# Patient Record
Sex: Female | Born: 1960 | Race: White | Hispanic: No | Smoking: Current some day smoker
Health system: Southern US, Community
[De-identification: ages and names within clinical notes are randomized; demographics above are authoritative.]

## PROBLEM LIST (undated history)

## (undated) DIAGNOSIS — F329 Major depressive disorder, single episode, unspecified: Secondary | ICD-10-CM

## (undated) DIAGNOSIS — F319 Bipolar disorder, unspecified: Secondary | ICD-10-CM

## (undated) DIAGNOSIS — Z124 Encounter for screening for malignant neoplasm of cervix: Secondary | ICD-10-CM

## (undated) DIAGNOSIS — I2699 Other pulmonary embolism without acute cor pulmonale: Secondary | ICD-10-CM

## (undated) DIAGNOSIS — Z720 Tobacco use: Secondary | ICD-10-CM

## (undated) DIAGNOSIS — F32A Depression, unspecified: Secondary | ICD-10-CM

## (undated) DIAGNOSIS — G8929 Other chronic pain: Secondary | ICD-10-CM

## (undated) DIAGNOSIS — F419 Anxiety disorder, unspecified: Secondary | ICD-10-CM

## (undated) DIAGNOSIS — M810 Age-related osteoporosis without current pathological fracture: Secondary | ICD-10-CM

## (undated) DIAGNOSIS — I639 Cerebral infarction, unspecified: Secondary | ICD-10-CM

## (undated) DIAGNOSIS — M109 Gout, unspecified: Secondary | ICD-10-CM

## (undated) DIAGNOSIS — E559 Vitamin D deficiency, unspecified: Secondary | ICD-10-CM

## (undated) HISTORY — DX: Other chronic pain: G89.29

## (undated) HISTORY — DX: Bipolar disorder, unspecified: F31.9

## (undated) HISTORY — DX: Major depressive disorder, single episode, unspecified: F32.9

## (undated) HISTORY — DX: Age-related osteoporosis without current pathological fracture: M81.0

## (undated) HISTORY — DX: Cerebral infarction, unspecified: I63.9

## (undated) HISTORY — DX: Anxiety disorder, unspecified: F41.9

## (undated) HISTORY — DX: Vitamin D deficiency, unspecified: E55.9

## (undated) HISTORY — DX: Other pulmonary embolism without acute cor pulmonale: I26.99

## (undated) HISTORY — DX: Depression, unspecified: F32.A

## (undated) HISTORY — DX: Encounter for screening for malignant neoplasm of cervix: Z12.4

## (undated) HISTORY — DX: Gout, unspecified: M10.9

## (undated) HISTORY — PX: APPENDECTOMY: SHX54

## (undated) HISTORY — DX: Tobacco use: Z72.0

---

## 1998-07-09 ENCOUNTER — Emergency Department (HOSPITAL_COMMUNITY): Admission: EM | Admit: 1998-07-09 | Discharge: 1998-07-09 | Payer: Self-pay | Admitting: Emergency Medicine

## 1998-08-12 ENCOUNTER — Emergency Department (HOSPITAL_COMMUNITY): Admission: EM | Admit: 1998-08-12 | Discharge: 1998-08-12 | Payer: Self-pay | Admitting: Emergency Medicine

## 1998-10-13 ENCOUNTER — Emergency Department (HOSPITAL_COMMUNITY): Admission: EM | Admit: 1998-10-13 | Discharge: 1998-10-13 | Payer: Self-pay | Admitting: Emergency Medicine

## 1998-11-25 HISTORY — PX: TOTAL HIP ARTHROPLASTY: SHX124

## 1998-11-27 ENCOUNTER — Emergency Department (HOSPITAL_COMMUNITY): Admission: EM | Admit: 1998-11-27 | Discharge: 1998-11-27 | Payer: Self-pay | Admitting: Emergency Medicine

## 1999-07-22 DIAGNOSIS — I639 Cerebral infarction, unspecified: Secondary | ICD-10-CM

## 1999-07-22 HISTORY — DX: Cerebral infarction, unspecified: I63.9

## 1999-10-07 ENCOUNTER — Emergency Department (HOSPITAL_COMMUNITY): Admission: EM | Admit: 1999-10-07 | Discharge: 1999-10-07 | Payer: Self-pay | Admitting: Emergency Medicine

## 2000-03-26 HISTORY — PX: PARTIAL HIP ARTHROPLASTY: SHX733

## 2001-02-18 ENCOUNTER — Emergency Department (HOSPITAL_COMMUNITY): Admission: EM | Admit: 2001-02-18 | Discharge: 2001-02-18 | Payer: Self-pay | Admitting: Emergency Medicine

## 2005-06-19 ENCOUNTER — Emergency Department (HOSPITAL_COMMUNITY): Admission: EM | Admit: 2005-06-19 | Discharge: 2005-06-19 | Payer: Self-pay | Admitting: Emergency Medicine

## 2007-07-22 ENCOUNTER — Inpatient Hospital Stay (HOSPITAL_COMMUNITY): Admission: EM | Admit: 2007-07-22 | Discharge: 2007-07-24 | Payer: Self-pay | Admitting: Emergency Medicine

## 2007-07-23 ENCOUNTER — Ambulatory Visit: Payer: Self-pay | Admitting: Physical Medicine & Rehabilitation

## 2007-07-23 ENCOUNTER — Encounter (INDEPENDENT_AMBULATORY_CARE_PROVIDER_SITE_OTHER): Payer: Self-pay | Admitting: Internal Medicine

## 2007-07-24 ENCOUNTER — Inpatient Hospital Stay (HOSPITAL_COMMUNITY)
Admission: RE | Admit: 2007-07-24 | Discharge: 2007-08-13 | Payer: Self-pay | Admitting: Physical Medicine & Rehabilitation

## 2007-07-24 ENCOUNTER — Ambulatory Visit: Payer: Self-pay | Admitting: Physical Medicine & Rehabilitation

## 2007-08-23 ENCOUNTER — Encounter: Admission: RE | Admit: 2007-08-23 | Discharge: 2007-08-23 | Payer: Self-pay | Admitting: *Deleted

## 2007-12-17 ENCOUNTER — Encounter
Admission: RE | Admit: 2007-12-17 | Discharge: 2007-12-19 | Payer: Self-pay | Admitting: Physical Medicine & Rehabilitation

## 2007-12-19 ENCOUNTER — Ambulatory Visit: Payer: Self-pay | Admitting: Physical Medicine & Rehabilitation

## 2008-01-04 ENCOUNTER — Emergency Department (HOSPITAL_COMMUNITY): Admission: EM | Admit: 2008-01-04 | Discharge: 2008-01-04 | Payer: Self-pay | Admitting: Emergency Medicine

## 2008-03-09 ENCOUNTER — Encounter: Admission: RE | Admit: 2008-03-09 | Discharge: 2008-04-15 | Payer: Self-pay | Admitting: Family Medicine

## 2008-03-10 ENCOUNTER — Ambulatory Visit (HOSPITAL_COMMUNITY): Admission: RE | Admit: 2008-03-10 | Discharge: 2008-03-10 | Payer: Self-pay | Admitting: Family Medicine

## 2008-03-10 ENCOUNTER — Ambulatory Visit: Payer: Self-pay | Admitting: Vascular Surgery

## 2008-03-10 ENCOUNTER — Encounter (INDEPENDENT_AMBULATORY_CARE_PROVIDER_SITE_OTHER): Payer: Self-pay | Admitting: Family Medicine

## 2008-03-20 ENCOUNTER — Inpatient Hospital Stay (HOSPITAL_COMMUNITY): Admission: EM | Admit: 2008-03-20 | Discharge: 2008-03-21 | Payer: Self-pay | Admitting: Emergency Medicine

## 2008-03-31 ENCOUNTER — Ambulatory Visit: Payer: Self-pay | Admitting: Vascular Surgery

## 2008-03-31 ENCOUNTER — Emergency Department (HOSPITAL_COMMUNITY): Admission: EM | Admit: 2008-03-31 | Discharge: 2008-03-31 | Payer: Self-pay | Admitting: Emergency Medicine

## 2008-04-14 ENCOUNTER — Encounter: Admission: RE | Admit: 2008-04-14 | Discharge: 2008-04-14 | Payer: Self-pay | Admitting: Family Medicine

## 2008-04-24 ENCOUNTER — Ambulatory Visit: Payer: Self-pay | Admitting: Internal Medicine

## 2008-04-24 ENCOUNTER — Encounter: Admission: RE | Admit: 2008-04-24 | Discharge: 2008-04-24 | Payer: Self-pay | Admitting: Sports Medicine

## 2008-04-24 ENCOUNTER — Inpatient Hospital Stay (HOSPITAL_COMMUNITY): Admission: AD | Admit: 2008-04-24 | Discharge: 2008-04-29 | Payer: Self-pay | Admitting: Orthopedic Surgery

## 2008-04-26 ENCOUNTER — Encounter (INDEPENDENT_AMBULATORY_CARE_PROVIDER_SITE_OTHER): Payer: Self-pay | Admitting: Orthopedic Surgery

## 2008-04-26 ENCOUNTER — Ambulatory Visit: Payer: Self-pay | Admitting: Vascular Surgery

## 2008-04-28 ENCOUNTER — Ambulatory Visit: Payer: Self-pay | Admitting: Physical Medicine & Rehabilitation

## 2008-04-29 ENCOUNTER — Inpatient Hospital Stay (HOSPITAL_COMMUNITY)
Admission: RE | Admit: 2008-04-29 | Discharge: 2008-05-18 | Payer: Self-pay | Admitting: Physical Medicine & Rehabilitation

## 2009-06-29 ENCOUNTER — Encounter (INDEPENDENT_AMBULATORY_CARE_PROVIDER_SITE_OTHER): Payer: Self-pay | Admitting: Emergency Medicine

## 2009-06-29 ENCOUNTER — Ambulatory Visit: Payer: Self-pay | Admitting: Surgery

## 2009-06-29 ENCOUNTER — Observation Stay (HOSPITAL_COMMUNITY): Admission: EM | Admit: 2009-06-29 | Discharge: 2009-06-29 | Payer: Self-pay | Admitting: Emergency Medicine

## 2009-11-16 ENCOUNTER — Ambulatory Visit: Payer: Self-pay | Admitting: Vascular Surgery

## 2010-06-24 ENCOUNTER — Ambulatory Visit: Payer: Self-pay | Admitting: Vascular Surgery

## 2010-09-10 LAB — CBC
MCV: 99.3 fL (ref 78.0–100.0)
RDW: 13.4 % (ref 11.5–15.5)
WBC: 5.9 10*3/uL (ref 4.0–10.5)

## 2010-09-10 LAB — BASIC METABOLIC PANEL
CO2: 30 mEq/L (ref 19–32)
Chloride: 103 mEq/L (ref 96–112)
Creatinine, Ser: 0.6 mg/dL (ref 0.4–1.2)
GFR calc non Af Amer: >60 mL/min (ref >60)
Potassium: 3.7 mEq/L (ref 3.5–5.1)
Sodium: 138 mEq/L (ref 135–145)

## 2010-09-10 LAB — APTT: aPTT: 41 seconds — ABNORMAL HIGH (ref 24–37)

## 2010-09-10 LAB — PROTIME-INR: INR: 1.07 (ref 0.00–1.49)

## 2010-11-08 NOTE — Op Note (Signed)
NAME:  [name] [name], [name] [name]                ACCOUNT NO.:  [id number]   MEDICAL RECORD NO.:  [id number]          PATIENT TYPE:  INP   LOCATION:  5019                         FACILITY:  [facility]   PHYSICIAN:  Dyke Brackett, M.D.    DATE OF BIRTH:  01-04-1961   DATE OF PROCEDURE:  04/24/2008  DATE OF DISCHARGE:                               OPERATIVE REPORT   INDICATIONS FOR PROCEDURE:  This is a 50 year old female with displaced  right femoral neck fracture thought to be amenable to surgery.   PREOPERATIVE DIAGNOSIS:  Displaced right femoral neck fracture.   POSTOPERATIVE DIAGNOSIS:  Displaced right femoral neck fracture.   OPERATION:  Right hip hemiarthroplasty (DePuy Prodigy 13.5 mm small  stature stem, +10 mm neck length with 45 mm hip ball).   SURGEON:  Dyke Brackett, M.D.   ASSISTANTKatrinka Blazing, PA.   BLOOD LOSS:  Approximately 150.   DESCRIPTION OF PROCEDURE:  Sterile prep and drape, posterior approach to  the hip made with splitting iliotibial band.  T capsulotomy made in the  hip.  Head was delivered, appeared to be no undue pathologic process.  The 45 mm hip ball acetabular size was judged based on sizing the head.  This was followed by progressive reaming and rasping to 13 mm to set the  13.5 mm stem with the appropriate size rasp, trialed with the rasp.  Probably due to the patient's stroke and the low nature of the fracture  relative to the neck the maximal neck length was used with good  stability with the Prodigy stem.  Final components were inserted with  the above mentioned lengths without difficulty.  There was very little  tendency to dislocate with extremes of flexion and internal rotation,  adduction with the stability was deemed to be excellent.  T capsulotomy  was closed with Ethibond, the iliotibial band with Ethibond and 2-0  Vicryl, skin clips, Marcaine with epinephrine skin lightly compressive  sterile dressing, knee immobilizer applied.  Taken to recovery room in  stable condition.      Dyke Brackett, M.D.  Electronically Signed     WDC/MEDQ  D:  04/25/2008  T:  04/25/2008  Job:  [id number]

## 2010-11-08 NOTE — Consult Note (Signed)
NAME:  [name] [name], [name] [name]                ACCOUNT NO.:  [id number]   MEDICAL RECORD NO.:  [id number]          PATIENT TYPE:  INP   LOCATION:  3002                         FACILITY:  [facility]   PHYSICIAN:  Melvyn Novas, M.D.  DATE OF BIRTH:  [birth year]   DATE OF CONSULTATION:  DATE OF DISCHARGE:                                 CONSULTATION   This patient presented to the [facility] emergency room at noontime with  a complaint of right-sided weakness.  The patient was born on [birth year].  She is a 50 year old Caucasian right-handed female appearing  older than her numeric age with a recorded history of bipolar disorder,  but not on current meds.  There is also a history of alcohol abuse and  multi-substance abuse and the patient states that she has been clean and  clear of any substances, but she is an active smoker.  The patient awoke  with a right-sided deficit this morning and remembers last having walked  normally around 10:00 or 11:00 p.m. this evening before to the bathroom.  She was able to make a fist and called for her mom to help her out of  bed and to take her to the bathroom after she had made an unsuccessful  attempt to stand up.  She sat on the floor because she had the sensation  that she otherwise would fall; however all this happened at 9:30 and the  patient did not arrive at the emergency department until about 12:35.  She states that her deficit improved while she was seen in the ER, but  since the onset time was not defined, the patient was not considered to  be a TPA candidate.  Her symptoms also were waxing and waning and there  is a question of embellishment.  The patient states that she has no  prior stroke history and has no associated symptoms such as vertigo and  nausea.  She strictly said she has right-sided weakness.   ALLERGIES:  SHE IS ALLERGIC TO ATIVAN.   MEDICATIONS:  At home she takes multivitamin, calcium, melatonin,  Flexeril and hydroxyzine for  anxiety   She herself denies that she is bipolar and she states she is status-post  left hip replacement in June 2008 after a fall. Her substance history is  continuous active smoker of a half-pack per day, history of cocaine,  Ecstasy, marijuana, alcohol.  Again she said she quit all this in  October 2008.   SOCIAL HISTORY:  Lives with her mother in [place].  She is applying  for disability.   FAMILY HISTORY:  Mother is alive and actually very physically and  cognitively spry at 29.  Father died of 65 with lung cancer.   REVIEW OF SYSTEMS:  The patient denies headache, nausea, vertigo,  diarrhea or chest pain, palpitations, dizziness or dysuria.   VITAL SIGNS:  Temperature is 97.0, blood pressure 100/60, pulse rate is  59, respiratory rate is 19.  The patient has an oxygen saturation on 2  liters of nasal cannula of 100%.  GENERAL:  She is appearing slightly giddy.  Her mother is in the room  and the patient appears completely unconcerned about the right-sided  deficits in her.  NEUROLOGICAL:  There is no facial droop, but the patient states that her  complete right face including the forehead is numb to touch.  Extraocular movements were intact and the patient's visual fields appear  intact.  She has midline tongue and uvula movements.  She has symmetric  grin and eye closure.  Shoulder shrug is weaker on the right.  The  patient makes an attempt to lift her right arm up from the bed, but it  shows a drift and a complete droop, so her antigravity movement is  impaired.  She can also not resist with the upper extremity.  She does  not provide a grip strength on the right. Her deep tendon reflexes left-  sided present and right-sided not.  The lower extremity; however is  different. The patient has been able to lift her right lower extremity  just as her left for about 5 seconds off the mattress, which shows a  mild drift, but not a droop and then when testing resistance she  applies  actively resistance when I asked her to lift her leg and push downward.  A Babinski response was on both sides positive.  COORDINATION:  She  cannot perform finger to nose or heel to shin on the right.  Sensation  is impaired throughout the right body and the patient's lips.  Her  sensation strictly at midline.  Again she is also including her forehead  and her facial numbness.  Gait was not tested and the patient states  that she has fallen at home prior to being brought to the hospital.   CT of the head here was read as normal, but she has remote bilateral  lacunar strokes and infarct.   Amylase is normal.  Lipase is normal.  Sodium 138, potassium 4.4,  glucose is 80, AST is 26, ALT 21, H&H is 15.2/45.6.   ASSESSMENT:  This 50 year old female awoke with a pattern of right-sided  weakness, as well as numbness, which also involved the face and the body  and has upgoing Babinski responses.  Some of her physical exam results  appear to be embellished, others are believable.  At this time I would  like for this patient to have an MRI obtained to see if an acute stroke  happened; however she is out of the window for any TPA treatment and has  not been on any aspirin before.  Given her history of substance abuse  old lacunes can be explained by her cocaine and Ecstasy abuse.  A tox  screen would still be well-advised to make sure that the patient is not  again abusing substances.  We ordered besides the MRI, carotid Dopplers  and the stroke panel.  We will add aspirin to her medication mix.   I thank Dr. Ramiro Harvest for this consultation.  The patient will be  followed up depending on her MRI results by the stroke service or by the  primary neurology service.      Melvyn Novas, M.D.  Electronically Signed     CD/MEDQ  D:  07/23/2007  T:  07/23/2007  Job:  [id number]   cc:   Ramiro Harvest, MD

## 2010-11-08 NOTE — Discharge Summary (Signed)
NAME:  [name] [name], [name] [name]                ACCOUNT NO.:  [id number]   MEDICAL RECORD NO.:  [id number]          PATIENT TYPE:  IPS   LOCATION:  4033                         FACILITY:  [facility]   PHYSICIAN:  Greg Cutter, P.A. DATE OF BIRTH:  [birth year]   DATE OF ADMISSION:  07/24/2007  DATE OF DISCHARGE:  08/13/2007                               DISCHARGE SUMMARY   DISCHARGE DIAGNOSES:  1. 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  2. Bipolar disorder.  3. Abnormal liver function tests.  4. History of polysubstance abuse.  5. Gout, new diagnosis.   HISTORY OF PRESENT ILLNESS:  [name] [name] is a 50 year old female with a  history of bipolar disorder, polysubstance abuse, admitted January 26  with right-sided weakness and numbness.  CT of head done was negative.  MRI and MRA of brain and neck showed small moderate non-hemorrhagic  infarct in left lenticular nucleus, left corona radiata, old infarcts in  bilateral thalamic and left caudate region, nonspecific white matter  changes.  No stenosis noted.  Intracranial atherosclerosis noted.  2-D  echo done showed an EF of 65%.  No wall abnormality.  Carotid Dopplers  done showed no ICA stenosis.  Currently, the patient continues with  right hemiparesis with decrease in sensation on right side and is noted  to have problems with right knee instability with tendency for knee to  buckle.  Has been able to take a few steps to the chair.  Rehab  consulted for further therapies.   PAST MEDICAL HISTORY:  1. Bipolar disorder.  2. Polysubstance abuse.  3. 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  4. Anxiety disorder.  5. History of alcohol withdrawal seizures.  6. Appendectomy.  7. History of abnormal LFTs.   ALLERGIES:  ATIVAN.   FAMILY HISTORY:  Positive for TB and cancer.   SOCIAL HISTORY:  The patient lived in [place] and in the last year  has moved to [place] this month,  January 2009, to live with mother  and sister.  Lives in a 2-level home with 4 steps at entry.  Tobacco:  Smokes 1 pack every 2 days.  Quit alcohol in October 2008.  Has a  history of cocaine and ecstasy use, quit in 2002.  Continues to use  occasional marijuana.   HOSPITAL COURSE:  [name] [name] was admitted to rehab on July 24, 2007 for inpatient therapies to consist of PT and OT daily.  At time of  admission the patient reported depressive-type symptoms secondary to  recent stroke event.  She was started on Paxil for mood stabilization.  The patient was noted to have problems with dermatitis with itching of  her back and her upper extremities.  Hydrocortisone cream p.r.n. and  Benadryl are being used during this stay.  The patient is noted to have  some tone in the right upper extremity and was started on low-dose  baclofen.  She has had complaints of right knee pain greater than left.  Reports problems with bilateral knee pain.  However, no x-rays  or workup  in the past.  X-rays of the right knee revealed a chronic finding with  last joint effusion, no fracture or dislocation, and calcified densities  in deep patellae adjusted to articular surface with chondrocalcinosis in  the lateral compartment.  X-rays of the left knee showed suprapatellar  joint effusion without any fracture or dislocation.  Dr. Luiz Blare of  Orthopedics was consulted for input in a patient with continued  complaints of pain in bilateral knee despite use of Flexeril and  oxycodone.  Right knee effusion was aspirated by Dr. Luiz Blare on February  5.  This was also injected with cortisone.  Fluid from left knee was  sent for culture.  This was positive for calcium pyrophosphate crystals.  Her left knee effusion had resolved by February 10.  The patient was  started on ibuprofen 400 mg p.o. t.i.d. as well as colchicine 0.6 mg per  day to help with pain management.   The patient had labs done past admission.  This  revealed hemoglobin  13.3, hematocrit 39.4, white count 5.7, platelets 202.  Check of lytes  revealed sodium of 138, potassium 3.5, chloride 106, CO2 25, BUN 13,  creatinine 0.66, glucose 91.  LFTs revealed SGOT 18, SGPT 21, alkaline  phos 78, T bili 0.5.  The patient's blood pressures were monitored on a  b.i.d. basis during this stay.  These have ranged from 90s to low 100s  in systolic, 60s to 56L diastolic.  Last weight is at 58 kg.  PT and OT  has been ongoing during this stay.  Speech therapy evaluated the patient  and has worked with the patient for high level of functioning and for  working on understanding of complex tasks.  Currently, the patient has  progressed to being at modified independent level, to demonstrate  complex problem-solving, as well as anticipate and self-correct for  physical limitations.  OT has been working with the patient in terms of  self-care.  They have been focusing on use of right upper extremity with  bathing and dressing to help with independence.  Currently the patient  is at min assist for transfers and for dynamic standing balance.  She  continues to require verbal cues to increase weight shifting on right  side as well as cuing for thoracic posture.  Currently, the patient is  at min assist for transfers, able to ambulate 80 feet with min assist  with use of rolling walker and right AFO.  The patient will continue to  require assistance post discharge and family has elected on SNF.  Bed is  available through [facility] for February 17 and the patient to be  discharged to this facility with progressive PT/OT to continue past  discharge.   DISCHARGE MEDICATIONS:  1. Coated aspirin 325 mg a day.  2. Nicotine patch 14 mcg an hour, change daily.  3. Protonix 40 mg a day.  4. Paxil 10 mg a day.  5. Hydrocortisone cream 1% b.i.d. p.r.n. for itching.  6. Multivitamin 1 p.o. per day.  7. Seroquel 50 mg p.o. q.h.s.  8. Lactulose b.i.d. and  p.r.n. Senokot-S 2 p.o. q.h.s.  9. Baclofen 5 mg p.o. q.h.s.  10.Colchicine 0.6 mg p.o. b.i.d.  11.Ibuprofen 400 mg p.o. t.i.d.  12.Oxycodone IR 10 mg p.o. prior to therapy 7 a.m. and at noon, 10 mg      to be used p.r.n. on 8 to 12 hours additionally.  13.Tylenol 650 mg p.o. every 4-to-6 hours p.r.n.  pain.   DIET:  Regular.   ACTIVITY LEVEL:  With 24-hour supervision with assistance as needed.   SPECIAL INSTRUCTIONS:  Progressive PT/OT to continue past discharge.  Use right AFO whenever ambulating.  Routine pressure relief measures.  Routine check of lytes while on NSAIDs.   FOLLOW UP:  1. The patient is to follow up with Dr. Thomasena Edis in 6 weeks.  2. Follow up with Dr. Luiz Blare for recurrence of further pain of      bilateral knees.  3. Follow up with Dr. Pearlean Brownie in 1 month.      Greg Cutter, P.A.     PP/MEDQ  D:  08/12/2007  T:  08/13/2007  Job:  [id number]   cc:   Pramod P. Pearlean Brownie, MD  Harvie Junior, M.D.  Dr. Thomasena Edis

## 2010-11-08 NOTE — H&P (Signed)
NAME:  [name], [name]                ACCOUNT NO.:  [id number]   MEDICAL RECORD NO.:  [id number]          PATIENT TYPE:  IPS   LOCATION:  4009                         FACILITY:  [facility]   PHYSICIAN:  [name] [name], M.D.DATE OF BIRTH:  25-Dec-1960   DATE OF ADMISSION:  04/29/2008  DATE OF DISCHARGE:                              HISTORY & PHYSICAL   CHIEF COMPLAINT:  Right hip pain and residual right hemiparesis.   HISTORY OF PRESENT ILLNESS:  This is a 50 year old white female with  bipolar disorder and a prior stroke with residual right hemiparesis from  January 2009.  She had a prior left hip fracture in June 2008 with an  apparent hip replacement.  She has been at an assisted living facility  prior to coming in.  She had a fall there sustaining a right femoral  neck fracture and underwent a subsequent right hip hemiarthroplasty on  April 25, 2008, by Dr. Madelon [name].  Apparently, she fell a few weeks  prior and fractured the right wrist, although we have no details of  this.  She is wearing a cast currently.  The patient is weightbearing as  tolerated in right lower extremity with posterior hip precautions.  She  is placed on subcu Lovenox for DVT prophylaxis.  Dopplers negative for  DVT.  The patient is weightbearing as tolerated with her cast on the  right upper extremity.  The patient continued to struggle with mobility  and self-care.  Rehab was consulted for inpatient rehab evaluation and  felt that she could benefit from rehab admission.  The patient was  admitted today.   REVIEW OF SYSTEMS:  Notable for weakness and reflux.  She had fair  appetite.  Mental status seems to be improving.  Other pertinent  positives are above and full review is in the written health history  section.   PAST MEDICAL HISTORY:  Positive bipolar disorder, stroke in January 2009  with residual right hemiparesis, gout, and left hip fracture in June  2008.  The patient's history is positive for  alcohol and tobacco use.   FAMILY HISTORY:  Noncontributory.   SOCIAL HISTORY:  The patient is a resident of [facility].  She needs assistance with toileting and dressing prior to  arrival.  She had been getting to around the room and the cafeteria with  a wheelchair and walker prior to her recent maladies.   FUNCTIONAL HISTORY:  The patient previously was more or less in a  wheelchair level and min assist to occasional supervision.  As of  evaluation yesterday, the patient was total assist for basic mobility  and self-care.  Today, she still was generally total assist with  occasional max assist for mobility and self-care.   ALLERGIES:  Ativan and Paxil.   MEDICATIONS:  Aspirin, multivitamin, vitamin D, Os-Cal, Motrin,  colchicine, nifedipine, Cymbalta, Zanaflex, and Xanax.   LABS:  Hemoglobin 10.3, white count 5.4, platelets 205, sodium 133,  potassium 4.3, BUN and creatinine 7 and 0.4.   PHYSICAL EXAMINATION:  VITAL SIGNS:  Blood pressure 102/67, pulse is  101, respiratory rate 18, and temperature 97.3.  GENERAL:  The patient is pleasant, alert, and oriented x3.  It is  difficult to keep on tasks, but otherwise appropriate.  HEENT:  Ears, nose, and throat notable for fair-to-borderline dentition  and pink moist mucosa.  NECK:  Generally supple without JVD or lymphadenopathy.  CHEST:  Clear to auscultation bilaterally without wheezes, rales, or  rhonchi.  HEART:  Regular rate and rhythm without murmur, rubs, or gallops.  ABDOMEN:  Soft and nontender.  Bowel sounds are positive.  SKIN:  Notable for blotching and mottled skin color in legs and arms.  The right face had a few abrasions in areas that appeared slightly  excoriated.  NEUROLOGIC:  Cranial nerves II-XII revealed right VII and tongue  deviation.  Reflexes are a bit brisk on the right at 2++.  Sensation is  a bit decreased to pinprick and light touch in the right, but  inconsistently so.   Strength was 2/5 right upper extremity to 3/5.  She  had difficulty with finger and wrist extension in the right hand as well  as left hand today in fact.  Lower extremity strength was 1-2/5  proximally to 2+/5 distally.  She has some tightness with heel cord  stretching today on the right.  Left side lower extremity was 3+ to 4/5.  The patient is wearing a cast over the right forearm and wrist.  Right  hip wound is clean, dry, and intact.   ASSESSMENT AND PLAN:  1. Functional deficits secondary to right femoral neck fracture in the      setting of prior left-sided cerebrovascular accident with residual      right hemiparesis.  The patient also has a right wrist injury of      unclear origin.  The patient is admitted to the inpatient rehab      unit today to receive collaborative interdisciplinary care between      the physiatrist, rehab nursing staff, and therapy team.  The      patient's level of medical complexity and substantial needs in      context of the medical necessity cannot be provided at a lesser      intensity of care.  Physiatrist will provide 24-hour management of      medical needs as well as oversight of therapy plan/treatment and      provide guidance as appropriate regarding interaction of the 2.  24-      hour rehab nursing will assist to manage the patient's skin care      needs as well as nutrition, pain management, safety, and also      integrate therapy concepts, techniques, and education.  PT will      assess and treat for mobility needs including transfers and      wheelchair mobility.  It is doubtful that the patient will have any      gait goals due to prior deficits and new hip fracture as well as      safety risks involved.  We also will need to integrate safety      techniques and better awareness and insight.  OT will assess and      treat for upper extremity use in the context of the above and      particularly with her right forearm splint in place.   She will need      further cognitive/perceptual training and work on self-care ADLs      and  caregiver education.  Speech language pathology will assess and      treat for cognitive needs.  Case management/social worker will      assess for psychosocial issues and discharge planning.  Team      conferences will be held weekly to establish goals, assess      progress, and determine barriers of discharge.  The patient will      receive at least 3 hours of therapy per day at least 5 days per      week.  Rehab goals are min assist more likely with PT/OT.      Estimated length of stay is 2-3 weeks.  Prognosis fair to good.  2. Right upper extremity cast.  The patient with apparent negative x-      ray previously.  Will need attempt to follow up with Orthopedics      regarding the plan and issues here.  The patient states that she      had a fracture.  3. Bipolar disorder:  Continue Cymbalta.  The patient is behaving      appropriately at this point.  We will follow for behavior pattern,      sleep cycle, etc.  4. Blood pressure:  Continue Procardia.  5. Gout:  Continue colchicine  b.i.d.  The patient has commmonly had      flares in the past.  6. Deep vein thrombosis prophylaxis, subcu Lovenox.  7. Wound:  Continue Mepilex to leg every 1-3 days as needed.      [name] [name], M.D.  Electronically Signed     ZTS/MEDQ  D:  04/29/2008  T:  04/30/2008  Job:  [id number]

## 2010-11-08 NOTE — Discharge Summary (Signed)
NAME:  [name] [name], [name] [name]                ACCOUNT NO.:  [id number]   MEDICAL RECORD NO.:  [id number]          PATIENT TYPE:  INP   LOCATION:  3002                         FACILITY:  [facility]   PHYSICIAN:  Pramod P. Pearlean Brownie, MD    DATE OF BIRTH:  03/31/1961   DATE OF ADMISSION:  07/22/2007  DATE OF DISCHARGE:  07/24/2007                               DISCHARGE SUMMARY   DIAGNOSES AT TIME OF DISCHARGE:  1. 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  2. Cigarette smoker.  3. Bipolar disorder.  4. Anxiety.  5. Cigarette smoker.  6. History of cocaine, ecstasy, alcohol abuse and withdrawal, current      marijuana use.  7. 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   MEDICINES AT TIME OF DISCHARGE:  1. Multivitamin a day.  2. Calcium a day.  3. Aspirin 325 mg a day.  4. Lovenox 40 mg subcu a day.  5. Protonix 40 mg a day.  6. Nicotine patch 14 mg a day.   STUDIES PERFORMED:  1. CT of the brain on admission shows no acute intracranial      abnormality.  Remote to PA and bilateral lacunar infarct.  2. Chest x-ray is normal.  No active disease.  3. MRI of the brain shows acute small slightly moderate-sized      nonhemorrhagic infarct extending from the posterior left lenticular      nucleus to the posterior aspect of the left corona radiata.  Old      small infarcts thalami and left periatrial region adjacent to the      left caudate head.  Mild to moderate nonspecific white matter type      changes probably related to small-vessel disease.  Mucosal      thickening maxillary sinuses and partial opacification inferior      left mastoid air cells.  4. MRA of the neck shows no significant stenosis in either carotid      bifurcation.  Slight loss of signal right vertebral artery at C1-2      level, likely artifact.  5. MRA of the head shows intracranial atherosclerotic type changes,      most prominent involving the branch vessels.  6. A 2-D echocardiogram shows EF of  65% with no left ventricular      regional wall motion abnormalities.  No embolic source.  7. Carotid Doppler shows no ICA stenosis.  8. Transcranial Doppler performed, results pending.  9. EKG shows normal sinus rhythm with sinus arrhythmia, rightward      axis, interior infarct age undetermined.   LABORATORY STUDIES:  CBC with hemoglobin 15.2, otherwise normal.  Chemistry normal.  Coagulation studies normal.  Liver function tests  normal.  Amylase and lipase normal.  Cardiac enzymes negative x3.  Cholesterol 151, triglycerides 51, HDL 45, LDL 96.  GC/CT genital probe  is pending.  RPR is nonreactive.  Homocystine 7.3.  Urine drug screen  negative.  Alcohol level less than five.  Pregnancy test is negative.  Hemoglobin A1c was not drawn.   HISTORY OF PRESENT ILLNESS:  [name]  is a 50 year old female with  history of alcohol abuse, bipolar disorder, tobacco abuse, who presents  to the ED with a right-sided hematemesis and numbness and right facial  numbness that occurred upon awakening.  The patient states she went to  bed at 2:30 a.m. on the day of admission in a normal state and woke at  9:30 a.m. on the day of admission with symptoms of right-sided weakness  and numbness and right facial numbness.  She denies facial asymmetry,  slurred speech, visual changes, aphasia, dysarthria or other symptoms.  She did say she was unable to walk without assistance and her mother had  to help her crawl back from the bathroom.  Her mother called her primary  care physician, and she was instructed to come to the emergency  department where CT of the brain was negative for acute abnormality.  [facility] hospitalist was called and they admitted her.   HOSPITAL COURSE:  Dr. Pearlean Brownie took Ms. Badalamenti on to the stroke service  during the first hospital day.  Her MRI was positive for an acute left  corona radiata infarct that was subcortical and secondary to small-  vessel disease.  She was not on aspirin  prior to admission, and aspirin  was added.  She has been advised to stop smoking to decrease her risk of  stroke.  She was evaluated by PT and OT and felt to benefit from  inpatient rehab.  Her mother is supportive at home, and she will have  care at time of discharge from rehab.  The patient medically stable and  ready for transfer.   CONDITION ON DISCHARGE:  The patient alert and oriented x3.  No aphasia.  No dysarthria, right facial weakness, right hematemesis, 3/5 distal hand  and foot weakness, impaired finger-nose-finger and heel-shin on the  right, decreased sensation on the right.   DISCHARGE/PLAN:  1. Transfer to rehab, continuation of PT, OT and speech therapy.  2. Aspirin for secondary stroke prevention.  3. Ongoing risk factor control by primary care physician Dr. Dorothe Pea.      Please follow up with him one month after discharge from rehab.      Follow up Dr. Delia Heady in 2-3 months.      Annie Main, N.P.    ______________________________  Sunny Schlein. Pearlean Brownie, MD    SB/MEDQ  D:  07/24/2007  T:  07/24/2007  Job:  [id number]   cc:   Pramod P. Pearlean Brownie, MD  Jethro Bastos, M.D.

## 2010-11-08 NOTE — Assessment & Plan Note (Signed)
OFFICE VISIT   [name] [name]  DOB:  [birth year]                                       06/24/2010  CHART#:[id number]   HISTORY OF PRESENT ILLNESS:  Patient is a 50 year old woman with a  history of left brain stroke and right hemiparesis who spends anywhere  from 6-12 hours in a wheelchair at the nursing facility and has  developed consistent swelling in the right lower extremity as well as  some erythema and cyanosis of the toes.  She denies any night pain or  rest pain.  She does not ambulate so it is difficult to know whether she  has claudication symptoms.  She states that the color and swelling  improve vastly overnight with her leg elevated.  The patient also states  that up until 2 years ago, she was wearing compression stockings on that  foot for a similar-type swelling.  She was seen by [place] in the past for a  nonhealing bug bite on the third toe of the right foot.  This is well-  healed at this point.  The patient is here for evaluation with ABIs.   PHYSICAL EXAMINATION:  This is a woman who has a right hemiparesis.  She  is alert and oriented.  Her heart rate was 72.  Saturations were 96%.  Her respiratory rate was 10.  The right lower extremity:  She had 2+  pitting edema from the knee down.  She also had some cyanosis in her  toes.   She had ABIs done today which showed 0.95 on the right and 1.17 on the  left.  She had some erythema in the calf area and some cyanosis of the  toes.  This color improved, however, to more normal with elevation.  She  had no calf tenderness.  She also had a palpable DP pulse on the right  as well.  She states that the swelling has been the same over the last 2  years.   ASSESSMENT:  Swelling in this woman with right hemiplegia, new venous  stasis issues on the right lower extremity.   PLAN:  Have her elevate her leg as much as possible.  She was given also  a prescription for compression stockings for the right  leg to be worn  during the day and off at night.  She is also given some exercises to  help reduce the swelling and edema in the leg.  She will follow up as  needed for pain.   Della Goo, PA-C   Fransisco Hertz, MD  Electronically Signed   RR/MEDQ  D:  06/24/2010  T:  06/24/2010  Job:  [id number]

## 2010-11-08 NOTE — Consult Note (Signed)
VASCULAR SURGERY CONSULTATION   [name] [name], [name] [name]  DOB:  04/11/1961                                       03/31/2008  CHART#:[id number]   The patient was referred for vascular surgery consultation by Dr.  Dorothe Pea with a complaint of cold discolored feet.  This 50 year old  patient suffered a stroke (left brain) over the last several months and  this left her with a right hemiparesis.  She is in a wheelchair at the  present time, but is getting some reasonable return of function on the  right side.  She apparently has been noticing that her feet feel cold  and are slightly discolored, particularly on the right side.  She had  lower extremity arterial Dopplers performed at [facility] 1 week ago,  which revealed normal arterial pressures in both feet with normal wave  forms.  She is unable to ambulate because of the previous stroke,  although she is improving.  She fell out of her wheelchair a few days  ago and contused her face in the right periorbital area and injured her  wrists.   PAST MEDICAL HISTORY:  1. Previous left brain stroke.  2. Negative for coronary artery disease, diabetes, myocardial      infarction, hyperlipidemia, COPD.   PREVIOUS SURGERY:  1. Appendectomy.  2. Drainage of left leg hematoma.  3. Left hip replacement.   FAMILY HISTORY:  Negative for coronary artery disease, diabetes, or  stroke.   SOCIAL HISTORY:  She is single and smokes 1 pack of cigarettes per day.  Has been drinking up to a bottle of wine per day, but is trying to stop.   ALLERGIES:  Ativan.   PHYSICAL EXAM:  Blood pressure 123/78, heart rate is 113, respirations  14.  Generally, she is chronically ill-appearing female with an obvious  right periorbital hematoma and bruising with a laceration to the right  forehead.  She is alert and oriented x3.  Neck is supple.  3+ carotid  pulses palpable.  No bruits are audible.  Chest is clear to  auscultation.  Cardiovascular  exam is regular rhythm with no murmurs.  Abdomen is soft and nontender with no masses.  She has 3+ femoral,  popliteal, and 2+ dorsalis pedis pulses bilaterally.  Right foot is  slightly bluish in discoloration compared to the left, but does have  capillary refill with no tenderness or areas of ischemia.  No venous  disease is noted.   Lower extremity arterial Dopplers at [facility] were normal within  the last week.   I do not see any evidence of arterial insufficiency to account for her  symptoms.  I would not recommend any further evaluation of her arterial  system of the lower extremities at this time.  She will return to see [place]  on a p.r.n. basis.   Quita Skye Hart [place], M.D.  Electronically Signed  JDL/MEDQ  D:  03/31/2008  T:  04/01/2008  Job:  [id number]   cc:   Jethro Bastos, M.D.

## 2010-11-08 NOTE — Assessment & Plan Note (Signed)
HISTORY OF PRESENT ILLNESS:  [name] [name] returns to the clinic today  for followup evaluation.  She is a 50 year old female with a history of  bipolar disorder, and polysubstance abuse.  She was admitted on July 22, 2007 with right-sided weakness and numbness.  CT of the brain was  negative.  MRI and MRA studies of the brain showed moderate non-  hemorrhagic infarct in left ventricular nucleus and left corona radiata  with old infarcts in the bilateral thalamic and left caudate regions.  There were nonspecific white matter changes noted.  Intracranial  atherosclerosis was noted.  A 2-D echocardiogram showed an ejection  fraction of 65%.  No valve abnormalities were identified.  Carotid  Doppler showed no internal carotid artery stenosis.   The patient was on the rehabilitation unit between July 24, 2007 and  August 13, 2007.   Since discharge, the patient has been living at [facility] and  receiving therapies there.  She was referred back to this office by the  physicians at [facility] for management of her chronic pain.  The  patient denies having any pain prior to this stroke, but now reports  pain involving mostly her entire right side.  She complains of pain in  her biceps, triceps, and right leg.  She has been prescribed a  combination of oxycodone extended release and oxycodone immediate  release, but reports that she has developed some intolerance.  She is  also complaining of some spasticity in her right side.  She complains of  only minimal pain in her left knee region.  She also has had history of  gouty pain involving both knees.   PAST MEDICAL HISTORY:  1. History of bipolar disorder.  2. History of polysubstance abuse.  3. Gout.  4. Abnormal liver function tests.  5. Left total hip replacement secondary to fracture.  6. Alcohol withdrawal seizures.  7. Prior appendectomy.  8. Depression.  9. Bilateral knee pain.   ALLERGIES:  No known drug  allergies.   FAMILY HISTORY:  Positive for tuberculosis.   MEDICATIONS:  1. Colchicine 0.6 mg b.i.d.  2. Cymbalta 20 mg 2 tablets daily.  3. Os-Cal 500 mg 3 tablets daily.  4. Paroxetine 20 mg 1 tablet daily.  5. Vitamin D 800 International Units daily.  6. Multivitamin daily.  7. Prilosec 20 mg daily.  8. Enteric-coated aspirin 81 mg.  9. Oxycodone immediate release 10 mg q.4 h. p.r.n.  10.Tylenol p.r.n.  11.Lactulose p.r.n.  12.Senokot p.r.n.  13.Baclofen 10 mg t.i.d.  14.Ibuprofen 400 mg t.i.d.  15.Oxycodone extended release 10 mg q.12 h.   REVIEW OF SYSTEMS:  Positive for skin rash, diarrhea, and limb swelling.   PHYSICAL EXAMINATION:  GENERAL:  Well-appearing, middle-aged adult  female seated in manual chair.  VITAL SIGNS:  Blood pressure 93/52 with a pulse of 86, respiratory rate  18, and O2 saturation 98% on room air.  EXTREMITIES:  She has 5/5 strength in the left upper and left lower  extremities.  Right upper extremity strength was generally 3-/5 and  right lower extremity strength was 3+/5.  She has proximally normal  sensation in the right upper and right lower extremity with complaints  of tightness in the right elbow, especially at the biceps and triceps.  She also had some complaints of tightness in the medial and lateral  thigh region.   IMPRESSION:  1. Status post left ventricular corona radiata infarct with right      hemiparesis and  right facial weakness.  2. History of bipolar disorder.  3. History of polysubstance abuse.  4. Gout.   PLAN:  In the office today, we did plan an adjustment of her medication.  We recommended increasing her colchicine to 0.6 mg t.i.d.  We also asked  to start her on Flexeril 10 mg p.o. nightly and to discontinue baclofen.  Instead, we will use some Zanaflex 4 mg twice a day.  We also increased  her oxycodone sustained release to 20 mg q.12 h. and asked her to  decrease her oxycodone immediate release to 10 mg 1 q.5-6 h.   We will  plan on seeing her in followup at approximately 1-2 months with  adjustment of medication at this time.           ______________________________  Ellwood Dense, M.D.     DC/MedQ  D:  12/19/2007 12:05:06  T:  12/20/2007 02:48:01  Job #:  [id number]

## 2010-11-08 NOTE — Op Note (Signed)
NAME:  Cousin, [name]                ACCOUNT NO.:  [id number]   MEDICAL RECORD NO.:  [id number]          PATIENT TYPE:  INP   LOCATION:  5154                         FACILITY:  [facility]   PHYSICIAN:  Gabrielle Dare. Janee Morn, M.D.DATE OF BIRTH:  1961-02-19   DATE OF PROCEDURE:  03/20/2008  DATE OF DISCHARGE:                               OPERATIVE REPORT   PROCEDURE:  Fourier-acquired steady-state technique ultrasound note.   PREOPERATIVE DIAGNOSIS:  Altered mental status, status post fall.   POSTOPERATIVE DIAGNOSES:  1. Altered mental status, status post fall.  2. No free fluid or pericardial effusions.   PROCEDURE IN DETAIL:  [name] [name] is a 50 year old, who is being  evaluated by the Trauma Service for altered mental status and forehead  laceration.  FAST ultrasound was done.  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  Epigastric region was visualized showing the  pericardial region.  There was no pericardial effusion noted.  Left  upper quadrant was also visualized well.  There was no evidence of fluid  between the spleen and the left kidney.  Pelvic view revealed bladder  with good urinary distention and no free fluid.   IMPRESSION:  No free fluid and no pericardial effusions.      Gabrielle Dare Janee Morn, M.D.  Electronically Signed     BET/MEDQ  D:  03/20/2008  T:  03/21/2008  Job:  [id number]

## 2010-11-08 NOTE — Op Note (Signed)
NAME:  [name] [name], [name] [name]                ACCOUNT NO.:  [id number]   MEDICAL RECORD NO.:  [id number]          PATIENT TYPE:  INP   LOCATION:  5154                         FACILITY:  [facility]   PHYSICIAN:  Gabrielle Dare. Janee Morn, M.D.DATE OF BIRTH:  1961-06-06   DATE OF PROCEDURE:  DATE OF DISCHARGE:                               OPERATIVE REPORT   PREOPERATIVE DIAGNOSIS:  Forehead laceration.   POSTOPERATIVE DIAGNOSIS:  Forehead laceration.   PROCEDURE:  Simple closure, 2-cm forehead laceration.   SURGEON:  Gabrielle Dare. Janee Morn, M.D.   HISTORY OF PRESENT ILLNESS:  [name] is a 50 year old female who  came in with altered mental status and alcohol intoxication.  She also  has a 2-cm left forehead laceration.   PROCEDURE IN DETAIL:  The area was prepped in a sterile fashion.  A 2-cm  forehead laceration was cleaned and then closed in a simple manner with  a Dermabond.  Excellent closure was achieved and she tolerated this  well.      Gabrielle Dare Janee Morn, M.D.  Electronically Signed     BET/MEDQ  D:  03/20/2008  T:  03/21/2008  Job:  [id number]

## 2010-11-08 NOTE — Consult Note (Signed)
NAMEMirjana [name], Merril                ACCOUNT NO.:  [id number]   MEDICAL RECORD NO.:  [id number]          PATIENT TYPE:  INP   LOCATION:  5019                         FACILITY:  [facility]   PHYSICIAN:  Therisa Doyne, MD    DATE OF BIRTH:  09/18/1960   DATE OF CONSULTATION:  04/24/2008  DATE OF DISCHARGE:                                 CONSULTATION   PRIMARY CARE [name] [name]:  [name] [name], M.D. of [facility].   CHIEF COMPLAINT:  Cardiac risk stratification prior to hip fracture  surgery.   HISTORY OF PRESENT ILLNESS:  A 50 year old white female with a past  medical history significant for cerebrovascular accident with residual  right hemiparesis as well as heavy alcohol use, who presents after  having a hip fracture.  The patient reported she had a mechanical fall 2  days ago.  She states that she had gotten up from sleep to use the  toilet.  After standing up from using the toilet, she tripped on a  wheelchair and subsequently fell down.  This appears to be a mechanical  fall.  There was no syncope or loss of consciousness.  There were no  preceding palpitations or chest pain.  The patient had some mild pain  for the next 2 days and then came to the emergency room where she was  found to have a hip fracture.  Of note over the past 1 week, the patient  has reported increased urinary frequency as well as foul-smelling urine.  Otherwise, she denies specific complaints.   In talking with her about her functional status, she is able to ambulate  with a walker.  She is able to ambulate approximately 100 feet without  any chest pain, angina or dyspnea on exertion.  She denies any heart  failure symptoms, specifically denying lower extremity edema, orthopnea  or PND.   REVIEW OF SYSTEMS:  All systems were reviewed and are negative except as  mentioned above in the history of present illness.   PAST MEDICAL HISTORY:  1. Heavy alcohol abuse.  2. Bipolar disorder.  3. Cerebrovascular accident with residual right hemiparesis in January      2009, unclear etiology for the stroke.  4. Gout.  5. Left hip fracture, status post repair in June 2008.   SOCIAL HISTORY:  The patient lives in an assisted living facility.  She  does smoke 1 pack per day for the past 25 years.  She has a history of  heavy alcohol use as well as a history of alcohol withdrawal seizures.  Currently, she is still drinking.  However, she cannot tell me how much  she usually drinks.  It is unclear when her last alcoholic drink was,  but she thinks it was approximately 1-2 days ago.   FAMILY HISTORY:  No other history of coronary artery disease.   MEDICATIONS:  1. Aspirin 81 mg daily.  2. Centrum daily.  3. Vitamin D 400 units daily.  4. Os-Cal 500 plus vitamin D t.i.d.  5. Motrin 400 mg t.i.d.  6. Colchicine 0.6 mg b.i.d.  7. Nifedipine 10 mg t.i.d.  8. Compression hose stockings.  9. Cymbalta 60 mg daily.  10.Zanaflex 4 mg b.i.d.  11.Flexeril 20 mg q.6 h. p.r.n.  12.Xanax 0.5 mg q.4 h. p.r.n.  13.Biotin t.i.d.   ALLERGIES:  1. PAXIL.  2. ATIVAN.   PHYSICAL EXAMINATION:  VITAL SIGNS:  Temperature 97, blood pressure  105/76, pulse 82, respirations 18.  GENERAL:  No acute distress.  The patient appears older than stated age.  HEENT:  Normocephalic, atraumatic.  Pupils are equal, round, and  reactive to light and accommodation.  Extraocular movements are intact.  NECK:  Supple.  No lymphadenopathy.  No jugular venous distention.  No  masses.  CARDIOVASCULAR:  Regular rate and rhythm.  No murmurs, rubs or gallops.  CHEST:  Clear to auscultation bilaterally.  ABDOMEN:  Positive bowel sounds, soft, nontender and nondistended.  EXTREMITIES:  There is a cast over the right arm from a previous  fracture.  There is pain on palpation of the right hip.  All extremities  are neurovascularly intact.  No clubbing, cyanosis, or edema.  Dorsalis  pedis pulses are 2+ bilaterally.    DIAGNOSTICS:  1. EKG shows normal sinus rhythm.  No acute ST/T wave changes.  No      active ischemia.  2. Chest x-ray:  No acute cardiopulmonary disease.  3. CT scan:  Right femoral neck fracture.  All these studies were      reviewed by myself.   LABORATORY DATA:  CBC within normal limits.  CMP is notable for normal  renal function.  Urinalysis shows too numerous to count white blood  cells and positive nitrites.   ASSESSMENT/PLAN:  [name] is a 50 year old white female with a past  medical history significant for cerebrovascular accident, bipolar  disorder and alcohol abuse, who presents to the emergency department  with a right hip fracture.   1. Right hip fracture.  At this time, the patient appears stable from      a cardiovascular standpoint.  I do not feel that there is any      further cardiovascular workup that is indicated prior to surgery.      Her EKG is stable with no active ischemia.  I feel that she is low      risk for this surgery.  DVT prophylaxis would be benefitted in this      patient and I will defer this to the primary team depending on      their surgical management.   1. History of heavy alcohol abuse.  Would recommend placing the      patient on the CIWA protocol.  We will use CIWA protocol with      Librium because she is allergic to Ativan.   1. Urinary tract infection.  We will check an urine and culture, and      start the patient on ciprofloxacin 500 mg b.i.d.   1. [facility] Consult Team to follow.   Thank you for allowing [place] to participate in this patient's care.  Please  contact us if we can be of any further assistance.      Therisa Doyne, MD  Electronically Signed     SJT/MEDQ  D:  04/24/2008  T:  04/25/2008  Job:  [id number]

## 2010-11-08 NOTE — Assessment & Plan Note (Signed)
OFFICE VISIT   [name], [name]  DOB:  [birth year]                                       11/16/2009  CHART#:[id number]   The patient was referred back to our office today for arterial  evaluation because of nonhealing ulceration on the anterior aspect of  the right second toe.  She states it started in September 2010 after an  insect bite.  She is convinced that this is improving and is about one-  third the size it was previously.  She is using local wound care at the  present time.   She was previously seen by me in our office in October 2090 and had  normal arterial brachial indices at that time and palpable pulses in  both feet but had no nonhealing ulcers at that time.   CHRONIC MEDICAL PROBLEMS:  1. Left brain stroke with right hemiparesis.  2. Negative for coronary artery disease, diabetes, myocardial      infarction, hyperlipidemia or COPD.   PREVIOUS SURGERY:  Includes drainage of left leg hematoma, appendectomy  and left hip replacement.   FAMILY HISTORY:  Negative for coronary artery disease, diabetes and  stroke.   SOCIAL HISTORY:  Continues to smoke 1 pack of cigarettes per day.   REVIEW OF SYSTEMS:  Negative for chest pain, dyspnea on exertion, PND,  orthopnea, asthma, wheezing, chronic bronchitis.  Continues to have  total paralysis of her right leg.  No new neurologic symptoms.   PHYSICAL EXAMINATION:  Blood pressure 104/70, heart rate 73, temperature  97.7.  General:  She is a chronically ill-appearing patient in a  wheelchair with the right hemiparesis;  alert, oriented x3.  HEENT:  Exam is normal.  EOMs intact. Neck:  Supple, 3+ carotid pulses.  No  bruits.  Chest:  Clear to auscultation.  No wheezing.  Cardiovascular:  Regular rhythm,  no murmurs.  Lower extremities:  Exam reveals 3+  femoral and popliteal, 2+ dorsalis pedis pulses bilaterally.  Right  lower extremity is severely paretic from previous stroke.  She has a  linear  ulceration over the proximal phalanx of the right second toe  which is clean and noninfected, measuring about 6 mm in length.  No  other ulcers noted.  No pressure sores noted.   Today I ordered lower extremity arterial Dopplers which I have reviewed  and interpreted.  She has biphasic flow in dorsalis pedis arteries of  both feet with normal waveforms.  ABIs are 1.9.   This patient does not need further vascular evaluation.  Local wound  care would be appropriate and hopefully this will heal with time.  The  worse case scenario would be a right second toe amputation if she  develops osteomyelitis, which is not necessary at this time.     [name] [name], M.D.  Electronically Signed   JDL/MEDQ  D:  11/16/2009  T:  11/17/2009  Job:  [id number]   cc:   Donnel Saxon

## 2010-11-08 NOTE — H&P (Signed)
NAME:  [name] [name], [name] [name]                ACCOUNT NO.:  [id number]   MEDICAL RECORD NO.:  [id number]          PATIENT TYPE:  INP   LOCATION:  5154                         FACILITY:  [facility]   PHYSICIAN:  Gabrielle Dare. Janee Morn, M.D.DATE OF BIRTH:  July 20, 1960   DATE OF ADMISSION:  03/20/2008  DATE OF DISCHARGE:                              HISTORY & PHYSICAL   CHIEF COMPLAINT:  Altered mental status and forehead laceration.   HISTORY OF PRESENT ILLNESS:  The patient is a 50 year old white female  who was found down at the nursing home where she lives with multiple  wine bottles around her.  She had a forehead laceration noted.  She was  brought for evaluation of altered mental status.  She was noted to have  systolic blood pressure in the 70s.  We were asked to evaluate her from  a trauma standpoint.  There has not been any evidence of significant  blood loss at the scene.   PAST MEDICAL HISTORY:  Alcohol abuse, anxiety, depression, and bipolar  disorder.   PAST SURGICAL HISTORY:  Unknown.   SOCIAL HISTORY:  She smokes and drinks alcohol.  She is unemployed and  lives at rest home.   ALLERGIES:  ATIVAN.   MEDICATIONS:  1. Aspirin 81 mg daily.   1. Colchicine 0.6 mg b.i.d.  2. Cyclobenzaprine 10 mg nightly.  3. Motrin 400 mg t.i.d.  4. Percocet p.r.n.  5. Calcium supplementation.  6. Paroxetine 20 mg daily.   1. Seroquel 50 mg nightly.  2. Cymbalta 20 mg daily.   REVIEW OF SYSTEMS:  Unable to be obtained due to mental status.   PHYSICAL EXAMINATION:  VITAL SIGNS:  Pulse 84, respiratory rate 17,  blood pressure 94/62.  HEENT:  linear 2 cm laceration with a mild irregularity in the left  forehead.  Eyes, pupils are equal, round, and  reactive. There was no  hemotympanum.  Oral mucosa is moist.  She is edentulous.  Face is  symmetric.  NECK:  Cervical spine had no significant tenderness and no masses were  flet.  LUNGS:  Clear to auscultation.  No wheezes heard.  Breath sounds are  clear and equal with no dullness to percussion.  CARDIOVASCULAR:  Heart regular.  No murmurs, rubs, or gallops.  Distaql  pulses 1+ without edema  EXTREMITIES:  Distal pulses are 2+.  No peripheral edema.  ABDOMEN:  Soft and nontender.  No organomegaly.  Bowel sounds are  hypoactive.  Pelvic is stable anteriorly.  MUSCULOSKELETAL:  bruises of various ages to bilateral knees, ankles.  No deformity or significant muscle tenderness.  NEUROLOGIC:GCS 13: V3, V4, and V6.   LABORATORY STUDIES:  Sodium 139, potassium 3.7, chloride 50.5, BUN 7,  creatinine 0.68, hemoglobin 13.6.  Alcohol level 244.  CT scan of the  head negative.  She does have evidence of an old left CVA.  CT scan of  the cervical spine negative.   IMPRESSION:  A 50 year old white female status post fall with head  laceration and alcohol intoxication.  Plan is to admit her and place her  on alcohol withdrawal  protocol.      Gabrielle Dare Janee Morn, M.D.  Electronically Signed     BET/MEDQ  D:  03/20/2008  T:  03/21/2008  Job:  [id number]

## 2010-11-08 NOTE — Discharge Summary (Signed)
NAME:  [name] [name], [name] [name]                ACCOUNT NO.:  [id number]   MEDICAL RECORD NO.:  [id number]          PATIENT TYPE:  IPS   LOCATION:  4009                         FACILITY:  [facility]   PHYSICIAN:  Ranelle Oyster, M.D.DATE OF BIRTH:  December 20, 1960   DATE OF ADMISSION:  04/29/2008  DATE OF DISCHARGE:  05/18/2008                               DISCHARGE SUMMARY   DISCHARGE DIAGNOSES:  1. Right femoral neck fracture, status post hip hemiarthroplasty,      April 25, 2008.  2. History of left hip fracture, June 2008.  3. History of cerebrovascular accident with residual right-sided      weakness, January 2009.  4. Bipolar disorder.  5. Hypertension.  6. Gout.  7. History of alcohol use.  8. Right wrist fracture.   This is a 50 year old white female with history of bipolar disorder;  cerebrovascular accident; mild residual right-sided weakness, January  2009; and left hip fracture, June 2008.  The patient was admitted on  April 24, 2008, with unwitnessed fall and negative loss of  consciousness sustaining a right femoral neck fracture.  Underwent right  hip hemiarthroplasty on April 25, 2008, per Dr. Madelon Lips.  Weightbearing as tolerated with posterior hip precautions.  Subcutaneous  Lovenox for deep vein thrombosis prophylaxis with venous Doppler studies  on April 26, 2008, negative for deep vein thrombosis.  Short-arm cast  to right upper extremity for history of wrist fracture.  X-rays  unavailable, these were done at Dr. Eilleen Kempf office.  She was admitted  for comprehensive rehab program.   PAST MEDICAL HISTORY:  1. Bipolar disorder.  2. Cerebrovascular accident, January 2009, with right-sided weakness.      She received Inpatient Rehab Services, July 24, 2007, to      August 13, 2007.  3. History of gout.  4. History of left hip fracture in June 2008.  5. Noted history of tobacco and alcohol use.   ALLERGIES:  1. ATIVAN.  2. PAXIL.   SOCIAL HISTORY:  She is a  resident of [facility], one level home, no steps to entry.   FUNCTIONAL HISTORY:  Prior to admission was bed and wheelchair.   FUNCTIONAL STATUS UPON ADMISSION TO REHAB SERVICES:  Total assist for  mobility.   MEDICATIONS PRIOR TO ADMISSION:  1. Aspirin 81 mg daily.  2. Multivitamin daily.  3. Vitamin D daily.  4. Os-Cal 500 mg three times daily.  5. Motrin 400 mg three times daily.  6. Colchicine 0.6 mg twice daily.  7. Nifedipine 10 mg three times daily.  8. Cymbalta 60 mg daily.  9. Zanaflex as needed.  10.Xanax 0.5 mg every 4 hours as needed.   PHYSICAL EXAMINATION:  VITAL SIGNS:  Blood pressure 102/67, pulse 100,  temperature 97.3, and respirations 18.  GENERAL:  This was an alert female appeared older than stated age,  oriented x3.  Deep tendon reflexes are 2+, right hip dressing with  Mepilex dressing, right upper extremity with short-arm cast.  She did  have a right foot drop.   REHABILITATION HOSPITAL COURSE:  The patient was  admitted to Inpatient  Rehab Services with therapies initiated on a 3-hour daily basis  consisting of physical therapy, occupational therapy, and 24-hour  rehabilitation nursing.  The following issues were addressed during the  patient's rehabilitation stay.  Pertaining to Mr. Halberg right femoral  neck fracture, she had undergone hip hemiarthroplasty per Dr. Madelon Lips on  April 25, 2008.  She was weightbearing as tolerated with posterior hip  precautions.  She had been fitted with an abduction brace per advanced  prosthetics.  She had been wearing a PRAFO with kick stand for right  lower extremity that we used while in bed prior to abduction brace being  fitted.  Pain management ongoing with the use of scheduled OxyContin 10  mg every 12 hours, Zanaflex 2 mg every 8 hours, and oxycodone immediate  release for breakthrough pain.  She remained on subcutaneous Lovenox for  deep vein thrombosis prophylaxis throughout her  rehab course.  She had  been on aspirin prior to admission for history of cerebrovascular  accident.  This was resumed at time of discharge.  Noted history of  bipolar disorder.  She remained on Cymbalta.  She did have a documented  history of gout, this was without flare-up during her rehab course.  She  remained on colchicine.  Blood pressures well-controlled with Procardia  10 mg three times daily.  This had been held as of May 11, 2008,  due to some mild orthostatic changes and blood pressures remained well-  controlled.   Overall for functional mobility.  She was moderate assist for transfers,  max assist to stand, minimum-to-moderate assist to toilet transfers,  simple set up for grooming and eating, moderate assist bathing, and  minimal assist to upper body dressing.  She was continent of bowel and  bladder with routine toileting provided by rehab nursing.  She did have  a short arm cast to the right upper extremity for suspect wrist  fracture, which her sister-in-law had stated with findings while at Dr.  Eilleen Kempf office, these x-rays were not made available.  Dr. Eilleen Kempf  office was to be contacted prior to the patient's departure from the  hospital for question of changing casts versus discontinuation of casts.  She was weightbearing as tolerated with neurovascular sensation intact.  The patient received weekly collaborative interdisciplinary team  conferences to discuss estimated length of stays, any barriers to  discharge.  She was quite limited with her mobility.  It was felt  skilled nursing facility would be needed with bed becoming available on  May 18, 2008, and discharge taking place at that time.   LATEST LABORATORIES:  As of November 2009, showed hemoglobin 11.1,  hematocrit 34.2, platelet 386,000, and WBC of 6.4.  Sodium 137,  potassium 4.3, BUN 7, and creatinine 0.6.   DISCHARGE MEDICATIONS:  At time of dictation included;  1. Aspirin 81 mg p.o.  daily.  2. Colchicine 0.6 mg p.o. b.i.d.  3. Cymbalta 60 mg p.o. daily.  4. OxyContin sustained release 10 mg every 12 hours x2 weeks and stop.  5. Vitamin D 1000 units p.o. daily.  6. Os-Cal 500 mg one tablet p.o. b.i.d.  7. Zanaflex 2 mg p.o. every 8 hours.  8. Oxycodone immediate release 5 mg one to two tablets every 4 hours      as needed for pain, dispense with 90 tablets.  9. Xanax 0.5 mg three times daily as needed.   DIET:  Regular.   SPECIAL INSTRUCTIONS:  Weightbearing as tolerated with  posterior hip  precautions.  Abduction bar per advanced prosthetics when in bed.   FOLLOWUP:  She should follow up Dr. Madelon Lips, Orthopedic Services as  needed for medical management.  Dr. Dorothe Pea, was her medical management  doctor.      Mariam Dollar, P.A.      Ranelle Oyster, M.D.  Electronically Signed    DA/MEDQ  D:  05/15/2008  T:  05/16/2008  Job:  [id number]   cc:   Ranelle Oyster, M.D.  Dyke Brackett, M.D.  Jethro Bastos, M.D.

## 2010-11-08 NOTE — Discharge Summary (Signed)
NAME:  [name] [name], [name] [name]                ACCOUNT NO.:  [id number]   MEDICAL RECORD NO.:  [id number]          PATIENT TYPE:  INP   LOCATION:  5154                         FACILITY:  [facility]   PHYSICIAN:  Gabrielle Dare. Janee Morn, M.D.DATE OF BIRTH:  14-Dec-1960   DATE OF ADMISSION:  03/20/2008  DATE OF DISCHARGE:  03/21/2008                               DISCHARGE SUMMARY   DISCHARGE DIAGNOSES:  1. Fall.  2. Acute alcohol intoxication.  3. Chronic alcohol abuse.  4. Forehead laceration.   CONSULTANTS:  None.   PROCEDURES:  None.   HISTORY OF PRESENT ILLNESS:  This is a 50 year old white female who was  found down at the rest home with multiple wine bottles around her.  There was a forehead laceration and she was brought to the emergency  department as a nontrauma code.  While in the department the patient's  blood pressure dipped down in the 70s and trauma was asked to evaluate.  About the time we arrived her pressure had returned to normal.  Her  forehead laceration was closed with Dermabond and she was admitted for  observation and sobriety.   HOSPITAL COURSE:  The patient did well overnight in the hospital.  The  next day she was able to tolerate a regular diet.  Did not have any  symptomatology from a possible concussion.  Her laceration was healing  well without any signs of dehiscence, erythema or discharge and she was  able to be discharged back to her nursing facility in good condition.   DISCHARGE MEDICATIONS:  The patient will resume taking the medications  she was on at the nursing facility.  These include:  1. Aspirin 81 mg daily.  2. Seragen one daily.  3. Colchicine 0.6 mg b.i.d.  4. Cyclobenzaprine 10 mg q.h.s.  5. Motrin 400 mg t.i.d.  6. Percocet as needed.  7. Os-Cal 500 three q.i.d.  8. Paroxetine 20 mg q.h.s.  9. Prilosec 20 mg daily.  10.Seroquel 50 mg q.h.s.  11.Cymbalta 20 mg daily.   These should all be available to her at home and she will be able to get  refills from whoever prescribes those for her primarily.  There will be  no new medicines prescribed for her for discharge from the hospital.   FOLLOWUP:  The patient does not need followup unless there is a problem.  Followup will be on an as-needed basis.      Earney Hamburg, P.A.      Gabrielle Dare Janee Morn, M.D.  Electronically Signed    MJ/MEDQ  D:  03/21/2008  T:  03/21/2008  Job:  [id number]

## 2010-11-08 NOTE — H&P (Signed)
NAME:  [name], [name]                ACCOUNT NO.:  [id number]   MEDICAL RECORD NO.:  [id number]          PATIENT TYPE:  INP   LOCATION:  3002                         FACILITY:  [facility]   PHYSICIAN:  Ramiro Harvest, MD    DATE OF BIRTH:  01-08-1961   DATE OF ADMISSION:  07/22/2007  DATE OF DISCHARGE:                              HISTORY & PHYSICAL   HISTORY OF PRESENT ILLNESS:  [name] is a 50 year old white  female with history of alcohol abuse, bipolar disorder, tobacco abuse,  who presented to the ED with a right-sided weakness and right-sided  numbness and right facial numbness on awakening.  The patient stated  that she went to bed at 2:30 a.m. on the day of admission in a normal  state and woke up at 9:30 a.m. on the day of admission with symptoms of  right-sided weakness and numbness and right facial numbness.  The  patient stated that she did not have any facial asymmetry, no slurred  speech, no visual changes, no aphasia, no dysarthria, no other  associated symptoms, no visual changes either.  The patient did state  that the right-sided weakness left her unable to walk without assistance  and had to have her mother help her to the bathroom and had to crawl on  her way back.  The patient asked that the mother call the primary care  physician's office and was told to come to the emergency room.  In the  ED the patient had a head CT which was negative for any acute bleed.  However, she did have some remote lacunar infarcts.  We were called to  admit the patient for further evaluation and management.   ALLERGIES:  The patient stated that she is allergic to ATIVAN.  She  states that it gives her hallucinations and makes her off balance as  well.   PAST MEDICAL HISTORY:  1. Alcohol abuse.  2. Anxiety.  3. Alcohol withdrawal.  4. Status post left hip replacement in June 2008 at [facility].  5. Bipolar disorder.  The patient is scheduled to be seen at Mental      Health in the first week of February 2009 for further evaluation.Marland Kitchen   HOME MEDICATIONS:  None.   SOCIAL HISTORY:  The patient lives in [place] with her mother and  sister.  She has a 10 pack-year tobacco history.  She also has a  positive history of alcohol abuse.  The patient does state that she quit  in October of 2008.  The patient also stated that she used to use  illicit drugs such as cocaine and Ecstasy but quit using those in 2002  and intermittently or occasionally uses some marijuana.   FAMILY HISTORY:  Mother alive, age 37, and healthy.  She has had a  history of TB when she was much younger.  Father deceased, age 57, from  metastatic cancer.  The patient has two brothers and two sisters, all of  whom are healthy.  No history of CVA in the family.   REVIEW  OF SYSTEMS:  As per HPI, otherwise negative.   PHYSICAL EXAMINATION:  Temperature 97, blood pressure 100/60, pulse 59,  respiratory rate 19, saturating 100% on 2 L nasal cannula.  GENERAL:  The patient is in bed in no apparent distress.  HEENT:  Normocephalic, atraumatic.  Pupils equal, round, and reactive to  light.  Extraocular movements intact.  Oropharynx is clear.  No lesions,  no exudates.  NECK:  Supple, no lymphadenopathy.\  RESPIRATORY:  Lungs are clear to auscultation bilaterally.  No wheezes,  no rhonchi.  CARDIOVASCULAR:  Regular rate and rhythm.  No murmurs, rubs, or gallops.  ABDOMEN:  Soft, nontender, nondistended.  Positive bowel sounds.  EXTREMITIES:  No clubbing, cyanosis, or edema.  NEUROLOGIC:  The patient is alert and oriented x3.  Cranial nerves II-  XII grossly intact.  Visual fields are intact.  Patient with some right-  sided dysmetria, decreased sensation in the right upper and right lower  extremity as well as decreased sensation on the right face, 2+ bilateral  upper extremity reflexes.  Unable to obtain reflexes in the Achilles  tendon.  The patient does have ?upgoing toes on Babinski  bilaterally.  Right pronator drift, 2-3/5 right upper extremity and right lower  extremity strength.  Left upper and left lower extremities are 5/5.  Gait not tested secondary to safety issues.   LABORATORY DATA:  CBC:  White count 7.1, hemoglobin 15.2, platelets 215,  hematocrit 45.6, ANC of 4.5.  Amylase 115, lipase 29.  Sodium 138,  potassium 4.4, chloride 105, bicarb 25, BUN 9, creatinine 0.66, glucose  80, calcium 9.1.  Bilirubin 0.9, alkaline phosphatase 83, AST 26, ALT  21, protein 6.8, albumin 3.8.   CT of the head:  Preliminary shows no acute intracranial findings.  Remote bilateral lacunar infarcts.   ASSESSMENT/PLAN:  [name] is a 50 year old female, history of  excessive tobacco abuse, alcohol abuse in the past, bipolar disorder,  anxiety, who presented to the ED with right-sided weakness and numbness.  1. Probable left cerebrovascular accident.  Will admit the patient to      telemetry unit.  Will get a swallow evaluation.  Will do a stroke      workup.  Will check coags, hemoglobin A1c, a urinalysis.  Will      cycle cardiac enzymes q.8 h. x3.  Check an EKG.  Check a fasting      lipid profile.  Check a fasting homocystine.  Check a urine drug      screen.  Check an alcohol level.  Check a pregnancy test.  Get 2-D      echocardiogram.  Check carotid Dopplers.  Check an MRI/MRA of the      brain with and without contrast.  Check a chest x-ray.  Get a PT/OT      consult.  Place patient on aspirin 325 mg daily.  Start normal      saline with 75 ml per hour.  Risk factor modification.  Tobacco      cessation.  Will consult with neurology for further evaluation and      recommendations.  2. Bipolar disorder, stable.  The patient is scheduled for outpatient      appointment with mental health in the first week of February.  Will      defer treatment of this as an outpatient.  3. Anxiety, stable.  4. Tobacco abuse.  Will get tobacco cessation.  5. Alcohol abuse.  The  patient has stated  that she quit drinking in      October 2008.  Will monitor for withdrawal symptoms.  If the      patient starts to experience withdrawal symptoms, will start the      Librium protocol.  Will start the patient on thiamine and folate      daily during the hospitalization.  Will monitor.  Prophylaxis:      Protonix for GI prophylaxis, Lovenox for DVT prophylaxis.  The      patient's primary care physician is Dr. Dorothe Pea of [facility].      Ramiro Harvest, MD  Electronically Signed     DT/MEDQ  D:  07/22/2007  T:  07/23/2007  Job:  [id number]   cc:   Jethro Bastos, M.D.

## 2010-11-08 NOTE — H&P (Signed)
NAME:  [name] [name], [name] [name]                ACCOUNT NO.:  [id number]   MEDICAL RECORD NO.:  [id number]          PATIENT TYPE:  IPS   LOCATION:  4033                         FACILITY:  [facility]   PHYSICIAN:  Ranelle Oyster, M.D.DATE OF BIRTH:  [birth year]   DATE OF ADMISSION:  07/24/2007  DATE OF DISCHARGE:                              HISTORY & PHYSICAL   CHIEF COMPLAINT:  Right-sided weakness.   HISTORY OF PRESENT ILLNESS:  This is a 50 year old white female with a  history by bipolar disorder and polysubstance abuse admitted who was  admitted on January 26 with right-sided weakness and numbness.  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  She had nonspecific white  matter disease.  No intracranial or internal carotid stenosis was noted.  Echocardiogram essentially was unremarkable.  Carotid Dopplers were  normal.  The patient has had ongoing right hemiparesis and sensation  loss on the right.  She was placed on an aspirin for stroke prophylaxis.  The patient has been working with therapy and continues to struggle with  mobility and self-care, requiring min to total assistance with these  activities.   REVIEW OF SYSTEMS:  Notable for anxiety, numbness, weakness, rash.  She  has had some knee pain bilaterally as well.  A full review is in the  written history and physical.   PAST MEDICAL HISTORY:  1. Bipolar disorder.  2. Polysubstance abuse.  3. Left total hip replacement.  4. Anxiety disorder.  5. Alcohol use abuse with withdrawal seizures.  6. Appendectomy.  7. History of abnormal LFTs.   FAMILY HISTORY:  Positive for TB and cancer.   SOCIAL HISTORY:  The patient lived in [place] and recently moved to  [place] this month.  She had been incarcerated recently for a DUI  offense.  She quit alcohol in October and has a history of  cocaine and  ecstasy use, last used in 2002.  She occasionally uses marijuana still.   FUNCTIONAL HISTORY:  The patient was independent prior to arrival and  currently she is requiring min to max assist.   ALLERGIES:  ATIVAN, which causes hallucination and nausea.   HOME MEDICATIONS:  None.  The patient had not been on medications for  mood control.   LABS:  Hemoglobin 15.2, white count 7.1, platelets are 215,000.  Sodium  138, potassium 4.4, BUN 9, creatinine 0.66.   PHYSICAL EXAM:  Blood pressure is 106/64, pulse is 62, temperature 98.4,  respiratory rate 18.  The patient is pleasant, alert and oriented x3.  Pupils equally round and reactive to light.  Nose and throat exam is  unremarkable with fair dentition and pink oral mucosa.  NECK:  Supple without JVD or lymphadenopathy.  CHEST:  Clear to auscultation bilaterally without wheezes, rales or  rhonchi.  HEART:  Regular rate and rhythm without murmur, rubs or gallops.  She  has no clubbing or cyanosis but 1+ edema of both legs.  She has some  bruises and abrasions on the legs.  ABDOMEN:  Soft, nontender.  NEUROLOGIC:  Cranial nerves revealed a right central VII and right  facial weakness.  Reflexes are increased on the right.  Sensation is 1/2  on the right arm and leg as well.  Judgment and orientation are good.  Memory is good.  Mood is appropriate.  Strength is 2+-3/5 right upper  extremity, 2+/5 proximal lower extremity, and 0 to trace distally.  Left-  sided strength is 4-5/5.   ASSESSMENT:  1. 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  Begin comprehensive inpatient rehab with PT      to assess and treat for range of motion, gait training and      equipment.  OT will assess and treat for range of motion,      strengthening, safety and equipment.  Rehab nurse will follow on 24-      hour basis for  bowel, bladder, skin, medication and safety issues.      Speech/language pathology will assess and treat for speech issues.      Rehab case manager/social worker will assess for psychosocial needs      and discharge planning needs.  Estimated length of stay is 2-3      weeks.  Prognosis is fair to good.  2. Tobacco use.  Follow with nicotine patch for now.  3. Bipolar disorder.  The patient was placed on Paxil today.  Observe      mood.  May need to adjust and add medications as needed for mood      needs.  Consider psychiatry consult.  4. Rash:  Hypoallergenic sheets were ordered.  Apply steroid cream      also as needed for rash.  5. History of abnormal liver function tests:  Check liver function      tests in the morning.  6. DVT prophylaxis with subcutaneous Lovenox.  7. Stroke prophylaxis with aspirin 325 mg p.o. daily.      Ranelle Oyster, M.D.  Electronically Signed     ZTS/MEDQ  D:  07/24/2007  T:  07/25/2007  Job:  [id number]

## 2010-11-11 NOTE — Discharge Summary (Signed)
NAME:  [name] [name], [name] [name]                ACCOUNT NO.:  [id number]   MEDICAL RECORD NO.:  [id number]          PATIENT TYPE:  INP   LOCATION:  5019                         FACILITY:  [facility]   PHYSICIAN:  Dyke Brackett, M.D.    DATE OF BIRTH:  [birth year]   DATE OF ADMISSION:  04/24/2008  DATE OF DISCHARGE:  04/29/2008                               DISCHARGE SUMMARY   The patient was admitted to the hospital on April 24, 2008, with right  hip injury that occurred 2 days ago.  She was admitted directly to Dr.  Madelon Lips where she was seen outpatient by, I believe, [facility] and diagnosed with right hip femoral neck fracture,  therefore, she was admitted to the hospital directly under Dr. Madelon Lips.  Internal Medicine was consulted at [facility], Dr. Mayford Knife.  We will plan for  right hip hemiarthroplasty following the clearance from Internal  Medicine.  She had a history of previous stroke involving the same side  as her right hip femoral neck fracture.  This was a nonwitnessed fall,  getting into a wheelchair 2 days earlier when she injured her right hip  and it was noticed at [facility] that she had a  femoral neck fracture and then we were contacted in Dr. Candise Bowens office  as we were on call for Orthopedics for group call.   PHYSICAL EXAMINATION:  HEART:  Regular rate and rhythm.  LUNGS:  Clear to auscultation.  ABDOMEN:  Soft, nontender, and nondistended.   ALLERGIES:  ATIVAN and PAXIL.   CT scan showed right hip femoral neck fracture.   On April 25, 2008, he had clearance from Internal Medicine, therefore  we proceeded for right hip femoral neck fracture or right hip  hemiarthroplasty under Dr. Madelon Lips, physician assistant was Kateri Plummer, PA.  So, procedure was right hip hemiarthroplasty.   The patient tolerated the procedure well, was transferred to PACU in  stable condition and then up to 5000 floor.   HOSPITAL COURSE:  April 26, 2008, was  postop day #1, status post right  hip hemiarthroplasty.  Hemoglobin was 11.1 and hematocrit 33.2.  The  patient complained of rash on her chest, we suspected with  Librium__________ Percocet and Morphine before an Internal Medicine was  helping adjust or stop Librium that she was taking.  Vital signs were  stable and afebrile.  The patient was given some Benadryl and helped  with the rash and antihistamine to stop the histamine reaction.  She was  also having some mild pain in her calf, thigh was tender, therefore was  proceeded forward to do Doppler ultrasound of the right lower extremity,  which did up as negative for any DVT.  Postop day #2, was on April 27, 2008, vital signs were stable.  She was afebrile.  Labs were within  functional limit.  Right lower extremity neurovascularly intact.  The  patient was doing well with no complaints.  The right lower extremity  Doppler ultrasound was negative for any DVT as mentioned earlier.  We  notified the [facility]  and they could give [place] __________ when she  was transferred to skilled nursing facility.  She would need that for  further physical therapy.  Postop day #3, was on April 28, 2008,  hemoglobin was at 10.1.  Vital signs were stable.  She was afebrile.  Right lower extremity neurovascularly intact.  Both sensory and motor  grossly neurovascularly intact.  Continue on physical therapy.  Weightbearing as tolerated, right lower extremity __________the patient  was suggested to potentially go a rehab facility, so we got a rehab and  social work consult.  April 29, 2008, was postop day #4, vitals signs  were stable.  Hemoglobin was at 10.3.  She was afebrile.  Minimal  drainage from her hip wound incision.  Right lower extremity  neurovascularly intact.  Minimal calf pain at this point and as  mentioned negative Doppler ultrasound.  Plan at that point was to  transfer to rehab unit and followup outpatient, Dr. Madelon Lips in 2  weeks.   ASSESSMENT AND PLAN:  Status post right hip hemiarthroplasty.  Postoperative day #4, was April 29, 2008, transferred to the rehab  unit.  Instructed staples would need to be removed 10-14 days  postoperative from that right hip, should stay on Lovenox for 14-15 days  postoperatively to help prevent DVT, PE.  Plan for followup outpatient  basis with Dr. Madelon Lips in 2-3 weeks for recheck.  The patient was in  stable condition when transferred to the rehab unit and was approved by  Internal Medicine and was consulted __________ patient, Dr. Mayford Knife with  [facility].   FINAL DIAGNOSIS:  Status post right hip femoral neck fracture, right hip  hemiarthroplasty.   DISCHARGE MEDICATIONS:  Per Orthopedics was:  1. Percocet 5/325 one-two tablets p.o. q.4-6 h. p.r.n. pain.  2. Lovenox 40 mg subcu per day at 8 a.m. for a total of 15 days      postoperatively.  3. Robaxin 500 mg 1 tablet p.o. q.6-8 h. p.r.n. pain.   The rest of her medicines that were controlled by Internal Medicine  consulters.      Sharol Given, PA      Dyke Brackett, M.D.  Electronically Signed    JBS/MEDQ  D:  06/10/2008  T:  06/11/2008  Job:  [id number]

## 2011-03-16 LAB — COMPREHENSIVE METABOLIC PANEL
ALT: 21
ALT: 21
AST: 26
Albumin: 3.8
Alkaline Phosphatase: 78
Alkaline Phosphatase: 83
BUN: 13
BUN: 9
CO2: 25
CO2: 25
Calcium: 9.1
Chloride: 105
Creatinine, Ser: 0.66
GFR calc Af Amer: >60
GFR calc non Af Amer: >60
GFR calc non Af Amer: >60
Glucose, Bld: 80
Glucose, Bld: 91
Potassium: 3.5
Potassium: 4.4
Sodium: 138
Sodium: 138
Total Bilirubin: 0.5
Total Bilirubin: 0.9
Total Protein: 6.2
Total Protein: 6.8

## 2011-03-16 LAB — DIFFERENTIAL
Basophils Absolute: 0
Basophils Absolute: 0
Basophils Relative: 0
Basophils Relative: 0
Eosinophils Absolute: 0.1
Eosinophils Absolute: 0.2
Eosinophils Relative: 1
Lymphocytes Relative: 26
Lymphs Abs: 1.9
Monocytes Absolute: 0.6
Monocytes Relative: 9
Monocytes Relative: 9
Neutro Abs: 3.3
Neutro Abs: 4.5
Neutrophils Relative %: 59
Neutrophils Relative %: 64

## 2011-03-16 LAB — CBC
HCT: 39.4
HCT: 45.6
Hemoglobin: 13.3
Hemoglobin: 15.2 — ABNORMAL HIGH
MCHC: 33.3
MCHC: 33.7
MCV: 94.5
Platelets: 215
RBC: 4.19
RBC: 4.82
RDW: 13.1
RDW: 13.2
WBC: 7.1

## 2011-03-16 LAB — CARDIAC PANEL(CRET KIN+CKTOT+MB+TROPI)
CK, MB: 0.9
Relative Index: INVALID
Relative Index: INVALID
Total CK: 36
Total CK: 40

## 2011-03-16 LAB — LIPASE, BLOOD: Lipase: 29

## 2011-03-16 LAB — PROTIME-INR
INR: 0.9
Prothrombin Time: 12.8

## 2011-03-16 LAB — CK TOTAL AND CKMB (NOT AT ARMC)
CK, MB: 1.2
Relative Index: INVALID
Total CK: 48

## 2011-03-16 LAB — RAPID URINE DRUG SCREEN, HOSP PERFORMED
Amphetamines: NOT DETECTED
Benzodiazepines: NOT DETECTED
Tetrahydrocannabinol: NOT DETECTED

## 2011-03-16 LAB — AMYLASE: Amylase: 115

## 2011-03-16 LAB — LIPID PANEL
LDL Cholesterol: 96
Triglycerides: 51

## 2011-03-16 LAB — GC/CHLAMYDIA PROBE AMP, GENITAL: Chlamydia, DNA Probe: NEGATIVE

## 2011-03-17 LAB — SYNOVIAL CELL COUNT + DIFF, W/ CRYSTALS
Monocyte-Macrophage-Synovial Fluid: 27 — ABNORMAL LOW
WBC, Synovial: 6915 — ABNORMAL HIGH

## 2011-03-17 LAB — BODY FLUID CULTURE: Culture: NO GROWTH

## 2011-03-23 LAB — POCT I-STAT, CHEM 8
Calcium, Ion: 1.1 — ABNORMAL LOW
Chloride: 104
Creatinine, Ser: 0.7
Glucose, Bld: 93
Hemoglobin: 17.7 — ABNORMAL HIGH
Potassium: 4.3

## 2011-03-27 LAB — BENZODIAZEPINE, QUANTITATIVE, URINE
Alprazolam (GC/LC/MS), ur confirm: 440 ng/mL
Flurazepam GC/MS Conf: NEGATIVE

## 2011-03-27 LAB — BASIC METABOLIC PANEL
CO2: 26
Calcium: 7.3 — ABNORMAL LOW
GFR calc Af Amer: >60
Sodium: 140

## 2011-03-27 LAB — DIFFERENTIAL
Eosinophils Absolute: 0.1
Lymphocytes Relative: 21
Lymphocytes Relative: 22
Lymphs Abs: 1.8
Lymphs Abs: 1.8
Neutro Abs: 5.9
Neutrophils Relative %: 70
Neutrophils Relative %: 65

## 2011-03-27 LAB — COMPREHENSIVE METABOLIC PANEL
ALT: 33
BUN: 7
BUN: 15
CO2: 25
CO2: 29
Calcium: 8.2 — ABNORMAL LOW
Calcium: 8.9
Creatinine, Ser: 0.68
Creatinine, Ser: 0.58
GFR calc non Af Amer: >60
GFR calc non Af Amer: >60
Glucose, Bld: 109 — ABNORMAL HIGH
Glucose, Bld: 95

## 2011-03-27 LAB — URINE DRUGS OF ABUSE SCREEN W ALC, ROUTINE (REF LAB)
Barbiturate Quant, Ur: NEGATIVE
Cocaine Metabolites: NEGATIVE
Creatinine,U: 114
Marijuana Metabolite: NEGATIVE
Methadone: NEGATIVE
Phencyclidine (PCP): NEGATIVE

## 2011-03-27 LAB — URINALYSIS, ROUTINE W REFLEX MICROSCOPIC
Bilirubin Urine: NEGATIVE
Glucose, UA: NEGATIVE
Hgb urine dipstick: NEGATIVE
Ketones, ur: NEGATIVE
Protein, ur: NEGATIVE
Specific Gravity, Urine: 1.007
pH: 6
pH: 6.5

## 2011-03-27 LAB — URINE CULTURE

## 2011-03-27 LAB — CBC
HCT: 40.6
Hemoglobin: 12.4
Hemoglobin: 13.6
Hemoglobin: 14.7
MCHC: 33.4
MCHC: 33.5
MCHC: 33
MCV: 96.3
MCV: 99.7
RBC: 3.83 — ABNORMAL LOW
RBC: 4.22
RBC: 4.46

## 2011-03-27 LAB — RAPID URINE DRUG SCREEN, HOSP PERFORMED
Barbiturates: NOT DETECTED
Cocaine: NOT DETECTED
Opiates: NOT DETECTED

## 2011-03-27 LAB — PROTIME-INR
INR: 0.9
Prothrombin Time: 12.5

## 2011-03-27 LAB — URINE MICROSCOPIC-ADD ON

## 2011-03-28 LAB — COMPREHENSIVE METABOLIC PANEL
ALT: 51 — ABNORMAL HIGH
AST: 31
Albumin: 2.1 — ABNORMAL LOW
Alkaline Phosphatase: 102
BUN: 5 — ABNORMAL LOW
BUN: 7
CO2: 27
Calcium: 7.8 — ABNORMAL LOW
Calcium: 8 — ABNORMAL LOW
Creatinine, Ser: 0.51
GFR calc Af Amer: >60
GFR calc non Af Amer: >60
Potassium: 3.8
Sodium: 131 — ABNORMAL LOW
Total Protein: 4.8 — ABNORMAL LOW

## 2011-03-28 LAB — CBC
HCT: 29.9 — ABNORMAL LOW
HCT: 30.6 — ABNORMAL LOW
HCT: 33.2 — ABNORMAL LOW
HCT: 34.2 — ABNORMAL LOW
Hemoglobin: 10.1 — ABNORMAL LOW
Hemoglobin: 10.3 — ABNORMAL LOW
Hemoglobin: 11.1 — ABNORMAL LOW
Hemoglobin: 11.1 — ABNORMAL LOW
MCHC: 33.5
MCHC: 33.6
MCHC: 34.8
MCV: 98.1
MCV: 98.5
MCV: 98.9
MCV: 99.1
Platelets: 205
Platelets: 236
Platelets: 386
RBC: 3.04 — ABNORMAL LOW
RBC: 3.35 — ABNORMAL LOW
RDW: 13.5
RDW: 13.6
RDW: 13.7
RDW: 13.9
RDW: 14.5
WBC: 6.1

## 2011-03-28 LAB — DIFFERENTIAL
Basophils Relative: 1
Lymphs Abs: 1.2
Monocytes Absolute: 0.5
Monocytes Relative: 10
Neutro Abs: 3.3
Neutrophils Relative %: 61

## 2011-03-28 LAB — BASIC METABOLIC PANEL
BUN: 7
BUN: 7
CO2: 27
CO2: 28
CO2: 32
Calcium: 7.7 — ABNORMAL LOW
Chloride: 100
Chloride: 100
Chloride: 100
GFR calc Af Amer: >60
GFR calc non Af Amer: >60
Glucose, Bld: 108 — ABNORMAL HIGH
Glucose, Bld: 94
Glucose, Bld: 95
Potassium: 3.3 — ABNORMAL LOW
Potassium: 4.3
Potassium: 4.3
Sodium: 132 — ABNORMAL LOW
Sodium: 137

## 2011-03-28 LAB — URINALYSIS, ROUTINE W REFLEX MICROSCOPIC
Bilirubin Urine: NEGATIVE
Glucose, UA: NEGATIVE
Hgb urine dipstick: NEGATIVE
Specific Gravity, Urine: 1.017
Urobilinogen, UA: 1

## 2011-03-28 LAB — URINE MICROSCOPIC-ADD ON

## 2011-03-28 LAB — URINE CULTURE

## 2012-09-11 ENCOUNTER — Ambulatory Visit (INDEPENDENT_AMBULATORY_CARE_PROVIDER_SITE_OTHER): Payer: Medicaid Other | Admitting: Obstetrics

## 2012-09-11 ENCOUNTER — Encounter: Payer: Self-pay | Admitting: Obstetrics

## 2012-09-11 ENCOUNTER — Other Ambulatory Visit: Payer: Self-pay | Admitting: Obstetrics

## 2012-09-11 VITALS — BP 108/72 | HR 110 | Temp 97.7°F | Ht 67.0 in | Wt 108.0 lb

## 2012-09-11 DIAGNOSIS — N390 Urinary tract infection, site not specified: Secondary | ICD-10-CM

## 2012-09-11 DIAGNOSIS — Z0000 Encounter for general adult medical examination without abnormal findings: Secondary | ICD-10-CM

## 2012-09-11 DIAGNOSIS — N951 Menopausal and female climacteric states: Secondary | ICD-10-CM

## 2012-09-11 DIAGNOSIS — Z1231 Encounter for screening mammogram for malignant neoplasm of breast: Secondary | ICD-10-CM

## 2012-09-11 LAB — POCT URINALYSIS DIPSTICK
Blood, UA: 3
Glucose, UA: NEGATIVE
Spec Grav, UA: 1.015
Urobilinogen, UA: NEGATIVE
pH, UA: 6

## 2012-09-11 MED ORDER — CEFUROXIME AXETIL 500 MG PO TABS: 500.0000 mg | ORAL_TABLET | Freq: Two times a day (BID) | ORAL | Status: DC

## 2012-09-11 NOTE — Progress Notes (Signed)
Subjective:     [name] [name] is a 52 y.o. female here for a routine exam.  Current complaints: Lower mid--abdominal pain, burning with urination and difficulty holding urine for past few weeks.  Also c/o hot flashes starting over the past few weeks.  LMP ~ 2 years ago.  Personal health questionnaire reviewed: yes.   Gynecologic History No LMP recorded. Contraception: none.  Not sexually active Last Pap: ~ 5 years ago.Marland Kitchen Results were: normal Last mammogram: None. Results were: None  Obstetric History OB History   Grav Para Term Preterm Abortions TAB SAB Ect Mult Living                   The following portions of the patient's history were reviewed and updated as appropriate: She is allergic to ativan; bactrim; and paxil.   Review of Systems Pertinent items are noted in HPI.    Objective:    BP 108/72  Pulse 110  Temp(Src) 97.7 F (36.5 C) (Oral)  Ht 5\' 7"  (1.702 m)  Wt 108 lb (48.988 kg)  BMI 16.91 kg/m2    PE: Abdomen:  Tender lower mid-abdomen.  + suprapubic tenderness. Vagina clean and mucosa normal. Cervix clean. Pelvic exam:  Uterus NSSC.  Adnexa negative. Extremeties:  Decreased ROM at right hip joint. Assessment:   V70.0   Annual Exam. 627.2   Menopause.  Symptomatic. 599.0   UTI.  Ceftin Rx.  Urine culture sent.   Plan:    Education reviewed: smoking cessation.    F/U in 2 weeks for results of mammogram, etc.

## 2012-09-13 LAB — PAP IG W/ RFLX HPV ASCU

## 2012-09-14 LAB — URINE CULTURE: Colony Count: >=100000

## 2012-09-20 ENCOUNTER — Emergency Department: Payer: Self-pay | Admitting: Emergency Medicine

## 2012-09-26 ENCOUNTER — Ambulatory Visit (INDEPENDENT_AMBULATORY_CARE_PROVIDER_SITE_OTHER): Payer: Medicaid Other | Admitting: Obstetrics

## 2012-09-26 DIAGNOSIS — Z1239 Encounter for other screening for malignant neoplasm of breast: Secondary | ICD-10-CM

## 2012-09-26 DIAGNOSIS — N390 Urinary tract infection, site not specified: Secondary | ICD-10-CM | POA: Insufficient documentation

## 2012-09-26 NOTE — Progress Notes (Signed)
.   Subjective:     [name] [name] is a 52 y.o. female here for her lab results.  Current complaints: patient states that she still has burning during urination with a strong odor.  Personal health questionnaire reviewed: yes.   Gynecologic History No LMP recorded. Contraception: none Last Pap: 2014 .Normal Last mammogram: Never.               Plan:   Patient did not see the doctor.   Macrodantin 100mg  PO BID x 7 days called to [facility] [phone].  Patient advised to take all 7 days.

## 2012-10-10 ENCOUNTER — Ambulatory Visit (INDEPENDENT_AMBULATORY_CARE_PROVIDER_SITE_OTHER): Payer: Medicaid Other | Admitting: Obstetrics

## 2012-10-10 ENCOUNTER — Encounter: Payer: Self-pay | Admitting: Obstetrics

## 2012-10-10 VITALS — BP 100/70 | HR 109 | Temp 98.2°F

## 2012-10-10 DIAGNOSIS — N390 Urinary tract infection, site not specified: Secondary | ICD-10-CM

## 2012-10-10 DIAGNOSIS — R350 Frequency of micturition: Secondary | ICD-10-CM | POA: Insufficient documentation

## 2012-10-10 DIAGNOSIS — N393 Stress incontinence (female) (male): Secondary | ICD-10-CM | POA: Insufficient documentation

## 2012-10-10 LAB — POCT URINALYSIS DIPSTICK
Leukocytes, UA: NEGATIVE
Nitrite, UA: NEGATIVE
pH, UA: 6

## 2012-10-10 NOTE — Progress Notes (Signed)
.   Subjective:     [name] [name] is a 52 y.o. female here for a follow up exam for her recent UTI.  Current complaints - frequent urination.  Now denies any pain or discomfort during urination.  Patient states that she took all of her antibiotcs.  Personal health questionnaire reviewed: yes.   Gynecologic History No LMP recorded. Contraception: none Last Pap: 08/2012. Results were: normal Last mammogram: Pt has an appointment scheduled for next week at [name].  Obstetric History OB History   Grav Para Term Preterm Abortions TAB SAB Ect Mult Living                   The following portions of the patient's history were reviewed and updated as appropriate: allergies, current medications, past family history, past medical history, past social history, past surgical history and problem list.  Review of Systems Pertinent items are noted in HPI.    Objective:    No exam performed today, Consultation visit only..    Assessment:    Healthy female exam.    Plan:    Education reviewed: UTI treatments.  Incontinence.. Follow up in: 3 months. Referred to Urology.

## 2012-10-12 NOTE — Patient Instructions (Signed)
Urinary Incontinence

## 2012-10-13 ENCOUNTER — Emergency Department: Payer: Self-pay | Admitting: Unknown Physician Specialty

## 2012-10-13 LAB — COMPREHENSIVE METABOLIC PANEL
Albumin: 3.5 g/dL (ref 3.4–5.0)
Alkaline Phosphatase: 85 U/L (ref 50–136)
Anion Gap: 7 (ref 7–16)
BUN: 9 mg/dL (ref 7–18)
Calcium, Total: 8.8 mg/dL (ref 8.5–10.1)
Chloride: 108 mmol/L — ABNORMAL HIGH (ref 98–107)
Co2: 27 mmol/L (ref 21–32)
EGFR (Non-African Amer.): >60
Glucose: 78 mg/dL (ref 65–99)
Osmolality: 281 (ref 275–301)
Potassium: 3.8 mmol/L (ref 3.5–5.1)
SGOT(AST): 25 U/L (ref 15–37)

## 2012-10-13 LAB — CBC
HCT: 41.8 % (ref 35.0–47.0)
HGB: 13.8 g/dL (ref 12.0–16.0)
Platelet: 222 10*3/uL (ref 150–440)
RBC: 4.31 10*6/uL (ref 3.80–5.20)
RDW: 15.6 % — ABNORMAL HIGH (ref 11.5–14.5)
WBC: 5.1 10*3/uL (ref 3.6–11.0)

## 2012-10-13 LAB — URINALYSIS, COMPLETE
Bacteria: NONE SEEN
Glucose,UR: NEGATIVE mg/dL (ref 0–75)
Leukocyte Esterase: NEGATIVE
RBC,UR: 9 /HPF (ref 0–5)
Specific Gravity: 1.006 (ref 1.003–1.030)
Squamous Epithelial: <1
WBC UR: 1 /HPF (ref 0–5)

## 2012-10-15 LAB — URINE CULTURE

## 2012-10-17 ENCOUNTER — Ambulatory Visit (HOSPITAL_COMMUNITY)
Admission: RE | Admit: 2012-10-17 | Discharge: 2012-10-17 | Disposition: A | Discharge status: Home / Self Care | Payer: Medicaid Other | Source: Ambulatory Visit | Attending: Obstetrics | Admitting: Obstetrics

## 2012-10-17 DIAGNOSIS — Z1231 Encounter for screening mammogram for malignant neoplasm of breast: Secondary | ICD-10-CM | POA: Insufficient documentation

## 2012-10-25 ENCOUNTER — Emergency Department: Payer: Self-pay | Admitting: Emergency Medicine

## 2012-10-25 LAB — URINALYSIS, COMPLETE
Bacteria: NONE SEEN
Blood: NEGATIVE
Glucose,UR: NEGATIVE mg/dL (ref 0–75)
Ketone: NEGATIVE
Nitrite: NEGATIVE
Ph: 5 (ref 4.5–8.0)
Protein: NEGATIVE
Squamous Epithelial: 2

## 2012-10-25 LAB — COMPREHENSIVE METABOLIC PANEL
Albumin: 3.4 g/dL (ref 3.4–5.0)
Anion Gap: 5 — ABNORMAL LOW (ref 7–16)
Bilirubin,Total: 0.1 mg/dL — ABNORMAL LOW (ref 0.2–1.0)
Calcium, Total: 8.6 mg/dL (ref 8.5–10.1)
Chloride: 110 mmol/L — ABNORMAL HIGH (ref 98–107)
Co2: 28 mmol/L (ref 21–32)
EGFR (Non-African Amer.): >60
Osmolality: 284 (ref 275–301)
Potassium: 4 mmol/L (ref 3.5–5.1)
SGOT(AST): 36 U/L (ref 15–37)
Sodium: 143 mmol/L (ref 136–145)
Total Protein: 6.9 g/dL (ref 6.4–8.2)

## 2012-10-25 LAB — DRUG SCREEN, URINE
Amphetamines, Ur Screen: NEGATIVE (ref ?–1000)
Cannabinoid 50 Ng, Ur ~~LOC~~: NEGATIVE (ref ?–50)
Cocaine Metabolite,Ur ~~LOC~~: NEGATIVE (ref ?–300)
MDMA (Ecstasy)Ur Screen: NEGATIVE (ref ?–500)
Opiate, Ur Screen: NEGATIVE (ref ?–300)

## 2012-10-25 LAB — CBC
MCH: 31.6 pg (ref 26.0–34.0)
MCV: 96 fL (ref 80–100)

## 2012-10-25 LAB — ETHANOL: Ethanol: 52 mg/dL

## 2012-10-25 LAB — PROTIME-INR: Prothrombin Time: 12.7 secs (ref 11.5–14.7)

## 2012-10-29 ENCOUNTER — Emergency Department: Payer: Self-pay | Admitting: Emergency Medicine

## 2012-10-29 LAB — COMPREHENSIVE METABOLIC PANEL
Albumin: 3.2 g/dL — ABNORMAL LOW (ref 3.4–5.0)
Alkaline Phosphatase: 98 U/L (ref 50–136)
Calcium, Total: 9.1 mg/dL (ref 8.5–10.1)
Chloride: 104 mmol/L (ref 98–107)
Co2: 29 mmol/L (ref 21–32)
Creatinine: 0.52 mg/dL — ABNORMAL LOW (ref 0.60–1.30)
EGFR (African American): >60
Glucose: 92 mg/dL (ref 65–99)
Osmolality: 275 (ref 275–301)
Potassium: 3.2 mmol/L — ABNORMAL LOW (ref 3.5–5.1)
SGPT (ALT): 91 U/L — ABNORMAL HIGH (ref 12–78)

## 2012-10-29 LAB — ETHANOL
Ethanol %: 0.118 % — ABNORMAL HIGH (ref 0.000–0.080)
Ethanol: 118 mg/dL

## 2012-10-29 LAB — URINALYSIS, COMPLETE
Bilirubin,UR: NEGATIVE
Ketone: NEGATIVE
Ph: 7 (ref 4.5–8.0)
Protein: NEGATIVE
WBC UR: 2 /HPF (ref 0–5)

## 2012-10-29 LAB — CBC
HCT: 41.3 % (ref 35.0–47.0)
MCH: 31.3 pg (ref 26.0–34.0)
MCHC: 32.9 g/dL (ref 32.0–36.0)

## 2012-11-19 ENCOUNTER — Encounter: Payer: Self-pay | Admitting: Obstetrics

## 2012-11-26 LAB — COMPREHENSIVE METABOLIC PANEL
Anion Gap: 4 — ABNORMAL LOW (ref 7–16)
Bilirubin,Total: 0.5 mg/dL (ref 0.2–1.0)
Calcium, Total: 9 mg/dL (ref 8.5–10.1)
Chloride: 101 mmol/L (ref 98–107)
EGFR (African American): >60
EGFR (Non-African Amer.): >60
Osmolality: 270 (ref 275–301)
Potassium: 3.9 mmol/L (ref 3.5–5.1)
SGOT(AST): 20 U/L (ref 15–37)
SGPT (ALT): 21 U/L (ref 12–78)
Sodium: 134 mmol/L — ABNORMAL LOW (ref 136–145)
Total Protein: 7 g/dL (ref 6.4–8.2)

## 2012-11-26 LAB — CBC WITH DIFFERENTIAL/PLATELET
Eosinophil %: 1.2 %
HGB: 12.8 g/dL (ref 12.0–16.0)
Lymphocyte #: 1.4 10*3/uL (ref 1.0–3.6)
Lymphocyte %: 18.1 %
MCH: 31.9 pg (ref 26.0–34.0)
MCV: 97 fL (ref 80–100)
Monocyte #: 0.7 x10 3/mm (ref 0.2–0.9)
Neutrophil #: 5.5 10*3/uL (ref 1.4–6.5)
Neutrophil %: 71.2 %
RDW: 13.8 % (ref 11.5–14.5)
WBC: 7.7 10*3/uL (ref 3.6–11.0)

## 2012-11-26 LAB — URINALYSIS, COMPLETE
Bacteria: NONE SEEN
Glucose,UR: NEGATIVE mg/dL (ref 0–75)
Nitrite: NEGATIVE
Ph: 6 (ref 4.5–8.0)
RBC,UR: 1 /HPF (ref 0–5)
Specific Gravity: 1.024 (ref 1.003–1.030)
Squamous Epithelial: <1
WBC UR: 2 /HPF (ref 0–5)

## 2012-11-26 LAB — SEDIMENTATION RATE: Erythrocyte Sed Rate: 17 mm/hr (ref 0–30)

## 2012-11-26 LAB — URIC ACID: Uric Acid: 1.3 mg/dL — ABNORMAL LOW (ref 2.6–6.0)

## 2012-11-26 LAB — CK: CK, Total: 51 U/L (ref 21–215)

## 2012-11-27 ENCOUNTER — Inpatient Hospital Stay: Payer: Self-pay | Admitting: Internal Medicine

## 2012-11-27 LAB — PROTIME-INR: Prothrombin Time: 12.5 secs (ref 11.5–14.7)

## 2012-11-28 LAB — CBC WITH DIFFERENTIAL/PLATELET
Basophil %: 0.1 %
Eosinophil #: 0.4 10*3/uL (ref 0.0–0.7)
HCT: 33.4 % — ABNORMAL LOW (ref 35.0–47.0)
Lymphocyte #: 2.2 10*3/uL (ref 1.0–3.6)
MCH: 32.1 pg (ref 26.0–34.0)
Monocyte #: 0.7 x10 3/mm (ref 0.2–0.9)
Neutrophil #: 3.9 10*3/uL (ref 1.4–6.5)
RBC: 3.44 10*6/uL — ABNORMAL LOW (ref 3.80–5.20)
WBC: 7.3 10*3/uL (ref 3.6–11.0)

## 2012-11-28 LAB — BASIC METABOLIC PANEL
Anion Gap: 3 — ABNORMAL LOW (ref 7–16)
BUN: 15 mg/dL (ref 7–18)
Calcium, Total: 8.3 mg/dL — ABNORMAL LOW (ref 8.5–10.1)
Chloride: 103 mmol/L (ref 98–107)
Co2: 30 mmol/L (ref 21–32)
EGFR (African American): >60
EGFR (Non-African Amer.): >60
Sodium: 136 mmol/L (ref 136–145)

## 2012-12-20 ENCOUNTER — Encounter: Payer: Self-pay | Admitting: Obstetrics

## 2013-02-06 ENCOUNTER — Emergency Department: Payer: Self-pay | Admitting: Emergency Medicine

## 2013-02-06 LAB — CBC
HGB: 13.6 g/dL (ref 12.0–16.0)
MCH: 31.9 pg (ref 26.0–34.0)
MCHC: 33.8 g/dL (ref 32.0–36.0)
WBC: 6.4 10*3/uL (ref 3.6–11.0)

## 2013-02-06 LAB — BASIC METABOLIC PANEL
Co2: 32 mmol/L (ref 21–32)
Glucose: 97 mg/dL (ref 65–99)
Potassium: 4.2 mmol/L (ref 3.5–5.1)

## 2013-02-06 LAB — PROTIME-INR: Prothrombin Time: 12.9 secs (ref 11.5–14.7)

## 2013-02-09 ENCOUNTER — Emergency Department: Payer: Self-pay | Admitting: Emergency Medicine

## 2013-02-10 ENCOUNTER — Emergency Department: Payer: Self-pay | Admitting: Emergency Medicine

## 2014-10-16 NOTE — H&P (Signed)
PATIENT NAME:  [name] [name], [name] [name] MR#:  [id number] DATE OF BIRTH:  Nov 03, 1960  DATE OF ADMISSION:  11/27/2012  REFERRING PHYSICIAN:  Dr. Doug Sou.   CHIEF COMPLAINT:  Right leg pain.   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  On Thursday night, the patient fell down while trying to transfer herself from wheelchair to the bed, fell down to the right side, has been experiencing pain in the right leg.  As the pain has been getting worse concerning this and came to the Emergency Department.  Work-up in the Emergency Department, the x-ray showed a proximal tibial and fibular fracture.  The patient is placed in a splint.  The patient currently lives in assisted living facility.    The patient denies having cough, shortness of breath.  Denies loss of consciousness at any given time.   PAST MEDICAL HISTORY: 1.  Diabetes mellitus, not on any medications.  2.  Right partial hip replacement.  3.  Internal rotation of the right hip.  4.  Gout. 5.  CVA four years back.  6.  Appendectomy.  7.  Left total hip replacement.   ALLERGIES:  ATIVAN AND PAXIL.   HOME MEDICATIONS: 1.  Coumadin 3.5 mg daily.  2.  Vitamin D3 5000 units once a day.  3.  VESIcare 5 mg once a day.  4.   80 mg once a day.   5.  Triamcinolone cream apply to the affected area. 6.  Tizanidine 2 mg every 8 hours as needed.  7.   1% topical cream to the affected area.  8.  Refresh ophthalmic drops.  9.  . 10.  Duloxetine 60 mg once a day.  11.  Citracal 0.5 mg once a day.  12.  Biotin 300 mcg once a day.  13.  Baclofen 20 mg 3 times a day.  14.  Aspirin 81 mg daily.  15.  Alprazolam 0.5 mg every 8 hours as needed.  16.  Allopurinol 100 mg once a day.  17.  Acetaminophen 650 mg every 6 hours as needed.   SOCIAL HISTORY:  Smokes 1 pack a day.  Denies drinking  alcohol or using illicit drugs.  Currently lives in assisted living facility.  Does not have children.  Has a mother and brothers and sisters.   FAMILY HISTORY:  Father died of metastatic lung CA.  Mother had tuberculosis.   REVIEW OF SYSTEMS: CONSTITUTIONAL:  Generalized fatigue, weakness.  EYES:  No change in vision.  EARS, NOSE, THROAT:  No change in hearing.  No tinnitus.  RESPIRATORY:  No cough, shortness of breath.  CARDIOVASCULAR:  No chest pain, palpitations. GASTROINTESTINAL:  Has a good appetite, having regular bowel movements.  Denies any nausea, vomiting or diarrhea.  GENITOURINARY:  No dysuria or hematuria.  MUSCULOSKELETAL:  Has generalized body aches, takes pain medication. SKIN:  Gets rash and also has seborrheic keratosis.  HEMATOLOGIC:  No easy bruising or bleeding.  NEUROLOGIC:  Left-sided stroke causing her right hemiplegia.   PHYSICAL EXAMINATION: GENERAL:  This is a thin-built white female, looks much older than stated age, lying down in the bed, not in distress.  VITAL SIGNS:  Temperature 98.9, pulse 98, blood pressure 108/60, respiratory rate of 20, oxygen saturation 97% on room air.  HEENT:  Head normocephalic, atraumatic.  Eyes, no sclerae icterus.  Conjunctivae  normal.  Pupils equal and conjunctivae normal.  Extraocular movements are intact.  Mucous membranes moist.  NECK:  Supple.  No lymphadenopathy.  No JVD.  No carotid bruit.  No thyromegaly.  CHEST:  Has no focal tenderness.  LUNGS:  Bilaterally clear to auscultation.  HEART:  S1, S2, regular.  No murmurs are heard.  ABDOMEN:  Bowel sounds are plus.  Soft, nontender, nondistended.  No hepatosplenomegaly.  EXTREMITIES:  Has somewhat decreased range of motion in the hip joints, knee.  SKIN:  No rash or lesions.   NEUROLOGIC:  The patient is alert, oriented to place, person and time.  No apparent cranial nerve abnormalities.  Motor, right-sided hemiplegia with contractures.   LABORATORY DATA:  X-ray of the  pelvis, no fracture, with a prosthetic hip bilaterally.  A fracture of the femur, fracture of the proximal fibula in the metaphysial portion, likely of the metadiaphysis of the descending tibia.  CMP is completely within normal limits.  UA negative for nitrites and leukocyte esterase.  Knee on the right shows acute fractures of the proximal portions of the right tibia and fibula.   ASSESSMENT AND PLAN:  [name] [name] is a 54 year old female who comes to the Emergency Department after having a fall a few days back with severe pain.  1.  Right tibia, fibula fracture.  The patient states since the fracture the patient has remained in the bed secondary to severe pain as does not get assistance.  We will consult the case management for possible skilled nursing facility placement.  Consult orthopedic surgery, nonweight-bearing activity on the right leg.  Pain management as needed.  Deep vein thrombosis prophylaxis while in the hospital.  2.  History of stroke.  Continue with aspirin and Coumadin.  Per patient, the cause was not determined for the stroke.  3.  The patient is already on therapeutic Coumadin.   TIME SPENT:  40 minutes.     ____________________________ Susa GriffinsPadmaja Miliano Cotten, MD pv:ea D: 11/27/2012 01:14:12 ET T: 11/27/2012 02:21:39 ET JOB#: [id number]  cc: Susa GriffinsPadmaja Deionte Spivack, MD, <Dictator> Susa GriffinsPADMAJA Cyndee Giammarco MD ELECTRONICALLY SIGNED 12/01/2012 19:41

## 2014-10-16 NOTE — Discharge Summary (Signed)
PATIENT NAME:  [name] [name], [name] [name] MR#:  [id number] DATE OF BIRTH:  1961/04/18  DATE OF ADMISSION:  11/27/2012 DATE OF DISCHARGE:  11/29/2012  DISCHARGE DIAGNOSES: 1.  Tibia and fibula fracture. No surgical intervention. Sling applied by ortho. Follow up in a week in clinic.  2.  History of cerebrovascular accident, right-sided weakness.  3.  Hypotension.  4.  Constipation.  5.  History of pulmonary embolism.   CONDITION ON DISCHARGE: Stable.   CODE STATUS: FULL CODE.   DISCHARGE MEDICATIONS: 1.  Aspirin, enteric coated, 81 mg once a day.  2.  Duloxetine 60 mg delayed-release once a day.  3.  Baclofen 20 mg 3 times a day.  4.  Alprazolam 0.5 mg every 8 hours as needed.  5.  VESIcare 5 mg oral tablet once a day.  6.  Allopurinol 100 mg once a day.  7.  Oxycodone 10 mg extended release 2 times a day.  8.  Acetaminophen/hydrocodone every 4 hours as needed for pain.  9.  Calcium carbonate 500 mg 3 times a day.  10.  Lovenox 40 mg subcutaneous injection once a day.  11.  Senna 2 times a day as needed for constipation.  12.  Nicotine 21 mg patch once a day.  13.  Vitamin D3 5000 units once a day.   DIET ON DISCHARGE: Regular. Consistency regular.   ACTIVITY LIMITATIONS: As tolerated.   TIMEFRAME TO FOLLOW UP: Within 1 to 2 weeks with Dr. Hessie Knows in orthopedic clinic.  HISTORY OF PRESENT ILLNESS: This is a 54 year old female with history of CVA 4 years ago, gout and right hemiplegia due to stroke, came to the Emergency Room after having a fall.  The patient fell down while getting transferred from wheelchair to bed, fell down on the right side and experiencing severe pain in right leg.  Found having fracture of the tibia and fibula on the right side, in the Emergency Department. Orthopedic consult was called in and they applied a sling and splint and advised to follow in the clinic. There was no surgical intervention done by orthopedics. So she was admitted to medicine for placement  issues. She remained stable, pain controlled with medication, and placement arrangement was done and she is being discharged to skilled nursing facility for further management.   OTHER MEDICAL ISSUES:   1.  Hyponatremia, which was present on admission due to mild dehydration. Gave IV fluid and corrected.  2.  Severe right-sided weakness. This is old issue. She was taking aspirin. We continued the same.  3.  History of pulmonary embolism. The patient refused for Coumadin after explanation about high risk, especially after having fracture and immobilization. She is extremely high risk for future repeated episodes of pulmonary embolism or DVT, but she refused for blood thinners and so we are discharging on prophylactic dose of Lovenox sub-Q to be given at rehab for DVT prophylaxis.  4.  Hypotension. Blood pressure was slightly on the lower side. After IV fluid remained stable.  5.  Constipation. The patient had severe constipation. No stool for the last 7 days due to pain medication. After Fleet enema she had good bowel movement and she is being discharged to a nursing home.   [place]:  Hessie Knows from orthopedics.   IMPORTANT LABORATORY AND DIAGNOSTICS: In the hospital:  On admission, WBC 7.7, hemoglobin 12.8 and platelet count 208. ESR 17. Creatinine 0.42.  Sodium 134 on presentation. Urinalysis negative.   X-ray of right knee:  Acute fracture of proximal portion of right tibia and fibula.   INR 0.9. Other labs are within normal range while in the hospital.   TOTAL TIME SPENT ON THIS DISCHARGE: 40 minutes.   ____________________________ Ceasar Lund Anselm Jungling, MD vgv:sb D: 11/29/2012 16:19:12 ET T: 11/29/2012 16:44:36 ET JOB#: [id number]  cc: Ceasar Lund. Anselm Jungling, MD, <Dictator> Laurene Footman, MD Vaughan Basta MD ELECTRONICALLY SIGNED 12/09/2012 22:09

## 2014-10-16 NOTE — Consult Note (Signed)
Brief Consult Note: Diagnosis: right tibi/fib fractures.   Patient was seen by consultant.   Comments: nonoperative treatment continue current splint for a week, plan on hinged brace application in office will add long acting narcotic, patient has taken chronic noarcotics upt to 3 months ago and probably has signicant tolerance..  Electronic Signatures: Leitha SchullerMenz, Carold Eisner J (MD)  (Signed 04-Jun-14 12:43)  Authored: Brief Consult Note   Last Updated: 04-Jun-14 12:43 by Leitha SchullerMenz, Dejanae Helser J (MD)

## 2014-10-16 NOTE — Consult Note (Signed)
PATIENT NAMDeretha Emory:  Haupert, Karle L MR#:  [id number] DATE OF BIRTH:  08/29/1960  DATE OF CONSULTATION:  11/27/2012  CONSULTING PHYSICIAN:  Leitha SchullerMichael J. Delayni Streed, MD  REASON FOR CONSULTATION: Right leg tibia-fibula fractures.   HISTORY OF PRESENT ILLNESS: The patient is a 54 year old female who has a history of prior CVA with profound right hemiparesis. She apparently slipped out of her chair on May 30th and did not want to move, was left in bed and subsequently had enough pain that she was willing to be brought to the Emergency Room. On the Emergency Room evaluation, she was noted to have a proximal tib-fib fracture with severe osteopenia. She reports that she does not ambulate on this leg. It tends to be in flexion with some spasticity. She uses her left leg for transfers. It does not go all the way straight. It tends to be held in a flexed position as when she is sitting. She also has significant right upper extremity weakness.   EXAMINATION:  EXTREMITIES: She has minimal ability to flex and extend the toes. She has edema in the lower extremity. She is in a splint with the leg in 90 degrees flexion. Sensation is diminished to the right lower extremity.   IMAGING: X-rays reveal a proximal tibia and fibula fracture, oblique, with acceptable alignment.   CLINICAL IMPRESSION: Minimally displaced proximal tibia-fibula fracture, extra-articular, in a patient who does not bear weight on this extremity.   RECOMMENDATION: Nonoperative treatment. Continue current splint for a week, and then will get her back in the office. Probably will be able to fit her to a hinged brace with locking it for the first 3 to 4 weeks in flexion, and later as it heals, start some early motion so she does not get too severe of a knee flexion contracture. This was discussed with the patient. Additionally, she reported to me that she had previously been taking a lot of chronic pain medication. She recently came off of that in February or  March of this year, and she is having severe pain with this. I suspect she has built up a tolerance previously and have ordered OxyContin 15 mg b.i.d. as a baseline to help with her pain she suffered from this fracture. For activity, all she will be able to do is transfers with nonweightbearing on the right leg.   ____________________________ Leitha SchullerMichael J. Bern Fare, MD mjm:OSi D: 11/28/2012 07:25:00 ET T: 11/28/2012 07:37:56 ET JOB#: [id number]  cc: Leitha SchullerMichael J. Lynden Carrithers, MD, <Dictator> Leitha SchullerMICHAEL J Gerardo Territo MD ELECTRONICALLY SIGNED 11/28/2012 12:45

## 2016-01-17 NOTE — Progress Notes (Signed)
This encounter was created in error - please disregard.

## 2017-08-16 ENCOUNTER — Ambulatory Visit (INDEPENDENT_AMBULATORY_CARE_PROVIDER_SITE_OTHER): Payer: Medicaid Other | Admitting: Vascular Surgery

## 2017-08-16 ENCOUNTER — Encounter: Payer: Self-pay | Admitting: Vascular Surgery

## 2017-08-16 VITALS — BP 84/60 | HR 88 | Temp 97.2°F | Resp 16 | Wt 125.0 lb

## 2017-08-16 DIAGNOSIS — M79604 Pain in right leg: Secondary | ICD-10-CM

## 2017-08-16 NOTE — Progress Notes (Addendum)
HISTORY AND PHYSICAL     CC:  Right leg pain Requesting Provider:  Nash Shearer, MD  HPI: This is a 57 y.o. female who has a hx of a right hip replacement in 2009, a right tibia/fibula fracture in 2014 and what sounds like a broken femur at the end of 2017/beginning of 2018.   She states she has had right hip pain since her hip replacement.  She does get pain with movement and even some while she is sitting in her wheelchair but not at this moment.  She does get some pain when she is out of the wheelchair and in bed.  She denies any non healing wounds on either of her feet.  She does have a hx of stroke with right sided hemiparesis.  She is wheelchair bound and unable to ambulate.   She takes a daily aspirin.  She states that she is on blood thinner for her circulation and to prevent blood clots.   Past Medical History:  Diagnosis Date  . Anxiety   . Bipolar disorder (HCC)   . Chronic pain   . CVA (cerebral vascular accident) [facility]) Jul 22, 1999   right side  . Depression   . Gout   . Osteoporosis   . Pap smear for cervical cancer screening    5+ years ago normal  . Pulmonary embolism (HCC)    bilateral  . Tobacco abuse   . Vitamin D deficiency     Past Surgical History:  Procedure Laterality Date  . APPENDECTOMY    . PARTIAL HIP ARTHROPLASTY Right October 2001  . TOTAL HIP ARTHROPLASTY Left June 2000    Allergies  Allergen Reactions  . Ativan [Lorazepam]   . Bactrim [Sulfamethoxazole-Trimethoprim]   . Paxil [Paroxetine Hcl]     Current Outpatient Medications  Medication Sig Dispense Refill  . acetaminophen (TYLENOL) 325 MG tablet Take 650 mg by mouth every 6 (six) hours as needed for pain.    Marland Kitchen alendronate (FOSAMAX) 70 MG tablet Take 70 mg by mouth every 7 (seven) days. Take with a full glass of water on an empty stomach.    Marland Kitchen allopurinol (ZYLOPRIM) 100 MG tablet Take 100 mg by mouth daily.    Marland Kitchen ALPRAZolam (XANAX) 0.5 MG tablet Take 0.5 mg by mouth every 8 (eight)  hours as needed for sleep.    Marland Kitchen alum & mag hydroxide-simeth (MAALOX/MYLANTA) 200-200-20 MG/5ML suspension Take by mouth as needed for indigestion.    Marland Kitchen aspirin 81 MG tablet Take 81 mg by mouth daily.    . baclofen (LIORESAL) 20 MG tablet Take 20 mg by mouth 3 (three) times daily.    . Biotin 1000 MCG tablet Take 3,000 mcg by mouth 2 (two) times daily.    . carboxymethylcellulose (REFRESH PLUS) 0.5 % SOLN Place 2 drops into both eyes 4 (four) times daily as needed.    . cefUROXime (CEFTIN) 500 MG tablet Take 1 tablet (500 mg total) by mouth 2 (two) times daily. 14 tablet 2  . Cholecalciferol (VITAMIN D-3) 1000 UNITS CAPS Take 1 capsule by mouth daily.    . DULoxetine (CYMBALTA) 60 MG capsule Take 60 mg by mouth daily.    . hydrocortisone (ANUSOL-HC) 2.5 % rectal cream Place 1 application rectally every 6 (six) hours as needed for hemorrhoids.    . Multiple Vitamins-Minerals (THERA VITAL M PO) Take 1 tablet by mouth daily.    Marland Kitchen oxyCODONE (OXYCONTIN) 10 MG 12 hr tablet Take 10 mg by mouth every  12 (twelve) hours.    . polyethylene glycol (MIRALAX / GLYCOLAX) packet Take 17 g by mouth 2 (two) times daily.    . ranitidine (ZANTAC) 150 MG tablet Take 150 mg by mouth 2 (two) times daily.    Marland Kitchen senna-docusate (SENOKOT-S) 8.6-50 MG per tablet Take 2 tablets by mouth at bedtime.    . Sunscreens (COPPERTONE SPORT FACES SPF50 EX) Apply topically as needed.    Marland Kitchen tiZANidine (ZANAFLEX) 2 MG tablet Take 2 mg by mouth every 6 (six) hours as needed.    . triamcinolone cream (KENALOG) 0.1 % Apply 1 application topically daily.     No current facility-administered medications for this visit.     Family History  Problem Relation Age of Onset  . Stroke Mother   . Stroke Brother   . Hypertension Brother   . Heart murmur Brother     Social History   Socioeconomic History  . Marital status: Divorced    Spouse name: Not on file  . Number of children: Not on file  . Years of education: Not on file  .  Highest education level: Not on file  Social Needs  . Financial resource strain: Not on file  . Food insecurity - worry: Not on file  . Food insecurity - inability: Not on file  . Transportation needs - medical: Not on file  . Transportation needs - non-medical: Not on file  Occupational History  . Not on file  Tobacco Use  . Smoking status: Current Some Day Smoker    Packs/day: 0.50    Types: Cigarettes  . Smokeless tobacco: Never Used  Substance and Sexual Activity  . Alcohol use: Yes    Comment: Varies   . Drug use: Yes    Types: Marijuana  . Sexual activity: No  Other Topics Concern  . Not on file  Social History Narrative  . Not on file     REVIEW OF SYSTEMS:   [X]  denotes positive finding, [ ]  denotes negative finding Cardiac  Comments:  Chest pain or chest pressure:    Shortness of breath upon exertion:    Short of breath when lying flat:    Irregular heart rhythm:        Vascular    Pain in calf, thigh, or hip brought on by ambulation: x See HPI  Pain in feet at night that wakes you up from your sleep:     Blood clot in your veins:    Leg swelling:         Pulmonary    Oxygen at home:    Productive cough:     Wheezing:         Neurologic    weakness in arms or legs:  x   numbness in arms or legs:  x   Sudden onset of difficulty speaking or slurred speech:    Temporary loss of vision in one eye:     Problems with dizziness:         Gastrointestinal    Blood in stool:     Vomited blood:         Genitourinary    Burning when urinating:     Blood in urine:        Psychiatric    Major depression:  x On [place]      Hematologic    Bleeding problems:    Problems with blood clotting too easily:        Skin    Rashes  or ulcers:        Constitutional    Fever or chills:      PHYSICAL EXAMINATION:  Vitals:   08/16/17 1310  BP: (!) 84/60  Pulse: 88  Resp: 16  Temp: (!) 97.2 F (36.2 C)   Vitals:   08/16/17 1310  Weight: 125 lb (56.7  kg)   Body mass index is 19.58 kg/m.  General:  WDWN in NAD; vital signs documented above Gait: Not observed HENT: WNL, normocephalic Pulmonary: normal non-labored breathing , without Rales, rhonchi,  wheezing Cardiac: regular HR, without  Murmurs without carotid bruits Abdomen: soft, NT, no masses Skin: without rashes Vascular Exam/Pulses:  Right Left  Radial 2+ (normal) 2+ (normal)  Femoral 2+ (normal) 2+ (normal)  DP 2+ (normal) 2+ (normal)  PT Unable to palpate  Unable to palpate    Extremities: flexion contracture of the right knee and ankle as well as the right elbow and wrist.  She does have some purple discoloration of the toes on the right foot.  Musculoskeletal: no muscle wasting or atrophy  Neurologic: A&O X 3;  No focal weakness or paresthesias are detected Psychiatric:  The pt has Normal affect.   Non-Invasive Vascular Imaging:   None today  Pt meds includes: Statin:  No. Beta Blocker:  No. Aspirin:  Yes.   ACEI:  No. ARB:  No. CCB use:  No Other Antiplatelet/Anticoagulant:  Yes  Xarelto   ASSESSMENT/PLAN:: 57 y.o. female with right thigh pain    -pt has palpable DP & femoral pulses bilaterally - Do not feel the pt's leg pain is vascular in nature.  She states she is on a blood thinner for her circulation & to prevent DVT--from an arterial standpoint, she does not need blood thinner.  -discoloration in toes is likely due to loss of elasticity of her blood vessels in her leg due to her stroke and has progressed over time.   -she will f/u with [facility] as needed.   Doreatha MassedSamantha Rhyne, PA-C [facility] [phone]  Clinic MD:  Pt seen and examined with Dr. Darrick PennaFields  History and exam findings as above.  The patient was evaluated for similar findings in December 2011 by my partner Dr. Hart RochesterLawson.  At that point she had normal arterial circulation.  She has an easily palpable dorsalis pedis pulse in both of her feet today.  The bluish discoloration in  her foot is secondary to autonomic dysfunction most likely secondary to her stroke.  I do not believe we have any explanation for her right thigh pain from a vascular standpoint.  She will follow-up on an as-needed basis.  From our standpoint I do not believe that she needs long-term anticoagulation.  Fabienne Brunsharles Fields, MD [facility] [place] Office: [phone] Pager: [phone]

## 2017-08-20 ENCOUNTER — Encounter: Payer: Self-pay | Admitting: Family

## 2017-11-21 ENCOUNTER — Emergency Department (HOSPITAL_COMMUNITY): Payer: Medicaid Other

## 2017-11-21 ENCOUNTER — Inpatient Hospital Stay (HOSPITAL_COMMUNITY)
Admission: EM | Admit: 2017-11-21 | Discharge: 2017-11-27 | DRG: 689 | Disposition: A | Discharge status: Skilled Nursing Facility | Payer: Medicaid Other | Source: Skilled Nursing Facility | Attending: Internal Medicine | Admitting: Internal Medicine

## 2017-11-21 ENCOUNTER — Encounter (HOSPITAL_COMMUNITY): Payer: Self-pay | Admitting: Radiology

## 2017-11-21 ENCOUNTER — Other Ambulatory Visit: Payer: Self-pay

## 2017-11-21 DIAGNOSIS — Z8673 Personal history of transient ischemic attack (TIA), and cerebral infarction without residual deficits: Secondary | ICD-10-CM

## 2017-11-21 DIAGNOSIS — I69351 Hemiplegia and hemiparesis following cerebral infarction affecting right dominant side: Secondary | ICD-10-CM | POA: Diagnosis not present

## 2017-11-21 DIAGNOSIS — N390 Urinary tract infection, site not specified: Secondary | ICD-10-CM | POA: Diagnosis present

## 2017-11-21 DIAGNOSIS — Z7901 Long term (current) use of anticoagulants: Secondary | ICD-10-CM

## 2017-11-21 DIAGNOSIS — R4182 Altered mental status, unspecified: Secondary | ICD-10-CM | POA: Diagnosis present

## 2017-11-21 DIAGNOSIS — E559 Vitamin D deficiency, unspecified: Secondary | ICD-10-CM | POA: Diagnosis present

## 2017-11-21 DIAGNOSIS — Z9119 Patient's noncompliance with other medical treatment and regimen: Secondary | ICD-10-CM

## 2017-11-21 DIAGNOSIS — Z66 Do not resuscitate: Secondary | ICD-10-CM | POA: Diagnosis present

## 2017-11-21 DIAGNOSIS — Z9181 History of falling: Secondary | ICD-10-CM

## 2017-11-21 DIAGNOSIS — G894 Chronic pain syndrome: Secondary | ICD-10-CM | POA: Diagnosis present

## 2017-11-21 DIAGNOSIS — Z888 Allergy status to other drugs, medicaments and biological substances status: Secondary | ICD-10-CM

## 2017-11-21 DIAGNOSIS — Z86711 Personal history of pulmonary embolism: Secondary | ICD-10-CM | POA: Diagnosis not present

## 2017-11-21 DIAGNOSIS — Z720 Tobacco use: Secondary | ICD-10-CM | POA: Diagnosis not present

## 2017-11-21 DIAGNOSIS — F1010 Alcohol abuse, uncomplicated: Secondary | ICD-10-CM | POA: Diagnosis not present

## 2017-11-21 DIAGNOSIS — Z96643 Presence of artificial hip joint, bilateral: Secondary | ICD-10-CM | POA: Diagnosis present

## 2017-11-21 DIAGNOSIS — N3001 Acute cystitis with hematuria: Secondary | ICD-10-CM

## 2017-11-21 DIAGNOSIS — M810 Age-related osteoporosis without current pathological fracture: Secondary | ICD-10-CM | POA: Diagnosis present

## 2017-11-21 DIAGNOSIS — E860 Dehydration: Secondary | ICD-10-CM | POA: Diagnosis present

## 2017-11-21 DIAGNOSIS — K5900 Constipation, unspecified: Secondary | ICD-10-CM | POA: Diagnosis present

## 2017-11-21 DIAGNOSIS — G9341 Metabolic encephalopathy: Secondary | ICD-10-CM | POA: Diagnosis present

## 2017-11-21 DIAGNOSIS — Z87440 Personal history of urinary (tract) infections: Secondary | ICD-10-CM

## 2017-11-21 DIAGNOSIS — G9340 Encephalopathy, unspecified: Secondary | ICD-10-CM | POA: Diagnosis not present

## 2017-11-21 DIAGNOSIS — M79606 Pain in leg, unspecified: Secondary | ICD-10-CM

## 2017-11-21 DIAGNOSIS — F319 Bipolar disorder, unspecified: Secondary | ICD-10-CM | POA: Diagnosis present

## 2017-11-21 DIAGNOSIS — Z823 Family history of stroke: Secondary | ICD-10-CM | POA: Diagnosis not present

## 2017-11-21 LAB — I-STAT TROPONIN, ED: TROPONIN I, POC: 0.01 ng/mL (ref 0.00–0.08)

## 2017-11-21 LAB — CBC WITH DIFFERENTIAL/PLATELET
Basophils Absolute: 0 10*3/uL (ref 0.0–0.1)
Basophils Relative: 0 %
EOS PCT: 0 %
Eosinophils Absolute: 0 10*3/uL (ref 0.0–0.7)
HCT: 47.7 % — ABNORMAL HIGH (ref 36.0–46.0)
Hemoglobin: 16 g/dL — ABNORMAL HIGH (ref 12.0–15.0)
LYMPHS ABS: 2 10*3/uL (ref 0.7–4.0)
LYMPHS PCT: 25 %
MCH: 31.7 pg (ref 26.0–34.0)
MCHC: 33.5 g/dL (ref 30.0–36.0)
MCV: 94.5 fL (ref 78.0–100.0)
MONO ABS: 0.5 10*3/uL (ref 0.1–1.0)
Monocytes Relative: 7 %
Neutro Abs: 5.4 10*3/uL (ref 1.7–7.7)
Neutrophils Relative %: 68 %
PLATELETS: 258 10*3/uL (ref 150–400)
RBC: 5.05 MIL/uL (ref 3.87–5.11)
RDW: 13.7 % (ref 11.5–15.5)
WBC: 7.9 10*3/uL (ref 4.0–10.5)

## 2017-11-21 LAB — COMPREHENSIVE METABOLIC PANEL
ALBUMIN: 4.1 g/dL (ref 3.5–5.0)
ALT: 18 U/L (ref 14–54)
AST: 23 U/L (ref 15–41)
Alkaline Phosphatase: 88 U/L (ref 38–126)
Anion gap: 12 (ref 5–15)
BILIRUBIN TOTAL: 0.8 mg/dL (ref 0.3–1.2)
BUN: 14 mg/dL (ref 6–20)
CHLORIDE: 101 mmol/L (ref 101–111)
CO2: 30 mmol/L (ref 22–32)
CREATININE: 0.56 mg/dL (ref 0.44–1.00)
Calcium: 9.8 mg/dL (ref 8.9–10.3)
GFR calc Af Amer: >60 mL/min (ref >60)
Glucose, Bld: 113 mg/dL — ABNORMAL HIGH (ref 65–99)
POTASSIUM: 4.8 mmol/L (ref 3.5–5.1)
Sodium: 143 mmol/L (ref 135–145)
Total Protein: 8.1 g/dL (ref 6.5–8.1)

## 2017-11-21 LAB — URINALYSIS, ROUTINE W REFLEX MICROSCOPIC
Bilirubin Urine: NEGATIVE
Glucose, UA: NEGATIVE mg/dL
Ketones, ur: 20 mg/dL — AB
Nitrite: NEGATIVE
PROTEIN: 30 mg/dL — AB
SPECIFIC GRAVITY, URINE: 1.018 (ref 1.005–1.030)
WBC, UA: >50 WBC/hpf — ABNORMAL HIGH (ref 0–5)
pH: 8 (ref 5.0–8.0)

## 2017-11-21 LAB — I-STAT CG4 LACTIC ACID, ED
Lactic Acid, Venous: 2.28 mmol/L (ref 0.5–1.9)
Lactic Acid, Venous: 2.25 mmol/L (ref 0.5–1.9)

## 2017-11-21 MED ORDER — POLYVINYL ALCOHOL 1.4 % OP SOLN: 2.0000 [drp] | OPHTHALMIC | Status: DC | PRN

## 2017-11-21 MED ORDER — ALLOPURINOL 100 MG PO TABS
100.0000 mg | ORAL_TABLET | Freq: Every day | ORAL | Status: DC
  Administered 2017-11-23 – 2017-11-27 (×5): 100 mg via ORAL
  Filled 2017-11-21 (×6): qty 1

## 2017-11-21 MED ORDER — SODIUM CHLORIDE 0.9 % IV SOLN
1.0000 g | INTRAVENOUS | Status: DC
  Administered 2017-11-21 – 2017-11-25 (×5): 1 g via INTRAVENOUS
  Filled 2017-11-21 (×3): qty 1
  Filled 2017-11-21 (×2): qty 10
  Filled 2017-11-21: qty 1

## 2017-11-21 MED ORDER — HYPROMELLOSE 0.4 % OP SOLN
2.0000 [drp] | OPHTHALMIC | Status: DC | PRN
Start: 2017-11-21 — End: 2017-11-21

## 2017-11-21 MED ORDER — ADULT MULTIVITAMIN W/MINERALS CH
1.0000 | ORAL_TABLET | Freq: Every day | ORAL | Status: DC
  Administered 2017-11-23 – 2017-11-27 (×5): 1 via ORAL
  Filled 2017-11-21 (×6): qty 1

## 2017-11-21 MED ORDER — FOLIC ACID 1 MG PO TABS
1.0000 mg | ORAL_TABLET | Freq: Every day | ORAL | Status: DC
  Administered 2017-11-23 – 2017-11-27 (×5): 1 mg via ORAL
  Filled 2017-11-21 (×6): qty 1

## 2017-11-21 MED ORDER — ASPIRIN EC 81 MG PO TBEC
81.0000 mg | DELAYED_RELEASE_TABLET | Freq: Every day | ORAL | Status: DC
  Administered 2017-11-23 – 2017-11-27 (×5): 81 mg via ORAL
  Filled 2017-11-21 (×6): qty 1

## 2017-11-21 MED ORDER — BACLOFEN 10 MG PO TABS
10.0000 mg | ORAL_TABLET | Freq: Four times a day (QID) | ORAL | Status: DC
  Filled 2017-11-21: qty 1

## 2017-11-21 MED ORDER — RIVAROXABAN 2.5 MG PO TABS
2.5000 mg | ORAL_TABLET | Freq: Every evening | ORAL | Status: DC
  Filled 2017-11-21 (×2): qty 1

## 2017-11-21 MED ORDER — SODIUM CHLORIDE 0.9 % IV BOLUS
2000.0000 mL | Freq: Once | INTRAVENOUS | Status: AC
  Administered 2017-11-21: 2000 mL via INTRAVENOUS

## 2017-11-21 MED ORDER — OXYCODONE HCL 5 MG PO TABS
5.0000 mg | ORAL_TABLET | Freq: Four times a day (QID) | ORAL | Status: DC | PRN
  Administered 2017-11-23: 5 mg via ORAL
  Administered 2017-11-24 – 2017-11-27 (×11): 10 mg via ORAL
  Filled 2017-11-21 (×5): qty 2
  Filled 2017-11-21: qty 1
  Filled 2017-11-21 (×6): qty 2

## 2017-11-21 MED ORDER — GABAPENTIN 100 MG PO CAPS
100.0000 mg | ORAL_CAPSULE | Freq: Three times a day (TID) | ORAL | Status: DC
  Administered 2017-11-21 – 2017-11-27 (×15): 100 mg via ORAL
  Filled 2017-11-21 (×15): qty 1

## 2017-11-21 MED ORDER — THIAMINE HCL 100 MG/ML IJ SOLN
100.0000 mg | Freq: Every day | INTRAMUSCULAR | Status: DC
  Administered 2017-11-22: 100 mg via INTRAVENOUS
  Filled 2017-11-21: qty 2

## 2017-11-21 MED ORDER — SODIUM CHLORIDE 0.9 % IV SOLN
INTRAVENOUS | Status: DC
  Administered 2017-11-21 (×2): via INTRAVENOUS

## 2017-11-21 MED ORDER — VITAMIN B-1 100 MG PO TABS
100.0000 mg | ORAL_TABLET | Freq: Every day | ORAL | Status: DC
  Filled 2017-11-21: qty 1

## 2017-11-21 MED ORDER — NICOTINE 7 MG/24HR TD PT24
7.0000 mg | MEDICATED_PATCH | Freq: Every day | TRANSDERMAL | Status: DC
  Administered 2017-11-24 – 2017-11-26 (×3): 7 mg via TRANSDERMAL
  Filled 2017-11-21 (×6): qty 1

## 2017-11-21 MED ORDER — LORAZEPAM 1 MG PO TABS: 1.0000 mg | ORAL_TABLET | Freq: Four times a day (QID) | ORAL | Status: AC | PRN

## 2017-11-21 MED ORDER — LORATADINE 10 MG PO TABS
10.0000 mg | ORAL_TABLET | Freq: Every day | ORAL | Status: DC
Start: 2017-11-21 — End: 2017-11-27
  Administered 2017-11-23 – 2017-11-27 (×5): 10 mg via ORAL
  Filled 2017-11-21 (×6): qty 1

## 2017-11-21 MED ORDER — FLUTICASONE PROPIONATE 50 MCG/ACT NA SUSP
1.0000 | Freq: Every day | NASAL | Status: DC
  Administered 2017-11-23 – 2017-11-24 (×2): 1 via NASAL
  Filled 2017-11-21: qty 16

## 2017-11-21 MED ORDER — SODIUM CHLORIDE 0.9 % IV SOLN: 1.0000 g | Freq: Once | INTRAVENOUS | Status: DC

## 2017-11-21 MED ORDER — LORAZEPAM 2 MG/ML IJ SOLN: 1.0000 mg | Freq: Four times a day (QID) | INTRAMUSCULAR | Status: AC | PRN

## 2017-11-21 MED ORDER — FLUCONAZOLE 200 MG PO TABS
200.0000 mg | ORAL_TABLET | Freq: Every day | ORAL | Status: DC
  Administered 2017-11-23 – 2017-11-26 (×4): 200 mg via ORAL
  Filled 2017-11-21 (×3): qty 2
  Filled 2017-11-21 (×3): qty 1
  Filled 2017-11-21: qty 2
  Filled 2017-11-21 (×3): qty 1

## 2017-11-21 MED ORDER — DULOXETINE HCL 30 MG PO CPEP
60.0000 mg | ORAL_CAPSULE | Freq: Every day | ORAL | Status: DC
  Administered 2017-11-23 – 2017-11-27 (×5): 60 mg via ORAL
  Filled 2017-11-21 (×6): qty 2

## 2017-11-21 NOTE — ED Triage Notes (Signed)
Transported by [facility] from [facility] staff found patient wedged between bed and dresser at 7 am. Staff also states that patient has been "altered" x 2 days and reports that she has been moaning/groaning in pain. EMS reports AAO x 1 and staff reports that this is not baseline. Hx of CVA and pt has right sided deficits. Pt is currently on Xarelto. Hematoma to occipital area of scalp.

## 2017-11-21 NOTE — Care Management Note (Signed)
Case Management Note  Patient Details  Name: [name] [name] MRN: [id number] Date of Birth: 1960/07/03  Subjective/Objective:  57 y/o f admitted w/AMS. Per nsg adm summary from SNF-CSW notified. PT cons-await recc.                  Action/Plan:d/c SNF.   Expected Discharge Date:  (unknown)               Expected Discharge Plan:  Skilled Nursing Facility  In-House Referral:  Clinical Social Work  Discharge planning Services  CM Consult  Post Acute Care Choice:    Choice offered to:     DME Arranged:    DME Agency:     HH Arranged:    HH Agency:     Status of Service:  In process, will continue to follow  If discussed at Long Length of Stay Meetings, dates discussed:    Additional Comments:  Lanier Clam, RN 11/21/2017, 1:43 PM

## 2017-11-21 NOTE — Progress Notes (Signed)
Pt is refusing all medications. Could only get IV meds in through her NS line. Pt will no not let me do a head to toe assessment. I will chart what I can assessment wise from what I can see. Pt refusing to wear cardiac monitor or to let me place a DNR band on her arm.

## 2017-11-21 NOTE — ED Notes (Signed)
Bed: WA15 Expected date:  Expected time:  Means of arrival:  Comments: EMS-AMS 

## 2017-11-21 NOTE — Progress Notes (Signed)
Patient arrived to unit. Pt refuses to have temperature taken. Pt is combative at this time, will have primary rn reassess. Pt answers all questions with "NO". Unable to evaluate orientation.

## 2017-11-21 NOTE — H&P (Signed)
History and Physical    [name] [name] HYQ:[id number] DOB: 1961-03-08 DOA: 11/21/2017  PCP: Nash Shearer, MD Patient coming from: SNF  Chief Complaint: Altered mental status  HPI: [name] [name] is a 57 y.o. female with medical history significant of recurrent urinary tract infections, CVA, alcohol abuse, tobacco abuse.  Patient presented from SNF secondary to altered mental status.  Patient was found down in her room.  Patient is unable to provide any history secondary to her altered mental status.  Per discussion with nurse at skilled nursing facility, patient was her normal self yesterday.  ED Course: Vitals: Afebrile, normal pulse, normal respirations, normotensive, on room air. Labs: Lactic acid of 2.28, hemoglobin of 16, urinalysis/microscopy significant for rare bacteria, budding yeast, significant RBC/squamous cell/WBCs Imaging: CT head and cervical spine significant for chronic ischemia in addition to moderate multilevel degenerative disc disease and cervical spine. Medications/Course: 2 L normal saline bolus  Review of Systems: Review of Systems  Unable to perform ROS: Mental status change    Past Medical History:  Diagnosis Date  . Anxiety   . Bipolar disorder (HCC)   . Chronic pain   . CVA (cerebral vascular accident) [facility]) Jul 22, 1999   right side  . Depression   . Gout   . Osteoporosis   . Pap smear for cervical cancer screening    5+ years ago normal  . Pulmonary embolism (HCC)    bilateral  . Tobacco abuse   . Vitamin D deficiency     Past Surgical History:  Procedure Laterality Date  . APPENDECTOMY    . PARTIAL HIP ARTHROPLASTY Right October 2001  . TOTAL HIP ARTHROPLASTY Left June 2000     reports that she has been smoking cigarettes.  She has been smoking about 0.50 packs per day. She has never used smokeless tobacco. She reports that she drinks alcohol. She reports that she has current or past drug history. Drug: Marijuana.  Allergies  Allergen  Reactions  . Ativan [Lorazepam]   . Bactrim [Sulfamethoxazole-Trimethoprim]     Not On MAR  . Paxil [Paroxetine Hcl]     Not on MAR    Family History  Problem Relation Age of Onset  . Stroke Mother   . Stroke Brother   . Hypertension Brother   . Heart murmur Brother     Prior to Admission medications   Medication Sig Start Date End Date Taking? Authorizing Provider  acetaminophen (TYLENOL) 325 MG tablet Take 650 mg by mouth every 6 (six) hours as needed for mild pain or fever.    Yes [provider]  allopurinol (ZYLOPRIM) 100 MG tablet Take 100 mg by mouth daily.   Yes [provider]  aspirin 81 MG tablet Take 81 mg by mouth daily.   Yes [provider]  baclofen (LIORESAL) 20 MG tablet Take 20 mg by mouth every 6 (six) hours as needed (chronic pain).    Yes [provider]  benzonatate (TESSALON) 100 MG capsule Take 100 mg by mouth every 8 (eight) hours as needed for cough.   Yes [provider]  Calcium Carbonate-Vitamin D3 (CALCIUM 600-D) 600-400 MG-UNIT TABS Take 1 tablet by mouth 2 (two) times daily.   Yes [provider]  cetirizine (ZYRTEC) 10 MG tablet Take 10 mg by mouth daily.   Yes [provider]  cycloSPORINE (RESTASIS) 0.05 % ophthalmic emulsion Place 1 drop into both eyes 2 (two) times daily.    Yes [provider]  DULoxetine (CYMBALTA) 60 MG capsule Take 60 mg by mouth daily.   Yes [provider]  fluticasone (FLONASE) 50 MCG/ACT nasal spray Place 1 spray into both nostrils at bedtime.   Yes [provider]  gabapentin (NEURONTIN) 300 MG capsule Take 300 mg by mouth 3 (three) times daily.   Yes [provider]  Hypromellose (ARTIFICIAL TEARS) 0.4 % SOLN Place 2 drops into both eyes every 4 (four) hours as needed (dry eyes).   Yes [provider]  Melatonin 5 MG TABS Take 5 mg by mouth at bedtime.   Yes [provider]  methenamine (MANDELAMINE) 1 g  tablet Take 1,000 mg by mouth 2 (two) times daily.   Yes [provider]  metroNIDAZOLE (METROGEL) 1 % gel Apply 1 application topically daily.   Yes [provider]  nitrofurantoin, macrocrystal-monohydrate, (MACROBID) 100 MG capsule Take 100 mg by mouth at bedtime.   Yes [provider]  Oxycodone HCl 10 MG TABS Take 10 mg by mouth every 6 (six) hours as needed (pain).   Yes [provider]  rivaroxaban (XARELTO) 2.5 MG TABS tablet Take 2.5 mg by mouth every evening.   Yes [provider]  tiZANidine (ZANAFLEX) 4 MG tablet Take 4 mg by mouth every 8 (eight) hours as needed for muscle spasms.    Yes [provider]    Physical Exam: Vitals:   11/21/17 1019 11/21/17 1030 11/21/17 1045 11/21/17 1126  BP: (!) 135/96 111/80 124/75 (!) 115/59  Pulse: 89 84 75 86  Resp: 18 (!) 24 14 (!) 22  Temp:      TempSrc:      SpO2: 100% 100% 100% 100%     Constitutional: NAD, calm, comfortable Eyes: PERRL, lids and conjunctivae normal ENMT: Mucous membranes are dry. Neck: Could not perform Respiratory: Normal respiratory effort. No accessory muscle use.  Cardiovascular: Could not perform Abdomen: Could not perform  Musculoskeletal: Did not notice muscle wasting Neurologic: Did not move right arm, otherwise, could not perform Psychiatric: Impaired judgement/insight. Alert. Answers "no, no, no" to most questioning. Not oriented to situation or time, but knows she is in [place]. Not oriented to her being in the hospital.   Labs on Admission: I have personally reviewed following labs and imaging studies  CBC: Recent Labs  Lab 11/21/17 0846  WBC 7.9  NEUTROABS 5.4  HGB 16.0*  HCT 47.7*  MCV 94.5  PLT 258   Basic Metabolic Panel: Recent Labs  Lab 11/21/17 0846  NA 143  K 4.8  CL 101  CO2 30  GLUCOSE 113*  BUN 14  CREATININE 0.56  CALCIUM 9.8   GFR: CrCl cannot be calculated (Unknown ideal weight.). Liver Function  Tests: Recent Labs  Lab 11/21/17 0846  AST 23  ALT 18  ALKPHOS 88  BILITOT 0.8  PROT 8.1  ALBUMIN 4.1   No results for input(s): LIPASE, AMYLASE in the last 168 hours. No results for input(s): AMMONIA in the last 168 hours. Coagulation Profile: No results for input(s): INR, PROTIME in the last 168 hours. Cardiac Enzymes: No results for input(s): CKTOTAL, CKMB, CKMBINDEX, TROPONINI in the last 168 hours. BNP (last 3 results) No results for input(s): PROBNP in the last 8760 hours. HbA1C: No results for input(s): HGBA1C in the last 72 hours. CBG: No results for input(s): GLUCAP in the last 168 hours. Lipid Profile: No results for input(s): CHOL, HDL, LDLCALC, TRIG, CHOLHDL, LDLDIRECT in the last 72 hours. Thyroid Function Tests: No results  for input(s): TSH, T4TOTAL, FREET4, T3FREE, THYROIDAB in the last 72 hours. Anemia Panel: No results for input(s): VITAMINB12, FOLATE, FERRITIN, TIBC, IRON, RETICCTPCT in the last 72 hours. Urine analysis:    Component Value Date/Time   COLORURINE YELLOW 11/21/2017 1018   APPEARANCEUR TURBID (A) 11/21/2017 1018   APPEARANCEUR Hazy 11/26/2012 1642   LABSPEC 1.018 11/21/2017 1018   LABSPEC 1.024 11/26/2012 1642   PHURINE 8.0 11/21/2017 1018   GLUCOSEU NEGATIVE 11/21/2017 1018   GLUCOSEU Negative 11/26/2012 1642   HGBUR SMALL (A) 11/21/2017 1018   BILIRUBINUR NEGATIVE 11/21/2017 1018   BILIRUBINUR Negative 11/26/2012 1642   KETONESUR 20 (A) 11/21/2017 1018   PROTEINUR 30 (A) 11/21/2017 1018   UROBILINOGEN negative 10/10/2012 1633   UROBILINOGEN 1.0 05/08/2008 1210   NITRITE NEGATIVE 11/21/2017 1018   LEUKOCYTESUR LARGE (A) 11/21/2017 1018   LEUKOCYTESUR Negative 11/26/2012 1642   Sepsis Labs: !!!!!!!!!!!!!!!!!!!!!!!!!!!!!!!!!!!!!!!!!!!! (procalcitonin:4,lacticidven:4) )No results found for this or any previous visit (from the past 240 hour(s)).   Radiological Exams on Admission: Ct Head Wo Contrast  Result Date:  11/21/2017 CLINICAL DATA:  Altered level of consciousness. Head injury after unwitnessed fall. EXAM: CT HEAD WITHOUT CONTRAST CT CERVICAL SPINE WITHOUT CONTRAST TECHNIQUE: Multidetector CT imaging of the head and cervical spine was performed following the standard protocol without intravenous contrast. Multiplanar CT image reconstructions of the cervical spine were also generated. COMPARISON:  CT scan of February 28, 2016. FINDINGS: CT HEAD FINDINGS Brain: Mild chronic ischemic white matter disease is noted. Minimal diffuse cortical atrophy is noted. Stable old lacunar infarction is noted in left basal ganglia. No mass effect or midline shift is noted. Ventricular size is within normal limits. There is no evidence of mass lesion, hemorrhage or acute infarction. Vascular: No hyperdense vessel or unexpected calcification. Skull: Normal. Negative for fracture or focal lesion. Sinuses/Orbits: No acute finding. Other: None. CT CERVICAL SPINE FINDINGS Alignment: Normal. Skull base and vertebrae: No acute fracture. No primary bone lesion or focal pathologic process. Soft tissues and spinal canal: No prevertebral fluid or swelling. No visible canal hematoma. Disc levels: Moderate degenerative disc disease is noted at C5-6 and C6-7 with anterior osteophyte formation. Upper chest: Negative. Other: None. IMPRESSION: Mild chronic ischemic white matter disease. Minimal diffuse cortical atrophy. No acute intracranial abnormality seen. Moderate multilevel degenerative disc disease. No acute abnormality seen in the cervical spine. Electronically Signed   By: Lupita Raider, M.D.   On: 11/21/2017 10:04   Ct Cervical Spine Wo Contrast  Result Date: 11/21/2017 CLINICAL DATA:  Altered level of consciousness. Head injury after unwitnessed fall. EXAM: CT HEAD WITHOUT CONTRAST CT CERVICAL SPINE WITHOUT CONTRAST TECHNIQUE: Multidetector CT imaging of the head and cervical spine was performed following the standard protocol without  intravenous contrast. Multiplanar CT image reconstructions of the cervical spine were also generated. COMPARISON:  CT scan of February 28, 2016. FINDINGS: CT HEAD FINDINGS Brain: Mild chronic ischemic white matter disease is noted. Minimal diffuse cortical atrophy is noted. Stable old lacunar infarction is noted in left basal ganglia. No mass effect or midline shift is noted. Ventricular size is within normal limits. There is no evidence of mass lesion, hemorrhage or acute infarction. Vascular: No hyperdense vessel or unexpected calcification. Skull: Normal. Negative for fracture or focal lesion. Sinuses/Orbits: No acute finding. Other: None. CT CERVICAL SPINE FINDINGS Alignment: Normal. Skull base and vertebrae: No acute fracture. No primary bone lesion or focal pathologic process. Soft tissues and spinal canal: No prevertebral fluid or swelling. No visible  canal hematoma. Disc levels: Moderate degenerative disc disease is noted at C5-6 and C6-7 with anterior osteophyte formation. Upper chest: Negative. Other: None. IMPRESSION: Mild chronic ischemic white matter disease. Minimal diffuse cortical atrophy. No acute intracranial abnormality seen. Moderate multilevel degenerative disc disease. No acute abnormality seen in the cervical spine. Electronically Signed   By: Lupita Raider, M.D.   On: 11/21/2017 10:04   Dg Chest Port 1 View  Result Date: 11/21/2017 CLINICAL DATA:  Weakness, altered mental status EXAM: PORTABLE CHEST 1 VIEW COMPARISON:  04/21/2014 FINDINGS: Heart and mediastinal contours are within normal limits. No focal opacities or effusions. No acute bony abnormality. IMPRESSION: No active disease. Electronically Signed   By: Charlett Nose M.D.   On: 11/21/2017 09:41    EKG: Independently reviewed. Normal sinus rhythm. Mild t-wave flattening in V2.  Assessment/Plan Principal Problem:   Altered mental status, unspecified Active Problems:   Tobacco abuse   Alcohol abuse   History of CVA  (cerebrovascular accident)   Acute lower UTI   Altered mental status Unknown etiology at present time.  Possibly related to urinary tract infection.  Patient also with alcohol abuse history.  CT head was unremarkable for acute etiology. Patient with history of recurrent UTIs. -Urine culture pending -Empirically treat UTI with ceftriaxone and fluconazole -Ammonia -Adjust pain regimen as listed below  Possible syncope Patient found down. Unwitnessed. Per discussion with family, patient has a history of falls secondary to alcohol intoxication. CT head unremarkable. History of stroke with right sided deficits at new baseline. Patient also with significant sedating medication regimen -Transthoracic Echocardiogram  Elevated lactic acid Patient appears dehydrated on limited exam. S/p 2L NS bolus -IV fluids -Recheck lactic acid; re-bolus if not improved  Ethanol abuse Per family member, patient has a significant history.  Unsure if patient is still drinking since she is been in the SNF. -CIWA (patient has a listed allergy to ativan with symptoms of hallucinations) -Ethanol level  History of stroke Right sided deficits. Patient not moving right side -Continue aspirin  Chronic anticoagulation Per report/review, patient is on Xarelto for circulation/prevent DVT (per vascular surgery note in February. SNF states secondary to PAD per their records). Only taking 2.5 mg once daily. No evidence of hemorrhage on CT head -Continue Xarelto  History of PAD Patient followed as an outpatient by vascular surgery. Per notes, unlikely PAD on their evaluation.  Chronic pain Patient on Baclofen, Oxycodone, gabapentin, Cymbalta, Zanaflex n as an outpatient. Per discussion with SNF, patient has Baclofen, gabapentin and oxycodone scheduled. -Continue Baclofen and gabapentin (reduced dose) -Continue oxycodone as needed -Continue Cymbalta  Tobacco abuse -Nicotine patch  DVT prophylaxis: Xarelto,  SCDs Code Status: DNR Family Communication: None at bedside. Daughter-in-law on telephone Disposition Plan: Anticipate discharge back to SNF when medically stable. Consults called: None Admission status: Inpatient, telemetry   Jacquelin Hawking, MD [facility] Pager [phone]  If 7PM-7AM, please contact night-coverage [email or web address] Password [facility]  11/21/2017, 11:49 AM

## 2017-11-21 NOTE — ED Notes (Signed)
ED Provider at bedside. 

## 2017-11-21 NOTE — ED Provider Notes (Signed)
[facility]-EMERGENCY DEPT Provider Note   CSN: [id number] Arrival date & time: 11/21/17  0102     History   Chief Complaint Chief Complaint  Patient presents with  . Fall  . Altered Mental Status    HPI [name] [name] is a 57 y.o. female.  Patient was sent from the nursing home for altered mental status.  She has a history of a large stroke affecting her right arm and right leg.  She is been more confused last couple days  The history is provided by the patient and the nursing home. No language interpreter was used.  Altered Mental Status   This is a new problem. The current episode started 2 days ago. Associated symptoms include confusion. Risk factors: Unknown. Her past medical history does not include seizures.    Past Medical History:  Diagnosis Date  . Anxiety   . Bipolar disorder (HCC)   . Chronic pain   . CVA (cerebral vascular accident) [facility]) Jul 22, 1999   right side  . Depression   . Gout   . Osteoporosis   . Pap smear for cervical cancer screening    5+ years ago normal  . Pulmonary embolism (HCC)    bilateral  . Tobacco abuse   . Vitamin D deficiency     Patient Active Problem List   Diagnosis Date Noted  . Female stress incontinence 10/10/2012  . Urinary frequency 10/10/2012  . Urinary tract infection, site not specified 09/26/2012    Past Surgical History:  Procedure Laterality Date  . APPENDECTOMY    . PARTIAL HIP ARTHROPLASTY Right October 2001  . TOTAL HIP ARTHROPLASTY Left June 2000     OB History   None      Home Medications    Prior to Admission medications   Medication Sig Start Date End Date Taking? Authorizing Provider  acetaminophen (TYLENOL) 325 MG tablet Take 650 mg by mouth every 6 (six) hours as needed for mild pain or fever.    Yes [provider]  allopurinol (ZYLOPRIM) 100 MG tablet Take 100 mg by mouth daily.   Yes [provider]  aspirin 81 MG tablet Take 81 mg by mouth  daily.   Yes [provider]  baclofen (LIORESAL) 20 MG tablet Take 20 mg by mouth every 6 (six) hours as needed (chronic pain).    Yes [provider]  benzonatate (TESSALON) 100 MG capsule Take 100 mg by mouth every 8 (eight) hours as needed for cough.   Yes [provider]  Calcium Carbonate-Vitamin D3 (CALCIUM 600-D) 600-400 MG-UNIT TABS Take 1 tablet by mouth 2 (two) times daily.   Yes [provider]  cetirizine (ZYRTEC) 10 MG tablet Take 10 mg by mouth daily.   Yes [provider]  cycloSPORINE (RESTASIS) 0.05 % ophthalmic emulsion Place 1 drop into both eyes 2 (two) times daily.    Yes [provider]  DULoxetine (CYMBALTA) 60 MG capsule Take 60 mg by mouth daily.   Yes [provider]  fluticasone (FLONASE) 50 MCG/ACT nasal spray Place 1 spray into both nostrils at bedtime.   Yes [provider]  gabapentin (NEURONTIN) 300 MG capsule Take 300 mg by mouth 3 (three) times daily.   Yes [provider]  Hypromellose (ARTIFICIAL TEARS) 0.4 % SOLN Place 2 drops into both eyes every 4 (four) hours as needed (dry eyes).   Yes [provider]  Melatonin 5 MG TABS Take 5 mg by  mouth at bedtime.   Yes [provider]  methenamine (MANDELAMINE) 1 g tablet Take 1,000 mg by mouth 2 (two) times daily.   Yes [provider]  metroNIDAZOLE (METROGEL) 1 % gel Apply 1 application topically daily.   Yes [provider]  nitrofurantoin, macrocrystal-monohydrate, (MACROBID) 100 MG capsule Take 100 mg by mouth at bedtime.   Yes [provider]  Oxycodone HCl 10 MG TABS Take 10 mg by mouth every 6 (six) hours as needed (pain).   Yes [provider]  rivaroxaban (XARELTO) 2.5 MG TABS tablet Take 2.5 mg by mouth every evening.   Yes [provider]  tiZANidine (ZANAFLEX) 4 MG tablet Take 4 mg by mouth every 8 (eight) hours as needed for muscle spasms.    Yes [provider]    Family History Family History  Problem Relation Age of Onset  . Stroke Mother   . Stroke Brother   . Hypertension Brother   . Heart murmur Brother     Social History Social History   Tobacco Use  . Smoking status: Current Some Day Smoker    Packs/day: 0.50    Types: Cigarettes  . Smokeless tobacco: Never Used  Substance Use Topics  . Alcohol use: Yes    Comment: Varies   . Drug use: Yes    Types: Marijuana     Allergies   Ativan [lorazepam]; Bactrim [sulfamethoxazole-trimethoprim]; and Paxil [paroxetine hcl]   Review of Systems Review of Systems  Unable to perform ROS: Mental status change  Psychiatric/Behavioral: Positive for confusion.     Physical Exam Updated Vital Signs BP (!) 115/59 (BP Location: Left Arm)   Pulse 86   Temp 98.9 F (37.2 C) (Rectal)   Resp (!) 22   SpO2 100%   Physical Exam  Constitutional: She appears well-developed.  HENT:  Head: Normocephalic.  Eyes: Conjunctivae and EOM are normal. No scleral icterus.  Neck: Neck supple. No thyromegaly present.  Cardiovascular: Normal rate and regular rhythm. Exam reveals no gallop and no friction rub.  No murmur heard. Pulmonary/Chest: No stridor. She has no wheezes. She has no rales. She exhibits no tenderness.  Abdominal: She exhibits no distension. There is no tenderness. There is no rebound.  Musculoskeletal: She exhibits no edema.  Significant weakness to right arm and right leg which is from her old stroke.  Lymphadenopathy:    She has no cervical adenopathy.  Neurological: She is alert. She exhibits normal muscle tone. Coordination normal.  Patient oriented to person only  Skin: No rash noted. No erythema.     ED Treatments / Results  Labs (all labs ordered are listed, but only abnormal results are displayed) Labs Reviewed  COMPREHENSIVE METABOLIC PANEL - Abnormal; Notable for the following components:      Result Value   Glucose, Bld 113 (*)    All other  components within normal limits  CBC WITH DIFFERENTIAL/PLATELET - Abnormal; Notable for the following components:   Hemoglobin 16.0 (*)    HCT 47.7 (*)    All other components within normal limits  URINALYSIS, ROUTINE W REFLEX MICROSCOPIC - Abnormal; Notable for the following components:   APPearance TURBID (*)    Hgb urine dipstick SMALL (*)    Ketones, ur 20 (*)    Protein, ur 30 (*)    Leukocytes, UA LARGE (*)    WBC, UA >50 (*)    Bacteria, UA RARE (*)    All other components within normal limits  I-STAT CG4 LACTIC ACID, ED - Abnormal; Notable for the following components:   Lactic Acid, Venous 2.28 (*)    All other components within normal limits  I-STAT CG4 LACTIC ACID, ED - Abnormal; Notable for the following components:   Lactic Acid, Venous 2.25 (*)    All other components within normal limits  URINE CULTURE  I-STAT TROPONIN, ED    EKG EKG Interpretation  Date/Time:  Wednesday Nov 21 2017 08:48:41 EDT Ventricular Rate:  77 PR Interval:    QRS Duration: 88 QT Interval:  393 QTC Calculation: 445 R Axis:   82 Text Interpretation:  Sinus rhythm Low voltage, precordial leads Nonspecific T abnormalities, lateral leads Confirmed by Bethann Berkshire [phone]) on 11/21/2017 11:27:16 AM   Radiology Ct Head Wo Contrast  Result Date: 11/21/2017 CLINICAL DATA:  Altered level of consciousness. Head injury after unwitnessed fall. EXAM: CT HEAD WITHOUT CONTRAST CT CERVICAL SPINE WITHOUT CONTRAST TECHNIQUE: Multidetector CT imaging of the head and cervical spine was performed following the standard protocol without intravenous contrast. Multiplanar CT image reconstructions of the cervical spine were also generated. COMPARISON:  CT scan of February 28, 2016. FINDINGS: CT HEAD FINDINGS Brain: Mild chronic ischemic white matter disease is noted. Minimal diffuse cortical atrophy is noted. Stable old lacunar infarction is noted in left basal ganglia. No mass effect or midline shift is noted.  Ventricular size is within normal limits. There is no evidence of mass lesion, hemorrhage or acute infarction. Vascular: No hyperdense vessel or unexpected calcification. Skull: Normal. Negative for fracture or focal lesion. Sinuses/Orbits: No acute finding. Other: None. CT CERVICAL SPINE FINDINGS Alignment: Normal. Skull base and vertebrae: No acute fracture. No primary bone lesion or focal pathologic process. Soft tissues and spinal canal: No prevertebral fluid or swelling. No visible canal hematoma. Disc levels: Moderate degenerative disc disease is noted at C5-6 and C6-7 with anterior osteophyte formation. Upper chest: Negative. Other: None. IMPRESSION: Mild chronic ischemic white matter disease. Minimal diffuse cortical atrophy. No acute intracranial abnormality seen. Moderate multilevel degenerative disc disease. No acute abnormality seen in the cervical spine. Electronically Signed   By: Lupita Raider, M.D.   On: 11/21/2017 10:04   Ct Cervical Spine Wo Contrast  Result Date: 11/21/2017 CLINICAL DATA:  Altered level of consciousness. Head injury after unwitnessed fall. EXAM: CT HEAD WITHOUT CONTRAST CT CERVICAL SPINE WITHOUT CONTRAST TECHNIQUE: Multidetector CT imaging of the head and cervical spine was performed following the standard protocol without intravenous contrast. Multiplanar CT image reconstructions of the cervical spine were also generated. COMPARISON:  CT scan of February 28, 2016. FINDINGS: CT HEAD FINDINGS Brain: Mild chronic ischemic white matter disease is noted. Minimal diffuse cortical atrophy is noted. Stable old lacunar infarction is noted in left basal ganglia. No mass effect or midline shift is noted. Ventricular size is within normal limits. There is no evidence of mass lesion, hemorrhage or acute infarction. Vascular: No hyperdense vessel or unexpected calcification. Skull: Normal. Negative for fracture or focal lesion. Sinuses/Orbits: No acute finding. Other: None. CT CERVICAL  SPINE FINDINGS Alignment: Normal. Skull base and vertebrae: No acute fracture. No primary bone lesion or focal pathologic process. Soft tissues and spinal canal: No prevertebral fluid or swelling. No visible canal hematoma. Disc levels: Moderate degenerative disc disease is noted at C5-6 and C6-7 with anterior osteophyte formation. Upper chest: Negative. Other: None. IMPRESSION: Mild chronic ischemic white matter disease. Minimal diffuse cortical atrophy. No acute intracranial abnormality seen. Moderate multilevel degenerative disc disease. No acute abnormality  seen in the cervical spine. Electronically Signed   By: Lupita Raider, M.D.   On: 11/21/2017 10:04   Dg Chest Port 1 View  Result Date: 11/21/2017 CLINICAL DATA:  Weakness, altered mental status EXAM: PORTABLE CHEST 1 VIEW COMPARISON:  04/21/2014 FINDINGS: Heart and mediastinal contours are within normal limits. No focal opacities or effusions. No acute bony abnormality. IMPRESSION: No active disease. Electronically Signed   By: Charlett Nose M.D.   On: 11/21/2017 09:41    Procedures Procedures (including critical care time)  Medications Ordered in ED Medications  sodium chloride 0.9 % bolus 2,000 mL (0 mLs Intravenous Stopped 11/21/17 1120)     Initial Impression / Assessment and Plan / ED Course  I have reviewed the triage vital signs and the nursing notes.  Pertinent labs & imaging results that were available during my care of the patient were reviewed by me and considered in my medical decision making (see chart for details).     CT scan head and cervical spine unremarkable.  Chest x-ray unremarkable chemistries and CBC show no significant abnormality.  Cath urine does show urinary tract infection.  I suspect her altered mental status related to UTI and she will be admitted for IV antibiotics  Final Clinical Impressions(s) / ED Diagnoses   Final diagnoses:  Acute cystitis with hematuria    ED Discharge Orders    None        Bethann Berkshire, MD 11/21/17 1135

## 2017-11-21 NOTE — ED Notes (Signed)
Patient transported to CT 

## 2017-11-22 ENCOUNTER — Inpatient Hospital Stay (HOSPITAL_COMMUNITY): Payer: Medicaid Other

## 2017-11-22 LAB — BASIC METABOLIC PANEL
ANION GAP: 10 (ref 5–15)
BUN: 11 mg/dL (ref 6–20)
CO2: 17 mmol/L — ABNORMAL LOW (ref 22–32)
CREATININE: 0.55 mg/dL (ref 0.44–1.00)
Calcium: 8.2 mg/dL — ABNORMAL LOW (ref 8.9–10.3)
Chloride: 108 mmol/L (ref 101–111)
Glucose, Bld: 73 mg/dL (ref 65–99)
Potassium: 4.1 mmol/L (ref 3.5–5.1)
SODIUM: 135 mmol/L (ref 135–145)

## 2017-11-22 LAB — CBC
HCT: 39.5 % (ref 36.0–46.0)
HEMOGLOBIN: 12.9 g/dL (ref 12.0–15.0)
MCH: 30.6 pg (ref 26.0–34.0)
MCHC: 32.7 g/dL (ref 30.0–36.0)
MCV: 93.6 fL (ref 78.0–100.0)
PLATELETS: 211 10*3/uL (ref 150–400)
RBC: 4.22 MIL/uL (ref 3.87–5.11)
RDW: 13.7 % (ref 11.5–15.5)
WBC: 6.8 10*3/uL (ref 4.0–10.5)

## 2017-11-22 LAB — RAPID URINE DRUG SCREEN, HOSP PERFORMED
AMPHETAMINES: NOT DETECTED
BARBITURATES: NOT DETECTED
Benzodiazepines: NOT DETECTED
Cocaine: NOT DETECTED
OPIATES: POSITIVE — AB
Tetrahydrocannabinol: NOT DETECTED

## 2017-11-22 MED ORDER — SODIUM CHLORIDE 0.9 % IV SOLN
INTRAVENOUS | Status: AC
  Administered 2017-11-22 (×2): via INTRAVENOUS

## 2017-11-22 MED ORDER — RIVAROXABAN 2.5 MG PO TABS
2.5000 mg | ORAL_TABLET | Freq: Every evening | ORAL | Status: DC
  Administered 2017-11-23 – 2017-11-26 (×4): 2.5 mg via ORAL
  Filled 2017-11-22 (×5): qty 1

## 2017-11-22 MED ORDER — BACLOFEN 10 MG PO TABS
10.0000 mg | ORAL_TABLET | Freq: Four times a day (QID) | ORAL | Status: DC | PRN
  Administered 2017-11-23 – 2017-11-26 (×10): 10 mg via ORAL
  Filled 2017-11-22 (×10): qty 1

## 2017-11-22 MED ORDER — THIAMINE HCL 100 MG/ML IJ SOLN
500.0000 mg | Freq: Three times a day (TID) | INTRAVENOUS | Status: AC
  Administered 2017-11-22 – 2017-11-24 (×6): 500 mg via INTRAVENOUS
  Filled 2017-11-22 (×6): qty 5

## 2017-11-22 MED ORDER — THIAMINE HCL 100 MG/ML IJ SOLN
250.0000 mg | Freq: Every day | INTRAMUSCULAR | Status: DC
  Administered 2017-11-24 – 2017-11-25 (×2): 250 mg via INTRAVENOUS
  Filled 2017-11-22 (×4): qty 4

## 2017-11-22 NOTE — Progress Notes (Signed)
CSW consulted for patient admitted from facility. Per chart review, patient arrived from [facility]. Patient currently disoriented and unable to participate in assessment. CSW contacted patient's emergency contact Consuella Lose [phone]) to complete assessment, no answer nor option to leave a voicemail. CSW sent a text message requesting return phone call. CSW will continue to try to reach patient's daughter to discuss discharge planning.  Celso Sickle, [place] Clinical Social Worker [facility] Cell#: [phone]

## 2017-11-22 NOTE — CV Procedure (Signed)
2D echo attempted but patient would not let me do test. Patient raised her arm at me.

## 2017-11-22 NOTE — Evaluation (Signed)
Physical Therapy Evaluation Patient Details Name: [name] [name] MRN: [id number] DOB: Sep 09, 1960 Today's Date: 11/22/2017   History of Present Illness  57 y.o. female with medical history significant of recurrent urinary tract infections, CVA, alcohol abuse, tobacco abuse.  Patient presented from SNF secondary to altered mental status  Clinical Impression  Pt admitted with above diagnosis. Pt currently with functional limitations due to the deficits listed below (see PT Problem List).  Pt will benefit from skilled PT to increase their independence and safety with mobility to allow discharge to the venue listed below.  Pt willing to talk with PT and agreeable to assist with changing soiled linen however then not agreeable upon initiating.  Pt nonsensical and reports seeing "blackness" and refers to other persons in room (none present).  Uncertain of baseline, however CSW reports that SNF reports pt was using w/c and cognition not at baseline.  Will defer PT needs back to SNF however will continue to attempt to assist pt with mobility as pt allows.     Follow Up Recommendations SNF(rehab per SNF)    Equipment Recommendations  None recommended by PT    Recommendations for Other Services       Precautions / Restrictions Precautions Precautions: Fall      Mobility  Bed Mobility Overal bed mobility: Needs Assistance Bed Mobility: Rolling Rolling: Total assist         General bed mobility comments: pt attempting to assist with reaching with L UE when guided and willing to assist, pt agreeable and then not agreeable, NT assisted with changing linen as bed was soaked with urine  Transfers                    Ambulation/Gait                Stairs            Wheelchair Mobility    Modified Rankin (Stroke Patients Only)       Balance                                             Pertinent Vitals/Pain Pain Assessment: No/denies pain     Home Living Family/patient expects to be discharged to:: Skilled nursing facility                      Prior Function Level of Independence: Needs assistance   Gait / Transfers Assistance Needed: using w/c  ADL's / Homemaking Assistance Needed: pt reports assist for bathing and dressing  Comments: uncertain of accuracy     Hand Dominance        Extremity/Trunk Assessment   Upper Extremity Assessment Upper Extremity Assessment: RUE deficits/detail(pt reaching and pointing at objects with L UE) RUE Deficits / Details: R UE appears with flexion contracture    Lower Extremity Assessment Lower Extremity Assessment: Generalized weakness;LLE deficits/detail;Difficult to assess due to impaired cognition LLE Deficits / Details: left LE appears with increased flexion at rest       Communication   Communication: No difficulties  Cognition Arousal/Alertness: Awake/alert   Overall Cognitive Status: Impaired/Different from baseline                                 General Comments: pt not orientated, stated year as  1999, attempted to reorient however pt mostly focused on other "people" in the room or on "the darkness"      General Comments      Exercises     Assessment/Plan    PT Assessment Patient needs continued PT services  PT Problem List Decreased strength;Decreased mobility;Decreased activity tolerance;Decreased balance;Decreased cognition       PT Treatment Interventions DME instruction;Therapeutic activities;Therapeutic exercise;Patient/family education;Balance training;Wheelchair mobility training;Functional mobility training    PT Goals (Current goals can be found in the Care Plan section)  Acute Rehab PT Goals PT Goal Formulation: Patient unable to participate in goal setting Time For Goal Achievement: 11/29/17 Potential to Achieve Goals: Fair    Frequency Min 2X/week   Barriers to discharge        Co-evaluation                AM-PAC PT "6 Clicks" Daily Activity  Outcome Measure Difficulty turning over in bed (including adjusting bedclothes, sheets and blankets)?: Unable Difficulty moving from lying on back to sitting on the side of the bed? : Unable Difficulty sitting down on and standing up from a chair with arms (e.g., wheelchair, bedside commode, etc,.)?: Unable Help needed moving to and from a bed to chair (including a wheelchair)?: Total Help needed walking in hospital room?: Total Help needed climbing 3-5 steps with a railing? : Total 6 Click Score: 6    End of Session   Activity Tolerance: Other (comment)(limited by cognition) Patient left: with call bell/phone within reach;in bed;with bed alarm set Nurse Communication: Mobility status PT Visit Diagnosis: Other abnormalities of gait and mobility (R26.89)    Time: 2956-2130 PT Time Calculation (min) (ACUTE ONLY): 33 min   Charges:   PT Evaluation $PT Eval Low Complexity: 1 Low PT Treatments $Therapeutic Activity: 8-22 mins   PT G Codes:        Zenovia Jarred, PT, DPT 11/22/2017 Pager: [phone]   Maida Sale E 11/22/2017, 1:07 PM

## 2017-11-22 NOTE — Clinical Social Work Note (Signed)
Clinical Social Work Assessment  Patient Details  Name: [name] [name] MRN: [id number] Date of Birth: [birth year]  Date of referral:  11/22/17               Reason for consult:  Discharge Planning                Permission sought to share information with:    Permission granted to share information::     Name::        Agency::     Relationship::     Contact Information:     Housing/Transportation Living arrangements for the past 2 months:  Skilled Nursing Facility([facility]) Source of Information:  Other (Comment Required)(sister in law: [name] [name] [phone])) Patient Interpreter Needed:  None Criminal Activity/Legal Involvement Pertinent to Current Situation/Hospitalization:  No - Comment as needed Significant Relationships:  Other Family Members Lives with:  Facility Resident Do you feel safe going back to the place where you live?    Need for family participation in patient care:  Yes (Comment)  Care giving concerns:  Patient from [facility]. Patient is a [facility] term care resident. PT recommended patient return to SNF.   Social Worker assessment / plan:  CSW spoke with patient's sister in law [name] [name] listed under patient's emergency contacts. Patient currently disoriented x 4 and unable to participate in assessment. CSW and patient's sister in law discussed patient's discharge planning. Patient's sister in law reported that patient has been at SNF for the past 5 months and verbalized plan for patient to discharge back to [facility] for [name] [name] term care.    CSW will complete patient's FL2 and follow up with [facility] about patient's return.   CSW will continue to follow and assist with discharge planning.  Employment status:  Disabled (Comment on whether or not currently receiving Disability) Insurance information:  Medicaid In  [place] PT Recommendations:  Skilled Nursing Facility Information / Referral to community resources:  (Patient is a [facility] term care resident at [facility])  Patient/Family's Response to care:  Patient's sister appreciative of CSW assistance with discharge planning.  Patient/Family's Understanding of and Emotional Response to Diagnosis, Current Treatment, and Prognosis:  Patient currently disoriented and unable to participate in assessment. Patient's sister in law involved in patient's care and requested update about patient's current condition, CSW agreed to notify patient's RN. Patient's sister in law reported that patient has a hx of stroke and alcoholism and has been in nursing homes for about 5 years. Patient's sister in law reported that patient is "sharp as a tack" and that this is not her baseline. CSW notified patient's RN to update patient's sister in law about patient's medical condition.   Emotional Assessment Appearance:    Attitude/Demeanor/Rapport:  Unable to Assess Affect (typically observed):  Unable to Assess Orientation:  (Patient currently disoriented x 4) Alcohol / Substance use:  Not Applicable Psych involvement (Current and /or in the community):  No (Comment)  Discharge Needs  Concerns to be addressed:  Care Coordination Readmission within the last 30 days:  No Current discharge risk:  Cognitively Impaired, Physical Impairment Barriers to Discharge:  Continued Medical Work up   [employer], LCSW 11/22/2017, 3:05 PM

## 2017-11-22 NOTE — Progress Notes (Addendum)
PROGRESS NOTE  [name] [name] UJW:[id number] DOB: 02/11/1961 DOA: 11/21/2017 PCP: Nash Shearer, MD  HPI/Recap of past 24 hours: [name] [name] is a 57 year old female with medical history significant for recurrent UTIs, CVA, alcohol and tobacco abuse, presented from SNF secondary to altered mental status.  Patient was found down in her room, was unable to provide any history.  Per discussion with nurse at SNF, patient was her normal self the day prior to her visits in the ER.  In the ED, lactic acid was noted to be 2.28, CT head and cervical spine significant for chronic ischemia with multilevel degenerative disc disease.  Patient admitted for further management.  Today, patient has been refusing labs, medications, procedures/imaging, noted to be talking to herself, refusing to eat, repeating words over and over again.  No significant improvement since admission.  Unable to perform ROS.  Assessment/Plan: Principal Problem:   Altered mental status, unspecified Active Problems:   Tobacco abuse   Alcohol abuse   History of CVA (cerebrovascular accident)   Acute lower UTI  Acute metabolic encephalopathy Unknown etiology, ??  UTI, ?? Wernicke Korsakoff's Vs meds SNF reported she is not at her baseline UDS only positive for benzodiazepines CT head unremarkable for any acute etiology, does show chronic ischemic changes EEG and MRI ordered, pending Will start high dose thiamine for possible wernicke (given hx of heavy alcohol use)-500 mg 3 times daily for 2 days, and to 50 mg once daily for 5 days. May consider neurology/psych consult Monitor closely  ??Possible syncope Patient found down, unwitnessed History of falls from alcohol intoxication, history of CVAs Echo was attempted, but patient would not allow tech Continue telemetry monitoring, close observation  UTI UA showed large leukocytes, greater than 50 WBC, budding yeast UC pending Continue empiric azithromycin,  fluconazole  Elevated lactic acid Likely due to dehydration Patient refusing labs, no repeat done  History of CVA Has residual right-sided deficits Continue aspirin, currently refusing  Alcohol abuse Unlikely to be drinking as patient has been in SNF CIWA protocol, although allergic to Ativan  Chronic anticoagulation Continue Xarelto 2.5 mg once daily for ??PAD, history of CVA  History of chronic pain syndrome Continue home baclofen, gabapentin, oxycodone as needed, Cymbalta Held Zanaflex  Tobacco abuse Nicotine patch   Code Status: DNR  Family Communication: None at bedside  Disposition Plan: Back to SNF once stable   Consultants:  None  Procedures:  None  Antimicrobials:  Ceftriaxone  Fluconazole  DVT prophylaxis: Low-dose Xarelto, SCDs   Objective: Vitals:   11/21/17 1242 11/21/17 1322 11/21/17 1355 11/22/17 0514  BP: 121/63 114/74 114/74 104/64  Pulse: 68 65 65 (!) 59  Resp: Temp:   98.9 F (37.2 C) 97.6 F (36.4 C)  TempSrc:   Oral Oral  SpO2: 99%   99%  Weight:   61.2 kg (134 lb 14.7 oz)     Intake/Output Summary (Last 24 hours) at 11/22/2017 1449 Last data filed at 11/22/2017 5621 Gross per 24 hour  Intake 1310 ml  Output -  Net 1310 ml   Filed Weights   11/21/17 1355  Weight: 61.2 kg (134 lb 14.7 oz)    Exam:   General: Noncompliant, disruptive, agitated  Cardiovascular: S1, S2 present  Respiratory: CTA B  Abdomen: Soft, nontender, nondistended, bowel sounds present  Musculoskeletal: Lower extremities looked contracted  Skin: Normal  Psychiatry: Unable to assess, agitated, non-compliant  Neuro: Residual right-sided weakness, no other focal neurologic deficit noted  Data Reviewed: CBC: Recent Labs  Lab 11/21/17 0846 11/22/17 1121  WBC 7.9 6.8  NEUTROABS 5.4  --   HGB 16.0* 12.9  HCT 47.7* 39.5  MCV 94.5 93.6  PLT 258 211   Basic Metabolic Panel: Recent Labs  Lab 11/21/17 0846  11/22/17 1121  NA 143 135  K 4.8 4.1  CL 101 108  CO2 30 17*  GLUCOSE 113* 73  BUN 14 11  CREATININE 0.56 0.55  CALCIUM 9.8 8.2*   GFR: CrCl cannot be calculated (Unknown ideal weight.). Liver Function Tests: Recent Labs  Lab 11/21/17 0846  AST 23  ALT 18  ALKPHOS 88  BILITOT 0.8  PROT 8.1  ALBUMIN 4.1   No results for input(s): LIPASE, AMYLASE in the last 168 hours. No results for input(s): AMMONIA in the last 168 hours. Coagulation Profile: No results for input(s): INR, PROTIME in the last 168 hours. Cardiac Enzymes: No results for input(s): CKTOTAL, CKMB, CKMBINDEX, TROPONINI in the last 168 hours. BNP (last 3 results) No results for input(s): PROBNP in the last 8760 hours. HbA1C: No results for input(s): HGBA1C in the last 72 hours. CBG: No results for input(s): GLUCAP in the last 168 hours. Lipid Profile: No results for input(s): CHOL, HDL, LDLCALC, TRIG, CHOLHDL, LDLDIRECT in the last 72 hours. Thyroid Function Tests: No results for input(s): TSH, T4TOTAL, FREET4, T3FREE, THYROIDAB in the last 72 hours. Anemia Panel: No results for input(s): VITAMINB12, FOLATE, FERRITIN, TIBC, IRON, RETICCTPCT in the last 72 hours. Urine analysis:    Component Value Date/Time   COLORURINE YELLOW 11/21/2017 1018   APPEARANCEUR TURBID (A) 11/21/2017 1018   APPEARANCEUR Hazy 11/26/2012 1642   LABSPEC 1.018 11/21/2017 1018   LABSPEC 1.024 11/26/2012 1642   PHURINE 8.0 11/21/2017 1018   GLUCOSEU NEGATIVE 11/21/2017 1018   GLUCOSEU Negative 11/26/2012 1642   HGBUR SMALL (A) 11/21/2017 1018   BILIRUBINUR NEGATIVE 11/21/2017 1018   BILIRUBINUR Negative 11/26/2012 1642   KETONESUR 20 (A) 11/21/2017 1018   PROTEINUR 30 (A) 11/21/2017 1018   UROBILINOGEN negative 10/10/2012 1633   UROBILINOGEN 1.0 05/08/2008 1210   NITRITE NEGATIVE 11/21/2017 1018   LEUKOCYTESUR LARGE (A) 11/21/2017 1018   LEUKOCYTESUR Negative 11/26/2012 1642   Sepsis  Labs: (procalcitonin:4,lacticidven:4)  ) Recent Results (from the past 240 hour(s))  Urine culture     Status: None (Preliminary result)   Collection Time: 11/21/17 10:18 AM  Result Value Ref Range Status   Specimen Description   Final    URINE, CLEAN CATCH Performed at [facility], [address], [place], [place] [address]    Special Requests   Final    NONE Performed at [facility], [address], [place], [place] [address]    Culture   Final    CULTURE REINCUBATED FOR BETTER GROWTH Performed at [facility], [address], [place], [place] [address]    Report Status PENDING  Incomplete      Studies: No results found.  Scheduled Meds: . allopurinol  100 mg Oral Daily  . aspirin EC  81 mg Oral Daily  . DULoxetine  60 mg Oral Daily  . fluconazole  200 mg Oral Daily  . fluticasone  1 spray Each Nare QHS  . folic acid  1 mg Oral Daily  . gabapentin  100 mg Oral TID  . loratadine  10 mg Oral Daily  . multivitamin with minerals  1 tablet Oral Daily  . nicotine  7 mg Transdermal  Daily  . rivaroxaban  2.5 mg Oral QPM  . thiamine  100 mg Oral Daily   Or  . thiamine  100 mg Intravenous Daily    Continuous Infusions: . sodium chloride 100 mL/hr at 11/22/17 0954  . cefTRIAXone (ROCEPHIN)  IV 1 g (11/22/17 1439)     LOS: 1 day     [name], MD [facility]   If 7PM-7AM, please contact night-coverage [email or web address] Password [email or web address] 11/22/2017, 2:49 PM

## 2017-11-23 ENCOUNTER — Inpatient Hospital Stay (HOSPITAL_COMMUNITY): Payer: Medicaid Other

## 2017-11-23 ENCOUNTER — Inpatient Hospital Stay (HOSPITAL_COMMUNITY)
Admit: 2017-11-23 | Discharge: 2017-11-23 | Disposition: A | Discharge status: Home / Self Care | Payer: Medicaid Other | Attending: Internal Medicine | Admitting: Internal Medicine

## 2017-11-23 DIAGNOSIS — G9340 Encephalopathy, unspecified: Secondary | ICD-10-CM

## 2017-11-23 DIAGNOSIS — R4182 Altered mental status, unspecified: Secondary | ICD-10-CM

## 2017-11-23 LAB — GLUCOSE, CAPILLARY: Glucose-Capillary: 134 mg/dL — ABNORMAL HIGH (ref 65–99)

## 2017-11-23 MED ORDER — DEXTROSE-NACL 5-0.45 % IV SOLN
INTRAVENOUS | Status: DC
  Administered 2017-11-23 – 2017-11-24 (×2): via INTRAVENOUS

## 2017-11-23 MED ORDER — HALOPERIDOL LACTATE 5 MG/ML IJ SOLN
2.5000 mg | Freq: Once | INTRAMUSCULAR | Status: AC
  Administered 2017-11-23: 2.5 mg via INTRAVENOUS
  Filled 2017-11-23: qty 1

## 2017-11-23 MED ORDER — SENNOSIDES-DOCUSATE SODIUM 8.6-50 MG PO TABS
1.0000 | ORAL_TABLET | Freq: Once | ORAL | Status: AC
  Administered 2017-11-23: 1 via ORAL
  Filled 2017-11-23: qty 1

## 2017-11-23 NOTE — Progress Notes (Signed)
PROGRESS NOTE  [name] [name]:[id number] DOB: 1960-12-01 DOA: 11/21/2017 PCP: Nash Shearer, MD  HPI/Recap of past 24 hours: [name] [name] is a 57 year old female with medical history significant for recurrent UTIs, CVA, alcohol and tobacco abuse, presented from SNF secondary to altered mental status.  Patient was found down in her room, was unable to provide any history.  Per discussion with nurse at SNF, patient was her normal self the day prior to her visits in the ER.  In the ED, lactic acid was noted to be 2.28, CT head and cervical spine significant for chronic ischemia with multilevel degenerative disc disease.  Patient admitted for further management.  Today, patient noted to be less agitated, but still refusing labs, procedures/imaging, eat little breakfast. Little improvement since admission.  Unable to perform ROS.  Assessment/Plan: Principal Problem:   Altered mental status, unspecified Active Problems:   Tobacco abuse   Alcohol abuse   History of CVA (cerebrovascular accident)   Acute lower UTI  Acute metabolic encephalopathy Unknown etiology, ??  UTI, ?? Wernicke Korsakoff's Vs meds SNF/family members reported she is not at her baseline UDS only positive for benzodiazepines CT head unremarkable for any acute etiology, does show chronic ischemic changes EEG consistent with a mild generalized encephalopathy MRI ordered, pt unable to cooperate even with a dose of haldol Continue high dose thiamine for possible wernicke (given hx of heavy alcohol use)-500 mg 3 times daily for 2 days, and to 50 mg once daily for 5 days. May consider neurology/psych consult Monitor closely  ??Possible syncope Patient found down, unwitnessed History of falls from alcohol intoxication, history of CVAs Echo was attempted, but patient is not cooperating Continue telemetry monitoring, close observation  UTI UA showed large leukocytes, greater than 50 WBC, budding yeast UC grew E.coli 20,000  colonies Continue empiric ceftriaxone, fluconazole  Elevated lactic acid Likely due to dehydration Patient refusing labs, no repeat done  History of CVA Has residual right-sided deficits Continue aspirin, currently refusing  Alcohol abuse Unlikely to be drinking as patient has been in SNF CIWA protocol, although allergic to Ativan  Chronic anticoagulation Continue Xarelto 2.5 mg once daily for ??PAD, history of CVA  History of chronic pain syndrome Continue home baclofen, gabapentin, oxycodone as needed, Cymbalta Held Zanaflex  Tobacco abuse Nicotine patch   Code Status: DNR  Family Communication: None at bedside  Disposition Plan: Back to SNF once stable   Consultants:  None  Procedures:  EEG  Antimicrobials:  Ceftriaxone  Fluconazole  DVT prophylaxis: Low-dose Xarelto, SCDs   Objective: Vitals:   11/21/17 1242 11/21/17 1322 11/21/17 1355 11/22/17 0514  BP: 121/63 114/74 114/74 104/64  Pulse: 68 65 65 (!) 59  Resp: 19 17 17 12   Temp:   98.9 F (37.2 C) 97.6 F (36.4 C)  TempSrc:   Oral Oral  SpO2: 99%   99%  Weight:   61.2 kg (134 lb 14.7 oz)     Intake/Output Summary (Last 24 hours) at 11/23/2017 2133 Last data filed at 11/23/2017 1924 Gross per 24 hour  Intake 1813.75 ml  Output -  Net 1813.75 ml   Filed Weights   11/21/17 1355  Weight: 61.2 kg (134 lb 14.7 oz)    Exam:   General: Noncompliant, NAD  Cardiovascular: S1, S2 present  Respiratory: CTA B  Abdomen: Soft, nontender, nondistended, bowel sounds present  Musculoskeletal: Lower extremities looked contracted  Skin: Normal  Psychiatry: Unable to assess, non-compliant  Neuro: Residual right-sided weakness, no other  focal neurologic deficit noted   Data Reviewed: CBC: Recent Labs  Lab 11/21/17 0846 11/22/17 1121  WBC 7.9 6.8  NEUTROABS 5.4  --   HGB 16.0* 12.9  HCT 47.7* 39.5  MCV 94.5 93.6  PLT 258 211   Basic Metabolic Panel: Recent Labs  Lab  11/21/17 0846 11/22/17 1121  NA 143 135  K 4.8 4.1  CL 101 108  CO2 30 17*  GLUCOSE 113* 73  BUN 14 11  CREATININE 0.56 0.55  CALCIUM 9.8 8.2*   GFR: CrCl cannot be calculated (Unknown ideal weight.). Liver Function Tests: Recent Labs  Lab 11/21/17 0846  AST 23  ALT 18  ALKPHOS 88  BILITOT 0.8  PROT 8.1  ALBUMIN 4.1   No results for input(s): LIPASE, AMYLASE in the last 168 hours. No results for input(s): AMMONIA in the last 168 hours. Coagulation Profile: No results for input(s): INR, PROTIME in the last 168 hours. Cardiac Enzymes: No results for input(s): CKTOTAL, CKMB, CKMBINDEX, TROPONINI in the last 168 hours. BNP (last 3 results) No results for input(s): PROBNP in the last 8760 hours. HbA1C: No results for input(s): HGBA1C in the last 72 hours. CBG: No results for input(s): GLUCAP in the last 168 hours. Lipid Profile: No results for input(s): CHOL, HDL, LDLCALC, TRIG, CHOLHDL, LDLDIRECT in the last 72 hours. Thyroid Function Tests: No results for input(s): TSH, T4TOTAL, FREET4, T3FREE, THYROIDAB in the last 72 hours. Anemia Panel: No results for input(s): VITAMINB12, FOLATE, FERRITIN, TIBC, IRON, RETICCTPCT in the last 72 hours. Urine analysis:    Component Value Date/Time   COLORURINE YELLOW 11/21/2017 1018   APPEARANCEUR TURBID (A) 11/21/2017 1018   APPEARANCEUR Hazy 11/26/2012 1642   LABSPEC 1.018 11/21/2017 1018   LABSPEC 1.024 11/26/2012 1642   PHURINE 8.0 11/21/2017 1018   GLUCOSEU NEGATIVE 11/21/2017 1018   GLUCOSEU Negative 11/26/2012 1642   HGBUR SMALL (A) 11/21/2017 1018   BILIRUBINUR NEGATIVE 11/21/2017 1018   BILIRUBINUR Negative 11/26/2012 1642   KETONESUR 20 (A) 11/21/2017 1018   PROTEINUR 30 (A) 11/21/2017 1018   UROBILINOGEN negative 10/10/2012 1633   UROBILINOGEN 1.0 05/08/2008 1210   NITRITE NEGATIVE 11/21/2017 1018   LEUKOCYTESUR LARGE (A) 11/21/2017 1018   LEUKOCYTESUR Negative 11/26/2012 1642   Sepsis  Labs: @LABRCNTIP (procalcitonin:4,lacticidven:4)  ) Recent Results (from the past 240 hour(s))  Urine culture     Status: Abnormal (Preliminary result)   Collection Time: 11/21/17 10:18 AM  Result Value Ref Range Status   Specimen Description   Final    URINE, CLEAN CATCH Performed at [facility], [address] [place], [place] [address]    Special Requests   Final    NONE Performed at [facility], [address] [place], [place] [address]    Culture (A)  Final    20,000 COLONIES/mL ESCHERICHIA COLI SUSCEPTIBILITIES TO FOLLOW Performed at [facility], [address] [place], [place] [address]    Report Status PENDING  Incomplete      Studies: No results found.  Scheduled Meds: . allopurinol  100 mg Oral Daily  . aspirin EC  81 mg Oral Daily  . DULoxetine  60 mg Oral Daily  . fluconazole  200 mg Oral Daily  . fluticasone  1 spray Each Nare QHS  . folic acid  1 mg Oral Daily  . gabapentin  100 mg Oral TID  . loratadine  10 mg Oral Daily  . multivitamin with minerals  1 tablet Oral  Daily  . nicotine  7 mg Transdermal Daily  . rivaroxaban  2.5 mg Oral QPM  . [START ON 11/24/2017] thiamine injection  250 mg Intravenous Daily    Continuous Infusions: . cefTRIAXone (ROCEPHIN)  IV Stopped (11/23/17 1711)  . dextrose 5 % and 0.45% NaCl 75 mL/hr at 11/23/17 1145  . thiamine injection 500 mg (11/23/17 1924)     LOS: 2 days     [name], MD [facility]   If 7PM-7AM, please contact night-coverage [email or web address] Password [email or web address] 11/23/2017, 9:33 PM

## 2017-11-23 NOTE — Procedures (Signed)
History: 57 year old female [id number] evaluated for encephalopathy.  Sedation: None  Technique: This is a 21 channel routine scalp EEG performed at the bedside with bipolar and monopolar montages arranged in accordance to the international 10/20 system of electrode placement. One channel was dedicated to EKG recording.    Background: There is extensive muscle artifact throughout the recording.  There is a posterior dominant rhythm seen at times of 9 Hz.  In addition there is mild generalized irregular delta range activity.   Photic stimulation: Physiologic driving is not performed  EEG Abnormalities: 1) generalized irregular slow activity  Clinical Interpretation: This EEG is consistent with a mild generalized nonspecific dysfunction (encephalopathy).    There was no seizure or seizure predisposition recorded on this study. Please note that a normal EEG does not preclude the possibility of epilepsy.   Ritta SlotMcNeill Kirkpatrick, MD [facility] [phone]  If 7pm- 7am, please page neurology on call as listed in AMION.

## 2017-11-23 NOTE — Progress Notes (Signed)
Pt came down to MRI. Pt was premedicated prior to coming down. Pt is currently unable to withstand a mri scan. Unable to even take pt's lead off. Pt refused to be moved over.

## 2017-11-23 NOTE — Progress Notes (Signed)
EEG completed, results pending. 

## 2017-11-23 NOTE — NC FL2 (Signed)
[place] MEDICAID FL2 LEVEL OF CARE SCREENING TOOL     IDENTIFICATION  Patient Name: [name] [name] Birthdate: Jul 04, 1960 Sex: female Admission Date (Current Location): 11/21/2017  [facility]   Facility and Address:  [facility],  [address], [place] [address]      Provider Number: [id number]  Attending Physician Name and Address:  Briant CedarEzenduka, [name], MD  Relative Name and Phone Number:       Current Level of Care: Hospital Recommended Level of Care: Skilled Nursing Facility Prior Approval Number:    Date Approved/Denied:   PASRR Number: [id number] [id number]  Discharge Plan: SNF    Current Diagnoses: Patient Active Problem List   Diagnosis Date Noted  . Altered mental status, unspecified 11/21/2017  . Tobacco abuse 11/21/2017  . Alcohol abuse 11/21/2017  . History of CVA (cerebrovascular accident) 11/21/2017  . Acute lower UTI 11/21/2017  . Female stress incontinence 10/10/2012  . Urinary frequency 10/10/2012  . Urinary tract infection, site not specified 09/26/2012    Orientation RESPIRATION BLADDER Height & Weight     Self  Normal Incontinent Weight: 134 lb 14.7 oz (61.2 kg) Height:     BEHAVIORAL SYMPTOMS/MOOD NEUROLOGICAL BOWEL NUTRITION STATUS        Diet(see dc summary)  AMBULATORY STATUS COMMUNICATION OF NEEDS Skin   Total Care Verbally Normal                       Personal Care Assistance Level of Assistance  Bathing, Feeding, Dressing Bathing Assistance: Maximum assistance Feeding assistance: Limited assistance Dressing Assistance: Maximum assistance     Functional Limitations Info  Sight, Hearing, Speech Sight Info: Adequate Hearing Info: Adequate Speech Info: Adequate    SPECIAL CARE FACTORS FREQUENCY  PT (By licensed PT)     PT Frequency: min 2x/week              Contractures Contractures Info: Present    Additional Factors Info  Code Status, Allergies Code Status Info:  DNR Allergies Info: Ativan Lorazepam;Bactrim Sulfamethoxazole-trimethoprim;Paxil Paroxetine Hcl           Current Medications (11/23/2017):  This is the current hospital active medication list Current Facility-Administered Medications  Medication Dose Route Frequency Provider Last Rate Last Dose  . allopurinol (ZYLOPRIM) tablet 100 mg  100 mg Oral Daily Narda BondsNettey, Ralph A, MD      . aspirin EC tablet 81 mg  81 mg Oral Daily Narda BondsNettey, Ralph A, MD      . baclofen (LIORESAL) tablet 10 mg  10 mg Oral QID PRN Briant CedarEzenduka, [name], MD      . cefTRIAXone (ROCEPHIN) 1 g in sodium chloride 0.9 % 100 mL IVPB  1 g Intravenous Q24H Narda BondsNettey, Ralph A, MD   Stopped at 11/22/17 1509  . dextrose 5 %-0.45 % sodium chloride infusion   Intravenous Continuous Briant CedarEzenduka, [name], MD      . DULoxetine (CYMBALTA) DR capsule 60 mg  60 mg Oral Daily Narda BondsNettey, Ralph A, MD      . fluconazole (DIFLUCAN) tablet 200 mg  200 mg Oral Daily Narda BondsNettey, Ralph A, MD      . fluticasone (FLONASE) 50 MCG/ACT nasal spray 1 spray  1 spray Each Nare QHS Narda BondsNettey, Ralph A, MD      . folic acid (FOLVITE) tablet 1 mg  1 mg Oral Daily Narda BondsNettey, Ralph A, MD      . gabapentin (NEURONTIN) capsule 100 mg  100 mg  Oral TID Narda Bonds, MD   100 mg at 11/21/17 2016  . loratadine (CLARITIN) tablet 10 mg  10 mg Oral Daily Narda Bonds, MD      . LORazepam (ATIVAN) tablet 1 mg  1 mg Oral Q6H PRN Narda Bonds, MD       Or  . LORazepam (ATIVAN) injection 1 mg  1 mg Intravenous Q6H PRN Narda Bonds, MD      . multivitamin with minerals tablet 1 tablet  1 tablet Oral Daily Narda Bonds, MD      . nicotine (NICODERM CQ - dosed in mg/24 hr) patch 7 mg  7 mg Transdermal Daily Narda Bonds, MD      . oxyCODONE (Oxy IR/ROXICODONE) immediate release tablet 5-10 mg  5-10 mg Oral Q6H PRN Narda Bonds, MD      . polyvinyl alcohol (LIQUIFILM TEARS) 1.4 % ophthalmic solution 2 drop  2 drop Both Eyes Q4H PRN Narda Bonds, MD      . rivaroxaban  (XARELTO) tablet 2.5 mg  2.5 mg Oral QPM Cindi Carbon, RPH      . [START ON 11/24/2017] thiamine (B-1) injection 250 mg  250 mg Intravenous Daily [name], MD      . thiamine 500mg  in normal saline (50ml) IVPB  500 mg Intravenous TID [name], MD   Stopped at 11/22/17 2246     Discharge Medications: Please see discharge summary for a list of discharge medications.  Relevant Imaging Results:  Relevant Lab Results:   Additional Information SSN   [id number]  Antionette Poles, LCSW

## 2017-11-24 LAB — URINE CULTURE: Culture: 20000 — AB

## 2017-11-24 LAB — CBC WITH DIFFERENTIAL/PLATELET
Basophils Absolute: 0 10*3/uL (ref 0.0–0.1)
Basophils Relative: 0 %
Eosinophils Absolute: 0.2 10*3/uL (ref 0.0–0.7)
Eosinophils Relative: 3 %
HEMATOCRIT: 37.7 % (ref 36.0–46.0)
HEMOGLOBIN: 12.9 g/dL (ref 12.0–15.0)
LYMPHS ABS: 2.3 10*3/uL (ref 0.7–4.0)
LYMPHS PCT: 35 %
MCH: 30.5 pg (ref 26.0–34.0)
MCHC: 34.2 g/dL (ref 30.0–36.0)
MCV: 89.1 fL (ref 78.0–100.0)
MONOS PCT: 8 %
Monocytes Absolute: 0.5 10*3/uL (ref 0.1–1.0)
NEUTROS ABS: 3.5 10*3/uL (ref 1.7–7.7)
NEUTROS PCT: 54 %
Platelets: 226 10*3/uL (ref 150–400)
RBC: 4.23 MIL/uL (ref 3.87–5.11)
RDW: 13.5 % (ref 11.5–15.5)
WBC: 6.5 10*3/uL (ref 4.0–10.5)

## 2017-11-24 LAB — BASIC METABOLIC PANEL
Anion gap: 9 (ref 5–15)
BUN: 6 mg/dL (ref 6–20)
CHLORIDE: 105 mmol/L (ref 101–111)
CO2: 22 mmol/L (ref 22–32)
CREATININE: 0.42 mg/dL — AB (ref 0.44–1.00)
Calcium: 8.4 mg/dL — ABNORMAL LOW (ref 8.9–10.3)
GFR calc Af Amer: >60 mL/min (ref >60)
GFR calc non Af Amer: >60 mL/min (ref >60)
Glucose, Bld: 114 mg/dL — ABNORMAL HIGH (ref 65–99)
POTASSIUM: 3.3 mmol/L — AB (ref 3.5–5.1)
Sodium: 136 mmol/L (ref 135–145)

## 2017-11-24 LAB — GLUCOSE, CAPILLARY: Glucose-Capillary: 117 mg/dL — ABNORMAL HIGH (ref 65–99)

## 2017-11-24 LAB — LACTIC ACID, PLASMA: Lactic Acid, Venous: 0.7 mmol/L (ref 0.5–1.9)

## 2017-11-24 MED ORDER — SENNOSIDES-DOCUSATE SODIUM 8.6-50 MG PO TABS
1.0000 | ORAL_TABLET | Freq: Two times a day (BID) | ORAL | Status: DC
  Administered 2017-11-24 – 2017-11-27 (×6): 1 via ORAL
  Filled 2017-11-24 (×6): qty 1

## 2017-11-24 NOTE — Progress Notes (Signed)
PROGRESS NOTE  [name] [name] ZOX:[id number] DOB: 07-28-1960 DOA: 11/21/2017 PCP: Nash Shearer, MD  HPI/Recap of past 24 hours: [name] [name] is a 57 year old female with medical history significant for recurrent UTIs, CVA, alcohol and tobacco abuse, presented from SNF secondary to altered mental status.  Patient was found down in her room, was unable to provide any history.  Per discussion with nurse at SNF, patient was her normal self the day prior to her visits in the ER.  In the ED, lactic acid was noted to be 2.28, CT head and cervical spine significant for chronic ischemia with multilevel degenerative disc disease.  Patient admitted for further management.  Today, patient noted to be much better, oriented, cooperative, pleasant, eating her meals. Reported being constipated.  Assessment/Plan: Principal Problem:   Altered mental status, unspecified Active Problems:   Tobacco abuse   Alcohol abuse   History of CVA (cerebrovascular accident)   Acute lower UTI  Acute metabolic encephalopathy Likely UTI, Vs ?? Wernicke Korsakoff's Vs meds SNF/family members reported she is not at her baseline UDS only positive for benzodiazepines CT head unremarkable for any acute etiology, does show chronic ischemic changes EEG consistent with a mild generalized encephalopathy MRI ordered, pt unable to cooperate even with a dose of haldol (may not be needed anymore) Continue high dose thiamine for possible wernicke (given hx of heavy alcohol use)-500 mg 3 times daily for 2 days, and 250 mg once daily for 5 days Monitor closely  ??Possible syncope Patient found down, unwitnessed History of falls from alcohol intoxication, history of CVAs Echo was attempted, but patient was not cooperating (may not be needed anymore) Continue telemetry monitoring, close observation  UTI UA showed large leukocytes, greater than 50 WBC, budding yeast UC grew E.coli 20,000 colonies Continue empiric ceftriaxone,  fluconazole  Elevated lactic acid Resolved Likely due to dehydration  History of CVA Has residual right-sided deficits Continue aspirin  Alcohol abuse Unlikely to be drinking as patient has been in SNF  Chronic anticoagulation Continue Xarelto 2.5 mg once daily for ??PAD, history of CVA  History of chronic pain syndrome Continue home baclofen, gabapentin, oxycodone as needed, Cymbalta Held Zanaflex  Tobacco abuse Nicotine patch   Code Status: DNR  Family Communication: None at bedside  Disposition Plan: Back to SNF once stable   Consultants:  None  Procedures:  EEG  Antimicrobials:  Ceftriaxone  Fluconazole  DVT prophylaxis: Low-dose Xarelto, SCDs   Objective: Vitals:   11/22/17 0514 11/23/17 2141 11/24/17 0355 11/24/17 1347  BP: 104/64 124/79 121/79 121/84  Pulse: (!) 59 80 89 (!) 101  Resp: 12 18 20 12   Temp: 97.6 F (36.4 C) 98.6 F (37 C) 98.6 F (37 C) 98.7 F (37.1 C)  TempSrc: Oral Oral Oral Oral  SpO2: 99% 98% 97% 95%  Weight:        Intake/Output Summary (Last 24 hours) at 11/24/2017 1754 Last data filed at 11/24/2017 1730 Gross per 24 hour  Intake 2017.5 ml  Output 3000 ml  Net -982.5 ml   Filed Weights   11/21/17 1355  Weight: 61.2 kg (134 lb 14.7 oz)    Exam:   General NAD, oriented, scaly rash on face  Cardiovascular: S1, S2 present  Respiratory: CTAB  Abdomen: Soft, nontender, nondistended, bowel sounds present  Musculoskeletal: Lower extremities contracted  Skin: Normal  Psychiatry: Normal mood  Neuro: Residual right-sided weakness, no other focal neurologic deficit noted   Data Reviewed: CBC: Recent Labs  Lab 11/21/17 0846  11/22/17 1121 11/24/17 0540  WBC 7.9 6.8 6.5  NEUTROABS 5.4  --  3.5  HGB 16.0* 12.9 12.9  HCT 47.7* 39.5 37.7  MCV 94.5 93.6 89.1  PLT 258 211 226   Basic Metabolic Panel: Recent Labs  Lab 11/21/17 0846 11/22/17 1121 11/24/17 0540  NA 143 135 136  K 4.8 4.1 3.3*  CL  101 108 105  CO2 30 17* 22  GLUCOSE 113* 73 114*  BUN 14 11 6   CREATININE 0.56 0.55 0.42*  CALCIUM 9.8 8.2* 8.4*   GFR: CrCl cannot be calculated (Unknown ideal weight.). Liver Function Tests: Recent Labs  Lab 11/21/17 0846  AST 23  ALT 18  ALKPHOS 88  BILITOT 0.8  PROT 8.1  ALBUMIN 4.1   No results for input(s): LIPASE, AMYLASE in the last 168 hours. No results for input(s): AMMONIA in the last 168 hours. Coagulation Profile: No results for input(s): INR, PROTIME in the last 168 hours. Cardiac Enzymes: No results for input(s): CKTOTAL, CKMB, CKMBINDEX, TROPONINI in the last 168 hours. BNP (last 3 results) No results for input(s): PROBNP in the last 8760 hours. HbA1C: No results for input(s): HGBA1C in the last 72 hours. CBG: Recent Labs  Lab 11/23/17 2335 11/24/17 1541  GLUCAP 134* 117*   Lipid Profile: No results for input(s): CHOL, HDL, LDLCALC, TRIG, CHOLHDL, LDLDIRECT in the last 72 hours. Thyroid Function Tests: No results for input(s): TSH, T4TOTAL, FREET4, T3FREE, THYROIDAB in the last 72 hours. Anemia Panel: No results for input(s): VITAMINB12, FOLATE, FERRITIN, TIBC, IRON, RETICCTPCT in the last 72 hours. Urine analysis:    Component Value Date/Time   COLORURINE YELLOW 11/21/2017 1018   APPEARANCEUR TURBID (A) 11/21/2017 1018   APPEARANCEUR Hazy 11/26/2012 1642   LABSPEC 1.018 11/21/2017 1018   LABSPEC 1.024 11/26/2012 1642   PHURINE 8.0 11/21/2017 1018   GLUCOSEU NEGATIVE 11/21/2017 1018   GLUCOSEU Negative 11/26/2012 1642   HGBUR SMALL (A) 11/21/2017 1018   BILIRUBINUR NEGATIVE 11/21/2017 1018   BILIRUBINUR Negative 11/26/2012 1642   KETONESUR 20 (A) 11/21/2017 1018   PROTEINUR 30 (A) 11/21/2017 1018   UROBILINOGEN negative 10/10/2012 1633   UROBILINOGEN 1.0 05/08/2008 1210   NITRITE NEGATIVE 11/21/2017 1018   LEUKOCYTESUR LARGE (A) 11/21/2017 1018   LEUKOCYTESUR Negative 11/26/2012 1642   Sepsis  Labs: @LABRCNTIP (procalcitonin:4,lacticidven:4)  ) Recent Results (from the past 240 hour(s))  Urine culture     Status: Abnormal   Collection Time: 11/21/17 10:18 AM  Result Value Ref Range Status   Specimen Description   Final    URINE, CLEAN CATCH Performed at [facility], [address] [place], [place] [address]    Special Requests   Final    NONE Performed at [facility], [address] [place], [place] [address]    Culture 20,000 COLONIES/mL ESCHERICHIA COLI (A)  Final   Report Status 11/24/2017 FINAL  Final   Organism ID, Bacteria ESCHERICHIA COLI (A)  Final      Susceptibility   Escherichia coli - MIC*    AMPICILLIN 8 SENSITIVE Sensitive     CEFAZOLIN <=4 SENSITIVE Sensitive     CEFTRIAXONE <=1 SENSITIVE Sensitive     CIPROFLOXACIN >=4 RESISTANT Resistant     GENTAMICIN <=1 SENSITIVE Sensitive     IMIPENEM <=0.25 SENSITIVE Sensitive     NITROFURANTOIN 128 RESISTANT Resistant     TRIMETH/SULFA <=20 SENSITIVE Sensitive     AMPICILLIN/SULBACTAM 4 SENSITIVE Sensitive     PIP/TAZO <=4 SENSITIVE Sensitive  Extended ESBL NEGATIVE Sensitive     * 20,000 COLONIES/mL ESCHERICHIA COLI      Studies: No results found.  Scheduled Meds: . allopurinol  100 mg Oral Daily  . aspirin EC  81 mg Oral Daily  . DULoxetine  60 mg Oral Daily  . fluconazole  200 mg Oral Daily  . fluticasone  1 spray Each Nare QHS  . folic acid  1 mg Oral Daily  . gabapentin  100 mg Oral TID  . loratadine  10 mg Oral Daily  . multivitamin with minerals  1 tablet Oral Daily  . nicotine  7 mg Transdermal Daily  . rivaroxaban  2.5 mg Oral QPM  . senna-docusate  1 tablet Oral BID  . thiamine injection  250 mg Intravenous Daily    Continuous Infusions: . cefTRIAXone (ROCEPHIN)  IV Stopped (11/24/17 1458)     LOS: 3 days     [name], MD [facility]   If 7PM-7AM, please contact night-coverage [email or web address] Password  [facility] 11/24/2017, 5:54 PM

## 2017-11-25 LAB — GLUCOSE, CAPILLARY
GLUCOSE-CAPILLARY: 98 mg/dL (ref 65–99)
Glucose-Capillary: 113 mg/dL — ABNORMAL HIGH (ref 65–99)
Glucose-Capillary: 130 mg/dL — ABNORMAL HIGH (ref 65–99)
Glucose-Capillary: 60 mg/dL — ABNORMAL LOW (ref 65–99)

## 2017-11-25 LAB — BASIC METABOLIC PANEL
Anion gap: 9 (ref 5–15)
BUN: 6 mg/dL (ref 6–20)
CHLORIDE: 102 mmol/L (ref 101–111)
CO2: 27 mmol/L (ref 22–32)
CREATININE: 0.47 mg/dL (ref 0.44–1.00)
Calcium: 8.7 mg/dL — ABNORMAL LOW (ref 8.9–10.3)
GFR calc Af Amer: >60 mL/min (ref >60)
GFR calc non Af Amer: >60 mL/min (ref >60)
GLUCOSE: 104 mg/dL — AB (ref 65–99)
Potassium: 3.4 mmol/L — ABNORMAL LOW (ref 3.5–5.1)
SODIUM: 138 mmol/L (ref 135–145)

## 2017-11-25 LAB — VITAMIN B1: Vitamin B1 (Thiamine): 100.9 nmol/L (ref 66.5–200.0)

## 2017-11-25 NOTE — Progress Notes (Signed)
PROGRESS NOTE  [name] [name] VWU:[id number] DOB: July 10, 1960 DOA: 11/21/2017 PCP: Nash Shearer, MD  HPI/Recap of past 24 hours: [name] [name] is a 57 year old female with medical history significant for recurrent UTIs, CVA, alcohol and tobacco abuse, presented from SNF secondary to altered mental status.  Patient was found down in her room, was unable to provide any history.  Per discussion with nurse at SNF, patient was her normal self the day prior to her visits in the ER.  In the ED, lactic acid was noted to be 2.28, CT head and cervical spine significant for chronic ischemia with multilevel degenerative disc disease.  Patient admitted for further management.  Today, patient continues to progress, oriented, cooperative, pleasant, eating her meals. Still no BM. Wants a regular diet.  Assessment/Plan: Principal Problem:   Altered mental status, unspecified Active Problems:   Tobacco abuse   Alcohol abuse   History of CVA (cerebrovascular accident)   Acute lower UTI  Acute metabolic encephalopathy Likely UTI, Vs ?? Wernicke Korsakoff's Vs meds UDS only positive for benzodiazepines CT head unremarkable for any acute etiology, does show chronic ischemic changes EEG consistent with a mild generalized encephalopathy MRI ordered, pt unable to cooperate even with a dose of haldol (may not be needed anymore) Continue high dose thiamine for possible wernicke (given hx of heavy alcohol use, noted some nystagmus, encephalopathy, repetitive words)-500 mg 3 times daily for 2 days, and 250 mg once daily for 5 days Monitor closely  ??Possible syncope Patient found down, unwitnessed History of falls from alcohol intoxication, history of CVAs Echo was attempted, but patient was not cooperating (may not be needed anymore) Continue telemetry monitoring, close observation  UTI UA showed large leukocytes, greater than 50 WBC, budding yeast UC grew E.coli 20,000 colonies Continue empiric ceftriaxone,  fluconazole  Elevated lactic acid Resolved Likely due to dehydration  History of CVA Has residual right-sided deficits Continue aspirin  Alcohol abuse Unlikely to be drinking as patient has been in SNF  Chronic anticoagulation Continue Xarelto 2.5 mg once daily for ??PAD, history of CVA  History of chronic pain syndrome Continue home baclofen, gabapentin, oxycodone as needed, Cymbalta Held Zanaflex  Tobacco abuse Nicotine patch   Code Status: DNR  Family Communication: None at bedside  Disposition Plan: Back to SNF once stable   Consultants:  None  Procedures:  EEG  Antimicrobials:  Ceftriaxone  Fluconazole  DVT prophylaxis: Low-dose Xarelto, SCDs   Objective: Vitals:   11/24/17 1347 11/24/17 2119 11/25/17 0554 11/25/17 1422  BP: 121/84 121/84 128/88 100/72  Pulse: (!) 101 98 94 98  Resp: 12 18 18 16   Temp: 98.7 F (37.1 C) 98.4 F (36.9 C)  98.2 F (36.8 C)  TempSrc: Oral Oral  Oral  SpO2: 95% 97% 98% 96%  Weight:        Intake/Output Summary (Last 24 hours) at 11/25/2017 1612 Last data filed at 11/25/2017 2956 Gross per 24 hour  Intake 1147.5 ml  Output 1800 ml  Net -652.5 ml   Filed Weights   11/21/17 1355  Weight: 61.2 kg (134 lb 14.7 oz)    Exam:   General NAD, oriented, scaly rash on face  Cardiovascular: S1, S2 present  Respiratory: CTAB  Abdomen: Soft, nontender, nondistended, bowel sounds present  Musculoskeletal: Lower extremities contracted  Skin: Normal  Psychiatry: Normal mood  Neuro: Residual right-sided weakness, no other focal neurologic deficit noted   Data Reviewed: CBC: Recent Labs  Lab 11/21/17 0846 11/22/17 1121 11/24/17 0540  WBC 7.9 6.8 6.5  NEUTROABS 5.4  --  3.5  HGB 16.0* 12.9 12.9  HCT 47.7* 39.5 37.7  MCV 94.5 93.6 89.1  PLT 258 211 226   Basic Metabolic Panel: Recent Labs  Lab 11/21/17 0846 11/22/17 1121 11/24/17 0540 11/25/17 0448  NA 143 135 136 138  K 4.8 4.1 3.3* 3.4*    CL 101 108 105 102  CO2 30 17* 22 27  GLUCOSE 113* 73 114* 104*  BUN 14 11 6 6   CREATININE 0.56 0.55 0.42* 0.47  CALCIUM 9.8 8.2* 8.4* 8.7*   GFR: CrCl cannot be calculated (Unknown ideal weight.). Liver Function Tests: Recent Labs  Lab 11/21/17 0846  AST 23  ALT 18  ALKPHOS 88  BILITOT 0.8  PROT 8.1  ALBUMIN 4.1   No results for input(s): LIPASE, AMYLASE in the last 168 hours. No results for input(s): AMMONIA in the last 168 hours. Coagulation Profile: No results for input(s): INR, PROTIME in the last 168 hours. Cardiac Enzymes: No results for input(s): CKTOTAL, CKMB, CKMBINDEX, TROPONINI in the last 168 hours. BNP (last 3 results) No results for input(s): PROBNP in the last 8760 hours. HbA1C: No results for input(s): HGBA1C in the last 72 hours. CBG: Recent Labs  Lab 11/23/17 2335 11/24/17 1541 11/25/17 0014 11/25/17 0731  GLUCAP 134* 117* 130* 113*   Lipid Profile: No results for input(s): CHOL, HDL, LDLCALC, TRIG, CHOLHDL, LDLDIRECT in the last 72 hours. Thyroid Function Tests: No results for input(s): TSH, T4TOTAL, FREET4, T3FREE, THYROIDAB in the last 72 hours. Anemia Panel: No results for input(s): VITAMINB12, FOLATE, FERRITIN, TIBC, IRON, RETICCTPCT in the last 72 hours. Urine analysis:    Component Value Date/Time   COLORURINE YELLOW 11/21/2017 1018   APPEARANCEUR TURBID (A) 11/21/2017 1018   APPEARANCEUR Hazy 11/26/2012 1642   LABSPEC 1.018 11/21/2017 1018   LABSPEC 1.024 11/26/2012 1642   PHURINE 8.0 11/21/2017 1018   GLUCOSEU NEGATIVE 11/21/2017 1018   GLUCOSEU Negative 11/26/2012 1642   HGBUR SMALL (A) 11/21/2017 1018   BILIRUBINUR NEGATIVE 11/21/2017 1018   BILIRUBINUR Negative 11/26/2012 1642   KETONESUR 20 (A) 11/21/2017 1018   PROTEINUR 30 (A) 11/21/2017 1018   UROBILINOGEN negative 10/10/2012 1633   UROBILINOGEN 1.0 05/08/2008 1210   NITRITE NEGATIVE 11/21/2017 1018   LEUKOCYTESUR LARGE (A) 11/21/2017 1018   LEUKOCYTESUR Negative  11/26/2012 1642   Sepsis Labs: @LABRCNTIP (procalcitonin:4,lacticidven:4)  ) Recent Results (from the past 240 hour(s))  Urine culture     Status: Abnormal   Collection Time: 11/21/17 10:18 AM  Result Value Ref Range Status   Specimen Description   Final    URINE, CLEAN CATCH Performed at [facility], [address] [place], [place] [address]    Special Requests   Final    NONE Performed at [facility], [address] [place], [place] [address]    Culture 20,000 COLONIES/mL ESCHERICHIA COLI (A)  Final   Report Status 11/24/2017 FINAL  Final   Organism ID, Bacteria ESCHERICHIA COLI (A)  Final      Susceptibility   Escherichia coli - MIC*    AMPICILLIN 8 SENSITIVE Sensitive     CEFAZOLIN <=4 SENSITIVE Sensitive     CEFTRIAXONE <=1 SENSITIVE Sensitive     CIPROFLOXACIN >=4 RESISTANT Resistant     GENTAMICIN <=1 SENSITIVE Sensitive     IMIPENEM <=0.25 SENSITIVE Sensitive     NITROFURANTOIN 128 RESISTANT Resistant     TRIMETH/SULFA <=20 SENSITIVE Sensitive  AMPICILLIN/SULBACTAM 4 SENSITIVE Sensitive     PIP/TAZO <=4 SENSITIVE Sensitive     Extended ESBL NEGATIVE Sensitive     * 20,000 COLONIES/mL ESCHERICHIA COLI      Studies: No results found.  Scheduled Meds: . allopurinol  100 mg Oral Daily  . aspirin EC  81 mg Oral Daily  . DULoxetine  60 mg Oral Daily  . fluconazole  200 mg Oral Daily  . fluticasone  1 spray Each Nare QHS  . folic acid  1 mg Oral Daily  . gabapentin  100 mg Oral TID  . loratadine  10 mg Oral Daily  . multivitamin with minerals  1 tablet Oral Daily  . nicotine  7 mg Transdermal Daily  . rivaroxaban  2.5 mg Oral QPM  . senna-docusate  1 tablet Oral BID  . thiamine injection  250 mg Intravenous Daily    Continuous Infusions: . cefTRIAXone (ROCEPHIN)  IV 1 g (11/25/17 1514)     LOS: 4 days     [name], MD [facility]   If 7PM-7AM, please contact  night-coverage [email or web address] Password [facility] 11/25/2017, 4:12 PM

## 2017-11-25 NOTE — Progress Notes (Addendum)
Hypoglycemic Event  CBG: 60  Treatment: 120 ml orange juice  Symptoms: none  Follow-up CBG: Time: 1720 CBG Result: 118  Possible Reasons for Event:  Pt did not eat lunch meal  Comments/MD notified:    Chesni Vos W Vicci Reder

## 2017-11-26 ENCOUNTER — Inpatient Hospital Stay (HOSPITAL_COMMUNITY): Payer: Medicaid Other

## 2017-11-26 ENCOUNTER — Encounter (HOSPITAL_COMMUNITY): Payer: Self-pay | Admitting: Radiology

## 2017-11-26 LAB — GLUCOSE, CAPILLARY
GLUCOSE-CAPILLARY: 113 mg/dL — AB (ref 65–99)
GLUCOSE-CAPILLARY: 121 mg/dL — AB (ref 65–99)
Glucose-Capillary: 118 mg/dL — ABNORMAL HIGH (ref 65–99)
Glucose-Capillary: 97 mg/dL (ref 65–99)
Glucose-Capillary: 119 mg/dL — ABNORMAL HIGH (ref 65–99)

## 2017-11-26 MED ORDER — MAGNESIUM CITRATE PO SOLN
1.0000 | Freq: Once | ORAL | Status: AC
  Administered 2017-11-26: 1 via ORAL

## 2017-11-26 MED ORDER — CEPHALEXIN 500 MG PO CAPS
500.0000 mg | ORAL_CAPSULE | Freq: Two times a day (BID) | ORAL | Status: DC
  Administered 2017-11-26 – 2017-11-27 (×3): 500 mg via ORAL
  Filled 2017-11-26 (×3): qty 1

## 2017-11-26 MED ORDER — BACLOFEN 10 MG PO TABS
10.0000 mg | ORAL_TABLET | Freq: Four times a day (QID) | ORAL | Status: DC
  Administered 2017-11-26 – 2017-11-27 (×3): 10 mg via ORAL
  Filled 2017-11-26 (×3): qty 1

## 2017-11-26 MED ORDER — TIZANIDINE HCL 4 MG PO TABS: 4.0000 mg | ORAL_TABLET | Freq: Three times a day (TID) | ORAL | Status: DC | PRN

## 2017-11-26 MED ORDER — BACLOFEN 10 MG PO TABS: 10.0000 mg | ORAL_TABLET | Freq: Four times a day (QID) | ORAL | Status: DC

## 2017-11-26 NOTE — Progress Notes (Signed)
Physical Therapy Treatment Patient Details Name: [name] [name] MRN: [id number] DOB: Mar 23, 1961 Today's Date: 11/26/2017    History of Present Illness 57 y.o. female with medical history significant of recurrent urinary tract infections, CVA, alcohol abuse, tobacco abuse.  Patient presented from SNF secondary to altered mental status    PT Comments    Pt in bed attempted to feed self in supine position.  AxO x 3 and following all instructions.  General bed mobility comments: fetal position body posture unable to self participate/initiate     Required + 2 Total Assist to roll for hygiene.   General transfer comment: + 2 side by side Total Assist from bed to [facility] thwen from [facility] to recliner.  B hips and knees remained flexed. and ADDucted.  Rec nursing use Maxi Move    Positioned pt uright, she was more able to feed herself.    Follow Up Recommendations  SNF     Equipment Recommendations  None recommended by PT    Recommendations for Other Services       Precautions / Restrictions Precautions Precautions: Fall Precaution Comments: severe R hemiparesis CVA with B hip and knee flex contraction and B LE ankle PF     Mobility  Bed Mobility Overal bed mobility: Needs Assistance Bed Mobility: Rolling;Supine to Sit Rolling: +2 for physical assistance;+2 for safety/equipment;Total assist(pt 0%)   Supine to sit: Total assist;+2 for physical assistance;+2 for safety/equipment(pt 0%)     General bed mobility comments: fetal position body posture unable to self participate/initiate     Required + 2 Total Assist to roll for hygiene.    Transfers Overall transfer level: Needs assistance Equipment used: None;2 person hand held assist             General transfer comment: + 2 side by side Total Assist from bed to [facility] thwen from [facility] to recliner.  B hips and knees remained flexed. and ADDucted.  Rec nursing use Maxi Move  Ambulation/Gait             General Gait Details: non  amb   Stairs             Wheelchair Mobility    Modified Rankin (Stroke Patients Only)       Balance                                            Cognition Arousal/Alertness: Awake/alert(improved)   Overall Cognitive Status: Within Functional Limits for tasks assessed                                 General Comments: more orientated to currenty sitauation and following all instructions      Exercises      General Comments        Pertinent Vitals/Pain Pain Assessment: Faces Faces Pain Scale: Hurts even more Pain Location: "everywhere" crying  Pain Descriptors / Indicators: Crying Pain Intervention(s): Monitored during session;Repositioned    Home Living                      Prior Function            PT Goals (current goals can now be found in the care plan section) Progress towards PT goals: Progressing toward goals    Frequency  Min 2X/week      PT Plan Current plan remains appropriate    Co-evaluation              AM-PAC PT "6 Clicks" Daily Activity  Outcome Measure  Difficulty turning over in bed (including adjusting bedclothes, sheets and blankets)?: Unable Difficulty moving from lying on back to sitting on the side of the bed? : Unable Difficulty sitting down on and standing up from a chair with arms (e.g., wheelchair, bedside commode, etc,.)?: Unable Help needed moving to and from a bed to chair (including a wheelchair)?: Total Help needed walking in hospital room?: Total Help needed climbing 3-5 steps with a railing? : Total 6 Click Score: 6    End of Session Equipment Utilized During Treatment: Gait belt Activity Tolerance: Patient limited by pain;Other (comment) Patient left: in chair Nurse Communication: Need for lift equipment PT Visit Diagnosis: Other abnormalities of gait and mobility (R26.89)     Time: [id number] PT Time Calculation (min) (ACUTE ONLY): 25 min  Charges:   $Therapeutic Activity: 23-37 mins                    G Codes:       Felecia ShellingLori Enjoli Tidd  PTA [facility]  Acute  Rehab Pager      [phone]

## 2017-11-26 NOTE — Progress Notes (Signed)
PROGRESS NOTE  [name] [name]:[id number] DOB: 02/09/1961 DOA: 11/21/2017 PCP: [name] Shearer, MD  HPI/Recap of past 24 hours: [name] [name] is a 57 year old female with medical history significant for recurrent UTIs, CVA, alcohol and tobacco abuse, presented from SNF secondary to altered mental status.  Patient was found down in her room, was unable to provide any history.  Per discussion with nurse at SNF, patient was her normal self the day prior to her visits in the ER.  In the ED, lactic acid was noted to be 2.28, CT head and cervical spine significant for chronic ischemia with multilevel degenerative disc disease.  Patient admitted for further management.  Today, patient reported R leg pain, increasing stiffness. Continues to progress, oriented. Still no BM, refusing miralax  Assessment/Plan: Principal Problem:   Altered mental status, unspecified Active Problems:   Tobacco abuse   Alcohol abuse   History of CVA (cerebrovascular accident)   Acute lower UTI  Acute metabolic encephalopathy Likely UTI, Vs ?? Wernicke Korsakoff's Vs meds UDS only positive for benzodiazepines CT head unremarkable for any acute etiology, does show chronic ischemic changes EEG consistent with a mild generalized encephalopathy MRI ordered, pt unable to cooperate even with a dose of haldol (may not be needed anymore) Continue high dose thiamine for possible wernicke (given hx of heavy alcohol use, noted some nystagmus, encephalopathy, repetitive words)-500 mg 3 times daily for 2 days, and 250 mg once daily for 5 days Monitor closely  ??Possible syncope Patient found down, unwitnessed History of falls from alcohol intoxication, history of CVAs Echo was attempted, but patient was not cooperating (may not be needed anymore) Continue telemetry monitoring, close observation  UTI UA showed large leukocytes, greater than 50 WBC, budding yeast UC grew E.coli 20,000 colonies S/P ceftriaxone, fluconazole,  switch to PO keflex for a total of 7 days of AB  Elevated lactic acid Resolved Likely due to dehydration  History of CVA Has residual right-sided deficits Continue aspirin  Alcohol abuse Unlikely to be drinking as patient has been in SNF  Chronic anticoagulation Continue Xarelto 2.5 mg once daily for ??PAD, history of CVA  History of chronic pain syndrome Continue home baclofen, gabapentin, oxycodone as needed, Cymbalta, Zanaflex  Tobacco abuse Nicotine patch   Code Status: DNR  Family Communication: None at bedside  Disposition Plan: Back to SNF on 11/27/17   Consultants:  None  Procedures:  EEG  Antimicrobials:  Keflex  DVT prophylaxis: Low-dose Xarelto, SCDs   Objective: Vitals:   11/25/17 2122 11/26/17 0619 11/26/17 0837 11/26/17 1505  BP: 106/78 119/73  116/72  Pulse: 97 92  94  Resp:  18  16  Temp: 98 F (36.7 C) 98.9 F (37.2 C)  99 F (37.2 C)  TempSrc: Oral Oral  Oral  SpO2: 96% 97%  97%  Weight:      Height:   5\' 7"  (1.702 m)    No intake or output data in the 24 hours ending 11/26/17 1801 Filed Weights   11/21/17 1355  Weight: 61.2 kg (134 lb 14.7 oz)    Exam:   General NAD, oriented, scaly rash on face  Cardiovascular: S1, S2 present  Respiratory: CTAB  Abdomen: Soft, nontender, nondistended, bowel sounds present  Musculoskeletal: Lower extremities contracted  Skin: Normal  Psychiatry: Normal mood  Neuro: Residual right-sided weakness, no other focal neurologic deficit noted   Data Reviewed: CBC: Recent Labs  Lab 11/21/17 0846 11/22/17 1121 11/24/17 0540  WBC 7.9 6.8 6.5  NEUTROABS  5.4  --  3.5  HGB 16.0* 12.9 12.9  HCT 47.7* 39.5 37.7  MCV 94.5 93.6 89.1  PLT 258 211 226   Basic Metabolic Panel: Recent Labs  Lab 11/21/17 0846 11/22/17 1121 11/24/17 0540 11/25/17 0448  NA 143 135 136 138  K 4.8 4.1 3.3* 3.4*  CL 101 108 105 102  CO2 30 17* 22 27  GLUCOSE 113* 73 114* 104*  BUN 14 11 6 6     CREATININE 0.56 0.55 0.42* 0.47  CALCIUM 9.8 8.2* 8.4* 8.7*   GFR: Estimated Creatinine Clearance: 76.8 mL/min (by C-G formula based on SCr of 0.47 mg/dL). Liver Function Tests: Recent Labs  Lab 11/21/17 0846  AST 23  ALT 18  ALKPHOS 88  BILITOT 0.8  PROT 8.1  ALBUMIN 4.1   No results for input(s): LIPASE, AMYLASE in the last 168 hours. No results for input(s): AMMONIA in the last 168 hours. Coagulation Profile: No results for input(s): INR, PROTIME in the last 168 hours. Cardiac Enzymes: No results for input(s): CKTOTAL, CKMB, CKMBINDEX, TROPONINI in the last 168 hours. BNP (last 3 results) No results for input(s): PROBNP in the last 8760 hours. HbA1C: No results for input(s): HGBA1C in the last 72 hours. CBG: Recent Labs  Lab 11/25/17 1727 11/25/17 1916 11/26/17 0254 11/26/17 0707 11/26/17 1505  GLUCAP 118* 98 121* 97 119*   Lipid Profile: No results for input(s): CHOL, HDL, LDLCALC, TRIG, CHOLHDL, LDLDIRECT in the last 72 hours. Thyroid Function Tests: No results for input(s): TSH, T4TOTAL, FREET4, T3FREE, THYROIDAB in the last 72 hours. Anemia Panel: No results for input(s): VITAMINB12, FOLATE, FERRITIN, TIBC, IRON, RETICCTPCT in the last 72 hours. Urine analysis:    Component Value Date/Time   COLORURINE YELLOW 11/21/2017 1018   APPEARANCEUR TURBID (A) 11/21/2017 1018   APPEARANCEUR Hazy 11/26/2012 1642   LABSPEC 1.018 11/21/2017 1018   LABSPEC 1.024 11/26/2012 1642   PHURINE 8.0 11/21/2017 1018   GLUCOSEU NEGATIVE 11/21/2017 1018   GLUCOSEU Negative 11/26/2012 1642   HGBUR SMALL (A) 11/21/2017 1018   BILIRUBINUR NEGATIVE 11/21/2017 1018   BILIRUBINUR Negative 11/26/2012 1642   KETONESUR 20 (A) 11/21/2017 1018   PROTEINUR 30 (A) 11/21/2017 1018   UROBILINOGEN negative 10/10/2012 1633   UROBILINOGEN 1.0 05/08/2008 1210   NITRITE NEGATIVE 11/21/2017 1018   LEUKOCYTESUR LARGE (A) 11/21/2017 1018   LEUKOCYTESUR Negative 11/26/2012 1642   Sepsis  Labs: @LABRCNTIP (procalcitonin:4,lacticidven:4)  ) Recent Results (from the past 240 hour(s))  Urine culture     Status: Abnormal   Collection Time: 11/21/17 10:18 AM  Result Value Ref Range Status   Specimen Description   Final    URINE, CLEAN CATCH Performed at [facility], [address], [place], [place] [address]    Special Requests   Final    NONE Performed at [facility], [address] [place], [place] [address]    Culture 20,000 COLONIES/mL ESCHERICHIA COLI (A)  Final   Report Status 11/24/2017 FINAL  Final   Organism ID, Bacteria ESCHERICHIA COLI (A)  Final      Susceptibility   Escherichia coli - MIC*    AMPICILLIN 8 SENSITIVE Sensitive     CEFAZOLIN <=4 SENSITIVE Sensitive     CEFTRIAXONE <=1 SENSITIVE Sensitive     CIPROFLOXACIN >=4 RESISTANT Resistant     GENTAMICIN <=1 SENSITIVE Sensitive     IMIPENEM <=0.25 SENSITIVE Sensitive     NITROFURANTOIN 128 RESISTANT Resistant     TRIMETH/SULFA <=20 SENSITIVE Sensitive  AMPICILLIN/SULBACTAM 4 SENSITIVE Sensitive     PIP/TAZO <=4 SENSITIVE Sensitive     Extended ESBL NEGATIVE Sensitive     * 20,000 COLONIES/mL ESCHERICHIA COLI      Studies: Ct Pelv - Ct Low Extrem Bilat W/o Cm  Result Date: 11/26/2017 CLINICAL DATA:  Leg pain after a fall. EXAM: CT PELVIS AND BILATERAL LOWER EXTREMITIES WITHOUT CONTRAST TECHNIQUE: Multidetector CT imaging of the pelvis and bilateral lower extremities was performed according to the standard protocol without intravenous contrast. Multiplanar CT image reconstructions were also generated. COMPARISON:  CT scans dated 05/23/2015 and 08/08/2012 FINDINGS: CT PELVIS FINDINGS Urinary Tract: No abnormality visualized. Bladder is mostly obscured by metallic artifact from the hip prostheses. Bowel:  Negative. Vascular/Lymphatic: Aortic atherosclerosis.  No adenopathy. Reproductive:  No mass or other significant abnormality Other:  None. 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 No fractures. No appreciable soft tissue hematomas. CT BILATERAL LOWER EXTREMITY FINDINGS Bones/Joint/Cartilage No acute abnormality. Old healed fracture of the metaphysis of the distal right femur. 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 Muscles and Tendons No acute abnormality. Atrophy of hamstring muscles bilaterally, right greater than left. Soft tissues No acute soft tissue abnormalities. IMPRESSION: : IMPRESSION: 1. No acute abnormality of the pelvis. 2. No acute abnormality of the lower extremities. Electronically Signed   By: Francene BoyersJames  Maxwell M.D.   On: 11/26/2017 15:43    Scheduled Meds: . allopurinol  100 mg Oral Daily  . aspirin EC  81 mg Oral Daily  . cephALEXin  500 mg Oral Q12H  . DULoxetine  60 mg Oral Daily  . fluticasone  1 spray Each Nare QHS  . folic acid  1 mg Oral Daily  . gabapentin  100 mg Oral TID  . loratadine  10 mg Oral Daily  . multivitamin with minerals  1 tablet Oral Daily  . nicotine  7 mg Transdermal Daily  . rivaroxaban  2.5 mg Oral QPM  . senna-docusate  1 tablet Oral BID  . thiamine injection  250 mg Intravenous Daily    Continuous Infusions:    LOS: 5 days     Briant CedarNkeiruka J Jahden Schara, MD [facility]   If 7PM-7AM, please contact night-coverage [email or web address] Password [email or web address] 11/26/2017, 6:01 PM

## 2017-11-27 LAB — BASIC METABOLIC PANEL
Anion gap: 10 (ref 5–15)
BUN: 17 mg/dL (ref 6–20)
CO2: 27 mmol/L (ref 22–32)
Calcium: 8.6 mg/dL — ABNORMAL LOW (ref 8.9–10.3)
Chloride: 102 mmol/L (ref 101–111)
Creatinine, Ser: 0.47 mg/dL (ref 0.44–1.00)
GFR calc non Af Amer: >60 mL/min (ref >60)
Glucose, Bld: 97 mg/dL (ref 65–99)
POTASSIUM: 4.5 mmol/L (ref 3.5–5.1)
SODIUM: 139 mmol/L (ref 135–145)

## 2017-11-27 LAB — GLUCOSE, CAPILLARY
GLUCOSE-CAPILLARY: 141 mg/dL — AB (ref 65–99)
Glucose-Capillary: 85 mg/dL (ref 65–99)

## 2017-11-27 MED ORDER — POLYETHYLENE GLYCOL 3350 17 G PO PACK
17.0000 g | PACK | Freq: Two times a day (BID) | ORAL | Status: DC
  Administered 2017-11-27: 17 g via ORAL
  Filled 2017-11-27: qty 1

## 2017-11-27 MED ORDER — SENNOSIDES-DOCUSATE SODIUM 8.6-50 MG PO TABS: 1.0000 | ORAL_TABLET | Freq: Two times a day (BID) | ORAL | Status: DC

## 2017-11-27 MED ORDER — POLYETHYLENE GLYCOL 3350 17 G PO PACK: 17.0000 g | PACK | Freq: Two times a day (BID) | ORAL | 0 refills | Status: DC

## 2017-11-27 MED ORDER — VITAMIN B-1 100 MG PO TABS
100.0000 mg | ORAL_TABLET | Freq: Every day | ORAL | Status: DC
  Administered 2017-11-27: 100 mg via ORAL
  Filled 2017-11-27: qty 1

## 2017-11-27 MED ORDER — CEPHALEXIN 500 MG PO CAPS: 500.0000 mg | ORAL_CAPSULE | Freq: Two times a day (BID) | ORAL | 0 refills | Status: AC

## 2017-11-27 MED ORDER — THIAMINE HCL 100 MG PO TABS: 100.0000 mg | ORAL_TABLET | Freq: Every day | ORAL | Status: DC

## 2017-11-27 NOTE — Discharge Summary (Signed)
Discharge Summary  [name] [name] FAO:[id number] DOB: 1960-09-13  PCP: Nash ShearerLong, Randy, MD  Admit date: 11/21/2017 Discharge date: 11/27/2017  Time spent: 40 mins  Recommendations for Outpatient Follow-up:  1. PCP   Discharge Diagnoses:  Active Hospital Problems   Diagnosis Date Noted  . Altered mental status, unspecified 11/21/2017  . Tobacco abuse 11/21/2017  . Alcohol abuse 11/21/2017  . History of CVA (cerebrovascular accident) 11/21/2017  . Acute lower UTI 11/21/2017    Resolved Hospital Problems  No resolved problems to display.    Discharge Condition: Stable   Diet recommendation: Heart healthy  Vitals:   11/27/17 0549 11/27/17 1212  BP: 99/67 99/69  Pulse: 93 94  Resp: 18 12  Temp: 98 F (36.7 C) 98.4 F (36.9 C)  SpO2: 91% 98%    History of present illness:  [name] [name] is a 57 year old female with medical history significant for recurrent UTIs, CVA, alcohol and tobacco abuse, presented from SNF secondary to altered mental status.  Patient was found down in her room, was unable to provide any history.  Per discussion with nurse at SNF, patient was her normal self the day prior to her visits in the ER.  In the ED, lactic acid was noted to be 2.28, CT head and cervical spine significant for chronic ischemia with multilevel degenerative disc disease.  Patient admitted for further management.  Today, patient denies any new complaints, still no BM. Gave Mg citrate + miralax, hopefully she will have one soon. Pt stable for discharge back to SNF. No fevers/chills.   Hospital Course:  Principal Problem:   Altered mental status, unspecified Active Problems:   Tobacco abuse   Alcohol abuse   History of CVA (cerebrovascular accident)   Acute lower UTI   Acute metabolic encephalopathy Likely UTI, Vs ?? Wernicke Korsakoff's Vs meds UDS only positive for benzodiazepines CT head unremarkable for any acute etiology, does show chronic ischemic changes EEG consistent  with a mild generalized encephalopathy MRI ordered, pt unable to cooperate even with a dose of haldol (currently not needed anymore as pt is back to her baseline) Received high dose IV thiamine for possible wernicke (given hx of heavy alcohol use, noted some nystagmus, encephalopathy, repetitive words) Continue daily thiamine  ??Possible syncope Patient found down, unwitnessed History of falls from alcohol intoxication, history of CVAs Echo was attempted, but patient was not cooperating No significant event on telemetry monitoring  UTI UA showed large leukocytes, greater than 50 WBC, budding yeast UC grew E.coli 20,000 colonies S/P ceftriaxone, fluconazole, switched to PO keflex for a total of 7 days of AB, last dose tonight  Elevated lactic acid Resolved Likely due to dehydration  History of CVA Has residual right-sided deficits Continue aspirin  Alcohol abuse Unlikely to be drinking as patient has been in SNF  Chronic anticoagulation Continue Xarelto 2.5 mg once daily for ??PAD, history of CVA  History of chronic pain syndrome Continue home baclofen, gabapentin, oxycodone as needed, Cymbalta, Zanaflex  Tobacco abuse Nicotine patch Advised to quit    Procedures:  None  Consultations:  None  Discharge Exam: BP 99/69 (BP Location: Left Arm)   Pulse 94   Temp 98.4 F (36.9 C) (Oral)   Resp 12   Ht 5\' 7"  (1.702 m)   Wt 61.2 kg (134 lb 14.7 oz)   SpO2 98%   BMI 21.13 kg/m   General: NAD Cardiovascular: S1, S2 present Respiratory: CTAB  Discharge Instructions You were cared for by a hospitalist during  your hospital stay. If you have any questions about your discharge medications or the care you received while you were in the hospital after you are discharged, you can call the unit and asked to speak with the hospitalist on call if the hospitalist that took care of you is not available. Once you are discharged, your primary care physician will handle  any further medical issues. Please note that NO REFILLS for any discharge medications will be authorized once you are discharged, as it is imperative that you return to your primary care physician (or establish a relationship with a primary care physician if you do not have one) for your aftercare needs so that they can reassess your need for medications and monitor your lab values.   Allergies as of 11/27/2017      Reactions   Ativan [lorazepam]    Bactrim [sulfamethoxazole-trimethoprim]    Not On MAR   Paxil [paroxetine Hcl]    Not on MAR      Medication List    TAKE these medications   acetaminophen 325 MG tablet Commonly known as:  TYLENOL Take 650 mg by mouth every 6 (six) hours as needed for mild pain or fever.   allopurinol 100 MG tablet Commonly known as:  ZYLOPRIM Take 100 mg by mouth daily.   ARTIFICIAL TEARS 0.4 % Soln Generic drug:  Hypromellose Place 2 drops into both eyes every 4 (four) hours as needed (dry eyes).   aspirin 81 MG tablet Take 81 mg by mouth daily.   baclofen 20 MG tablet Commonly known as:  LIORESAL Take 20 mg by mouth every 6 (six) hours as needed (chronic pain).   benzonatate 100 MG capsule Commonly known as:  TESSALON Take 100 mg by mouth every 8 (eight) hours as needed for cough.   CALCIUM 600-D 600-400 MG-UNIT Tabs Generic drug:  Calcium Carbonate-Vitamin D3 Take 1 tablet by mouth 2 (two) times daily.   cephALEXin 500 MG capsule Commonly known as:  KEFLEX Take 1 capsule (500 mg total) by mouth every 12 (twelve) hours for 1 dose. Last dose tonight to complete 7 days total   cetirizine 10 MG tablet Commonly known as:  ZYRTEC Take 10 mg by mouth daily.   cycloSPORINE 0.05 % ophthalmic emulsion Commonly known as:  RESTASIS Place 1 drop into both eyes 2 (two) times daily.   DULoxetine 60 MG capsule Commonly known as:  CYMBALTA Take 60 mg by mouth daily.   fluticasone 50 MCG/ACT nasal spray Commonly known as:  FLONASE Place 1  spray into both nostrils at bedtime.   gabapentin 300 MG capsule Commonly known as:  NEURONTIN Take 300 mg by mouth 3 (three) times daily.   Melatonin 5 MG Tabs Take 5 mg by mouth at bedtime.   methenamine 1 g tablet Commonly known as:  MANDELAMINE Take 1,000 mg by mouth 2 (two) times daily.   metroNIDAZOLE 1 % gel Commonly known as:  METROGEL Apply 1 application topically daily.   nitrofurantoin (macrocrystal-monohydrate) 100 MG capsule Commonly known as:  MACROBID Take 100 mg by mouth at bedtime.   Oxycodone HCl 10 MG Tabs Take 10 mg by mouth every 6 (six) hours as needed (pain).   polyethylene glycol packet Commonly known as:  MIRALAX / GLYCOLAX Take 17 g by mouth 2 (two) times daily.   senna-docusate 8.6-50 MG tablet Commonly known as:  Senokot-S Take 1 tablet by mouth 2 (two) times daily.   thiamine 100 MG tablet Take 1 tablet (100 mg  total) by mouth daily. Start taking on:  11/28/2017   tiZANidine 4 MG tablet Commonly known as:  ZANAFLEX Take 4 mg by mouth every 8 (eight) hours as needed for muscle spasms.   XARELTO 2.5 MG Tabs tablet Generic drug:  rivaroxaban Take 2.5 mg by mouth every evening.      Allergies  Allergen Reactions  . Ativan [Lorazepam]   . Bactrim [Sulfamethoxazole-Trimethoprim]     Not On MAR  . Paxil [Paroxetine Hcl]     Not on [facility]    Contact information for follow-up providers    Long, Harvie Heck, MD. Schedule an appointment as soon as possible for a visit in 1 week(s).   Specialty:  Internal Medicine Contact information: [address] [place] [address] [phone]            Contact information for after-discharge care    Destination    [facility] .   Service:  Skilled Nursing Contact information: [address] [place] [address] [phone]                   The results of significant diagnostics from this hospitalization (including imaging,  microbiology, ancillary and laboratory) are listed below for reference.    Significant Diagnostic Studies: Ct Head Wo Contrast  Result Date: 11/21/2017 CLINICAL DATA:  Altered level of consciousness. Head injury after unwitnessed fall. EXAM: CT HEAD WITHOUT CONTRAST CT CERVICAL SPINE WITHOUT CONTRAST TECHNIQUE: Multidetector CT imaging of the head and cervical spine was performed following the standard protocol without intravenous contrast. Multiplanar CT image reconstructions of the cervical spine were also generated. COMPARISON:  CT scan of February 28, 2016. FINDINGS: CT HEAD FINDINGS Brain: Mild chronic ischemic white matter disease is noted. Minimal diffuse cortical atrophy is noted. Stable old lacunar infarction is noted in left basal ganglia. No mass effect or midline shift is noted. Ventricular size is within normal limits. There is no evidence of mass lesion, hemorrhage or acute infarction. Vascular: No hyperdense vessel or unexpected calcification. Skull: Normal. Negative for fracture or focal lesion. Sinuses/Orbits: No acute finding. Other: None. CT CERVICAL SPINE FINDINGS Alignment: Normal. Skull base and vertebrae: No acute fracture. No primary bone lesion or focal pathologic process. Soft tissues and spinal canal: No prevertebral fluid or swelling. No visible canal hematoma. Disc levels: Moderate degenerative disc disease is noted at C5-6 and C6-7 with anterior osteophyte formation. Upper chest: Negative. Other: None. IMPRESSION: Mild chronic ischemic white matter disease. Minimal diffuse cortical atrophy. No acute intracranial abnormality seen. Moderate multilevel degenerative disc disease. No acute abnormality seen in the cervical spine. Electronically Signed   By: Lupita Raider, M.D.   On: 11/21/2017 10:04   Ct Cervical Spine Wo Contrast  Result Date: 11/21/2017 CLINICAL DATA:  Altered level of consciousness. Head injury after unwitnessed fall. EXAM: CT HEAD WITHOUT CONTRAST CT CERVICAL  SPINE WITHOUT CONTRAST TECHNIQUE: Multidetector CT imaging of the head and cervical spine was performed following the standard protocol without intravenous contrast. Multiplanar CT image reconstructions of the cervical spine were also generated. COMPARISON:  CT scan of February 28, 2016. FINDINGS: CT HEAD FINDINGS Brain: Mild chronic ischemic white matter disease is noted. Minimal diffuse cortical atrophy is noted. Stable old lacunar infarction is noted in left basal ganglia. No mass effect or midline shift is noted. Ventricular size is within normal limits. There is no evidence of mass lesion, hemorrhage or acute infarction. Vascular: No hyperdense vessel or unexpected calcification.  Skull: Normal. Negative for fracture or focal lesion. Sinuses/Orbits: No acute finding. Other: None. CT CERVICAL SPINE FINDINGS Alignment: Normal. Skull base and vertebrae: No acute fracture. No primary bone lesion or focal pathologic process. Soft tissues and spinal canal: No prevertebral fluid or swelling. No visible canal hematoma. Disc levels: Moderate degenerative disc disease is noted at C5-6 and C6-7 with anterior osteophyte formation. Upper chest: Negative. Other: None. IMPRESSION: Mild chronic ischemic white matter disease. Minimal diffuse cortical atrophy. No acute intracranial abnormality seen. Moderate multilevel degenerative disc disease. No acute abnormality seen in the cervical spine. Electronically Signed   By: Lupita Raider, M.D.   On: 11/21/2017 10:04   Ct Pelv - Ct Low Extrem Bilat W/o Cm  Result Date: 11/26/2017 CLINICAL DATA:  Leg pain after a fall. EXAM: CT PELVIS AND BILATERAL LOWER EXTREMITIES WITHOUT CONTRAST TECHNIQUE: Multidetector CT imaging of the pelvis and bilateral lower extremities was performed according to the standard protocol without intravenous contrast. Multiplanar CT image reconstructions were also generated. COMPARISON:  CT scans dated 05/23/2015 and 08/08/2012 FINDINGS: CT PELVIS FINDINGS  Urinary Tract: No abnormality visualized. Bladder is mostly obscured by metallic artifact from the hip prostheses. Bowel:  Negative. Vascular/Lymphatic: Aortic atherosclerosis.  No adenopathy. Reproductive:  No mass or other significant abnormality Other:  None. Musculoskeletal: Chronic degenerative facet arthritis in the lower lumbar spine. 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 No fractures. No appreciable soft tissue hematomas. CT BILATERAL LOWER EXTREMITY FINDINGS Bones/Joint/Cartilage No acute abnormality. Old healed fracture of the metaphysis of the distal right femur. 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 Muscles and Tendons No acute abnormality. Atrophy of hamstring muscles bilaterally, right greater than left. Soft tissues No acute soft tissue abnormalities. IMPRESSION: : IMPRESSION: 1. No acute abnormality of the pelvis. 2. No acute abnormality of the lower extremities. Electronically Signed   By: Francene Boyers M.D.   On: 11/26/2017 15:43   Dg Chest Port 1 View  Result Date: 11/21/2017 CLINICAL DATA:  Weakness, altered mental status EXAM: PORTABLE CHEST 1 VIEW COMPARISON:  04/21/2014 FINDINGS: Heart and mediastinal contours are within normal limits. No focal opacities or effusions. No acute bony abnormality. IMPRESSION: No active disease. Electronically Signed   By: Charlett Nose M.D.   On: 11/21/2017 09:41    Microbiology: Recent Results (from the past 240 hour(s))  Urine culture     Status: Abnormal   Collection Time: 11/21/17 10:18 AM  Result Value Ref Range Status   Specimen Description   Final    URINE, CLEAN CATCH Performed at [facility] [address], [place], [place] [address]    Special Requests   Final    NONE Performed at [facility], [address], [place], [place] [address]    Culture 20,000 COLONIES/mL ESCHERICHIA COLI (A)  Final   Report Status 11/24/2017 FINAL  Final   Organism ID, Bacteria ESCHERICHIA COLI (A)  Final       Susceptibility   Escherichia coli - MIC*    AMPICILLIN 8 SENSITIVE Sensitive     CEFAZOLIN <=4 SENSITIVE Sensitive     CEFTRIAXONE <=1 SENSITIVE Sensitive     CIPROFLOXACIN >=4 RESISTANT Resistant     GENTAMICIN <=1 SENSITIVE Sensitive     IMIPENEM <=0.25 SENSITIVE Sensitive     NITROFURANTOIN 128 RESISTANT Resistant     TRIMETH/SULFA <=20 SENSITIVE Sensitive     AMPICILLIN/SULBACTAM 4 SENSITIVE Sensitive     PIP/TAZO <=4 SENSITIVE Sensitive     Extended ESBL NEGATIVE Sensitive     *  20,000 COLONIES/mL ESCHERICHIA COLI     Labs: Basic Metabolic Panel: Recent Labs  Lab 11/21/17 0846 11/22/17 1121 11/24/17 0540 11/25/17 0448 11/27/17 0508  NA 143 135 136 138 139  K 4.8 4.1 3.3* 3.4* 4.5  CL 101 108 105 102 102  CO2 30 17* 22 27 27   GLUCOSE 113* 73 114* 104* 97  BUN 14 11 6 6 17   CREATININE 0.56 0.55 0.42* 0.47 0.47  CALCIUM 9.8 8.2* 8.4* 8.7* 8.6*   Liver Function Tests: Recent Labs  Lab 11/21/17 0846  AST 23  ALT 18  ALKPHOS 88  BILITOT 0.8  PROT 8.1  ALBUMIN 4.1   No results for input(s): LIPASE, AMYLASE in the last 168 hours. No results for input(s): AMMONIA in the last 168 hours. CBC: Recent Labs  Lab 11/21/17 0846 11/22/17 1121 11/24/17 0540  WBC 7.9 6.8 6.5  NEUTROABS 5.4  --  3.5  HGB 16.0* 12.9 12.9  HCT 47.7* 39.5 37.7  MCV 94.5 93.6 89.1  PLT 258 211 226   Cardiac Enzymes: No results for input(s): CKTOTAL, CKMB, CKMBINDEX, TROPONINI in the last 168 hours. BNP: BNP (last 3 results) No results for input(s): BNP in the last 8760 hours.  ProBNP (last 3 results) No results for input(s): PROBNP in the last 8760 hours.  CBG: Recent Labs  Lab 11/26/17 0254 11/26/17 0707 11/26/17 1505 11/27/17 0124 11/27/17 0733  GLUCAP 121* 97 119* 141* 85       Signed:  [name], MD [facility] 11/27/2017, 12:53 PM

## 2017-11-27 NOTE — Progress Notes (Addendum)
[facility] informed to sign DNR for on chart.  Patient sister :Herbert SetaHeather informed about patient d/c today.  Nurse call report to: [phone].  [facility] to transport.   Vivi BarrackNicole Alif Petrak, Theresia MajorsLCSWA, MSW Clinical Social Worker  [phone] 11/27/2017  11:23 AM

## 2019-04-23 IMAGING — CT CT PELV - CT LOW EXTREM BILAT W/O CM
2 of 8 series · 11 of 46 positions shown, 17 images · non-contrast
Comparison: CT scans dated 05/23/2015 and 08/08/2012

CLINICAL DATA: Leg pain after a fall.

EXAM:
CT PELVIS AND BILATERAL LOWER EXTREMITIES WITHOUT CONTRAST
TECHNIQUE: Multidetector CT imaging of the pelvis and bilateral lower
extremities was performed according to the standard protocol without
intravenous contrast. Multiplanar CT image reconstructions were also
generated.

[Series 6: axial arterial upper · axial · arterial · 0.98mm/px · z∈[-1080,-240]mm · 8 of 362 slices shown, 13 images]
[im 41/362  soft-tissue]
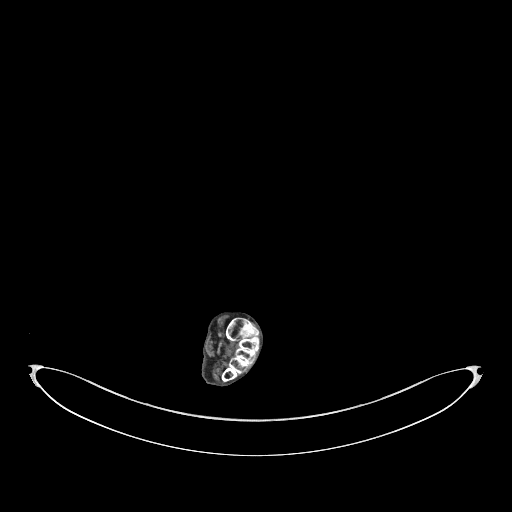
[im 41/362  bone]
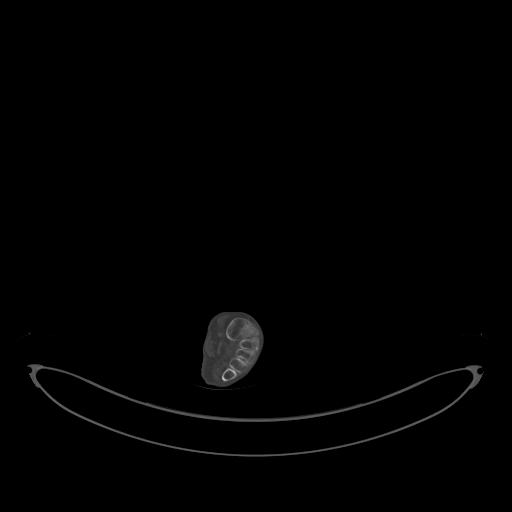
[im 81/362  soft-tissue]
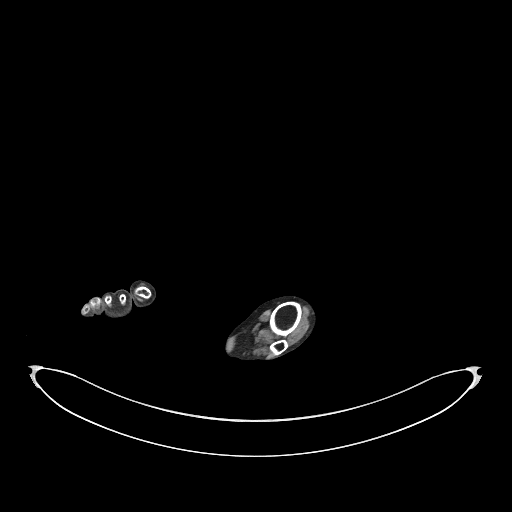
[im 121/362  soft-tissue]
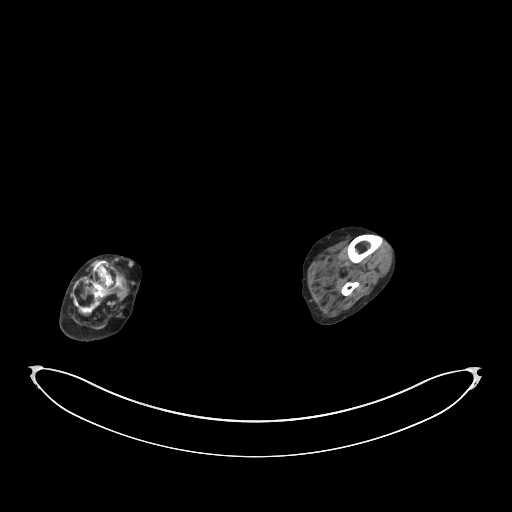
[im 161/362  soft-tissue]
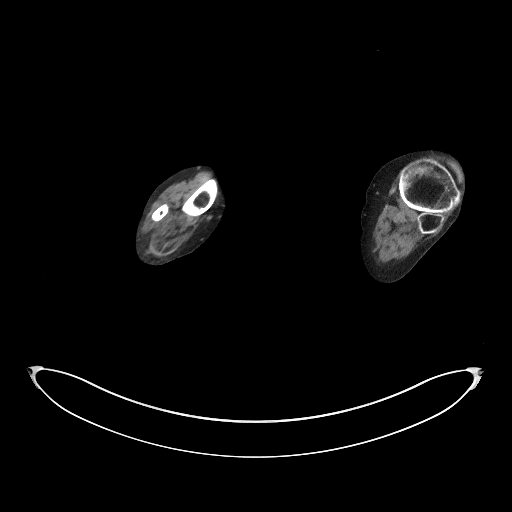
[im 201/362  soft-tissue]
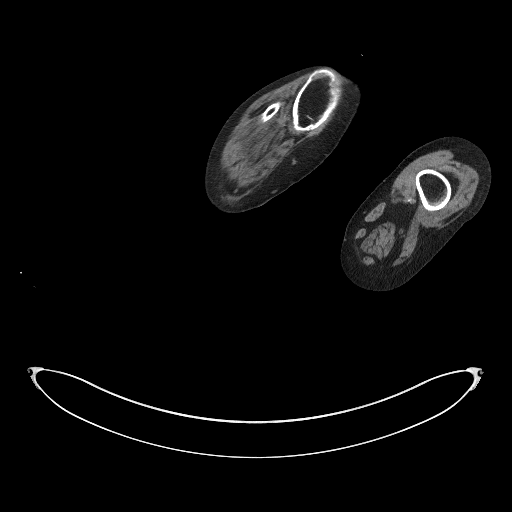
[im 201/362  lung]
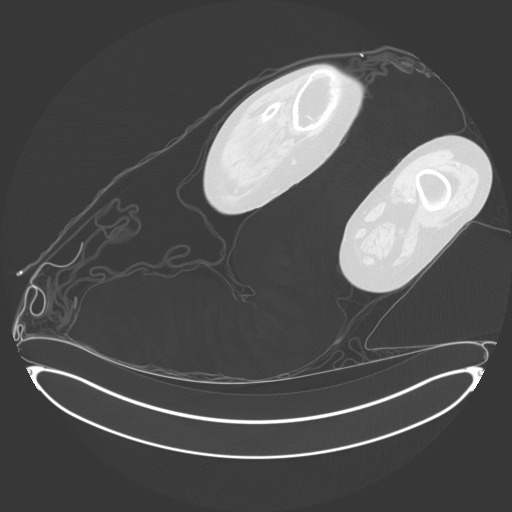
[im 241/362  soft-tissue]
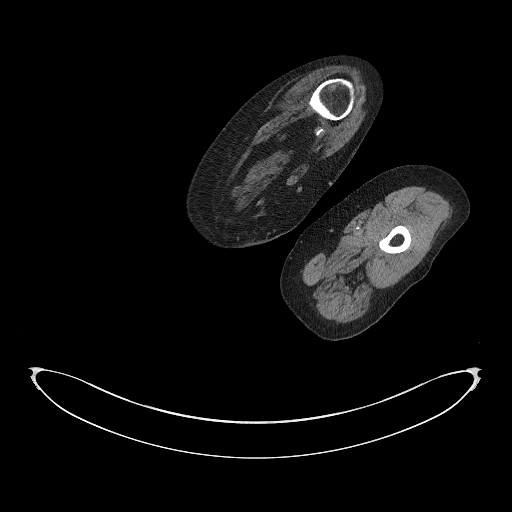
[im 241/362  lung]
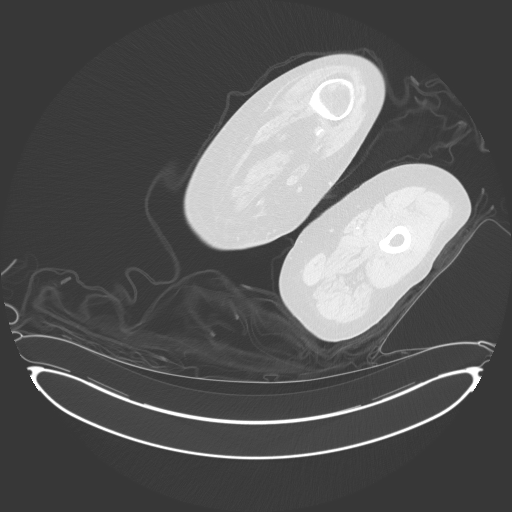
[im 281/362  soft-tissue]
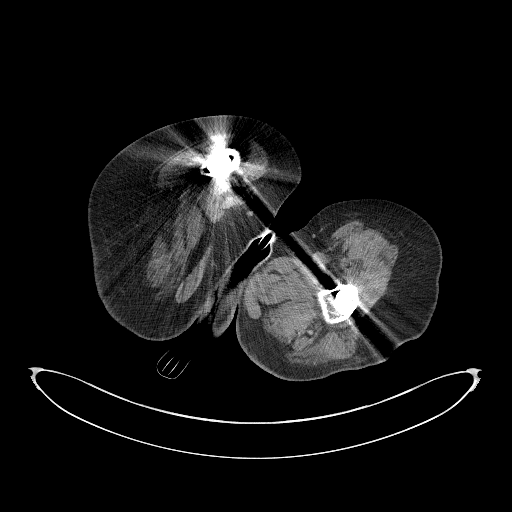
[im 281/362  lung]
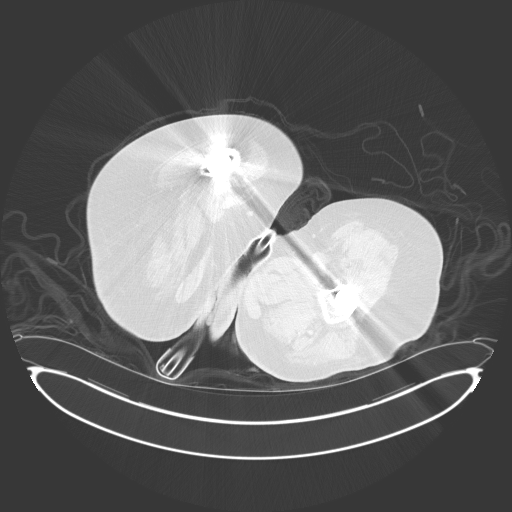
[im 321/362  soft-tissue]
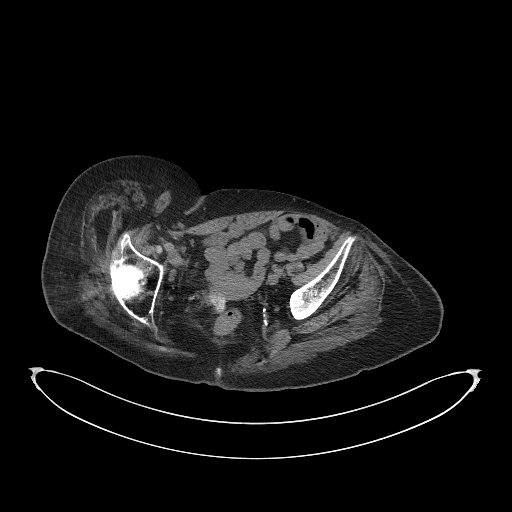
[im 321/362  lung]
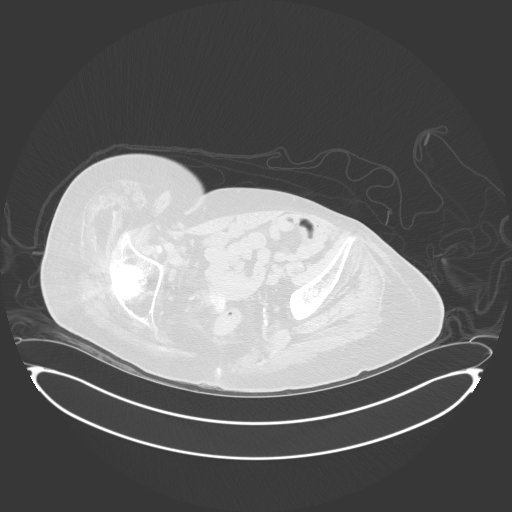

[Series 608: <mpr thick range(6)> · coronal · 1.90mm/px · 3 of 188 slices shown, 4 images]
[im 38/188  soft-tissue]
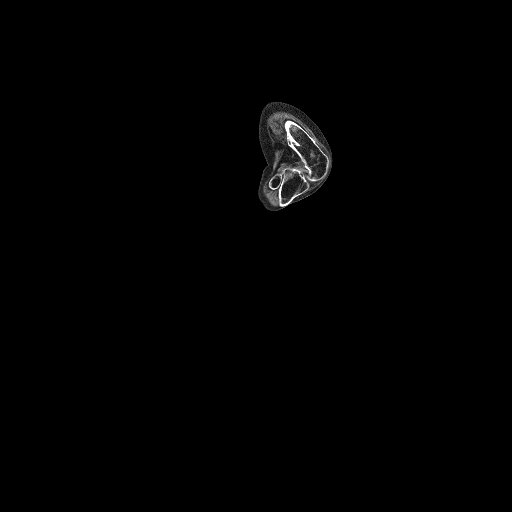
[im 75/188  soft-tissue]
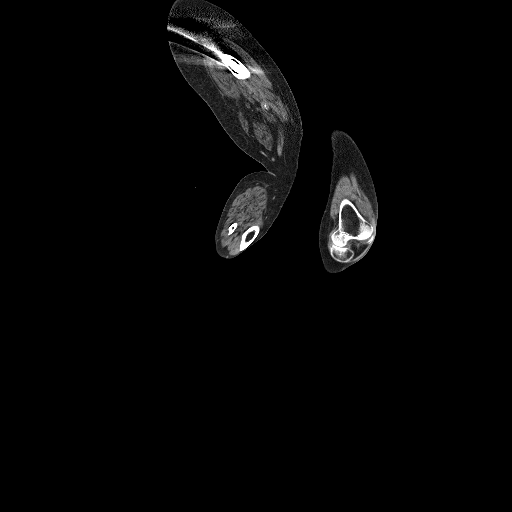
[im 75/188  bone]
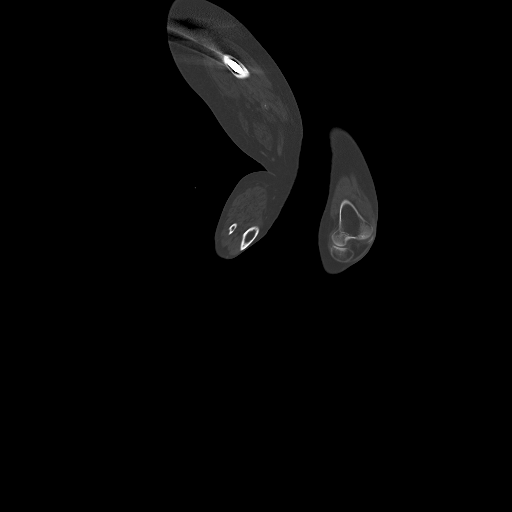
[im 113/188  soft-tissue]
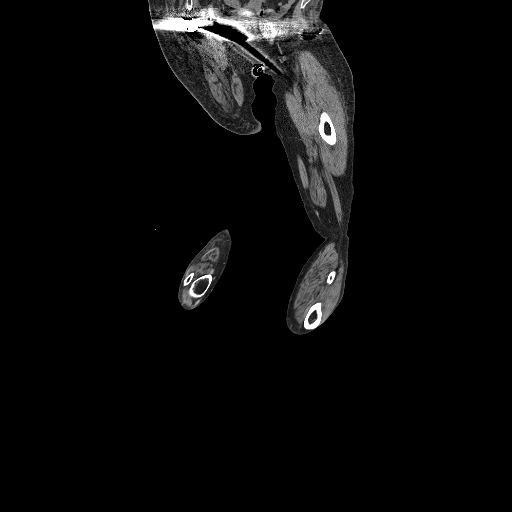

[11 of 46 positions shown; findings below may reference images not displayed]

FINDINGS: CT PELVIS FINDINGS

Urinary Tract: No abnormality visualized. Bladder is mostly obscured
by metallic artifact from the hip prostheses.

Bowel:  Negative.

Vascular/Lymphatic: Aortic atherosclerosis.  No adenopathy.

Reproductive:  No mass or other significant abnormality

Other:  None.

Musculoskeletal: Chronic degenerative facet arthritis in the lower
lumbar spine. Bilateral hip prostheses in place. No fractures. No
appreciable soft tissue hematomas.

CT BILATERAL LOWER EXTREMITY FINDINGS

Bones/Joint/Cartilage

No acute abnormality. Old healed fracture of the metaphysis of the
distal right femur. Bilateral hip prostheses in place.

Muscles and Tendons

No acute abnormality. Atrophy of hamstring muscles bilaterally,
right greater than left.

Soft tissues

No acute soft tissue abnormalities.
IMPRESSION: [:
IMPRESSION: [
1. No acute abnormality of the pelvis.
2. No acute abnormality of the lower extremities.

## 2021-03-08 ENCOUNTER — Encounter (HOSPITAL_COMMUNITY): Payer: Self-pay | Admitting: Emergency Medicine

## 2021-03-08 ENCOUNTER — Emergency Department (HOSPITAL_COMMUNITY): Payer: Medicaid Other

## 2021-03-08 ENCOUNTER — Other Ambulatory Visit: Payer: Self-pay

## 2021-03-08 ENCOUNTER — Emergency Department (HOSPITAL_COMMUNITY)
Admission: EM | Admit: 2021-03-08 | Discharge: 2021-03-09 | Disposition: A | Discharge status: Home / Self Care | Payer: Medicaid Other | Attending: Emergency Medicine | Admitting: Emergency Medicine

## 2021-03-08 DIAGNOSIS — M79672 Pain in left foot: Secondary | ICD-10-CM | POA: Insufficient documentation

## 2021-03-08 DIAGNOSIS — M79671 Pain in right foot: Secondary | ICD-10-CM | POA: Diagnosis not present

## 2021-03-08 LAB — CBC WITH DIFFERENTIAL/PLATELET
Abs Immature Granulocytes: 0.01 10*3/uL (ref 0.00–0.07)
Basophils Absolute: 0 10*3/uL (ref 0.0–0.1)
Basophils Relative: 0 %
Eosinophils Absolute: 0.1 10*3/uL (ref 0.0–0.5)
Eosinophils Relative: 1 %
HCT: 46 % (ref 36.0–46.0)
Hemoglobin: 14.7 g/dL (ref 12.0–15.0)
Immature Granulocytes: 0 %
Lymphocytes Relative: 25 %
Lymphs Abs: 1.6 10*3/uL (ref 0.7–4.0)
MCH: 31.3 pg (ref 26.0–34.0)
MCHC: 32 g/dL (ref 30.0–36.0)
MCV: 98.1 fL (ref 80.0–100.0)
Monocytes Absolute: 0.5 10*3/uL (ref 0.1–1.0)
Monocytes Relative: 7 %
Neutro Abs: 4.4 10*3/uL (ref 1.7–7.7)
Neutrophils Relative %: 67 %
Platelets: 144 10*3/uL — ABNORMAL LOW (ref 150–400)
RBC: 4.69 MIL/uL (ref 3.87–5.11)
RDW: 14.5 % (ref 11.5–15.5)
WBC: 6.6 10*3/uL (ref 4.0–10.5)
nRBC: 0 % (ref 0.0–0.2)

## 2021-03-08 LAB — BASIC METABOLIC PANEL
Anion gap: 13 (ref 5–15)
BUN: 12 mg/dL (ref 6–20)
CO2: 22 mmol/L (ref 22–32)
Calcium: 9.1 mg/dL (ref 8.9–10.3)
Chloride: 100 mmol/L (ref 98–111)
Creatinine, Ser: 0.41 mg/dL — ABNORMAL LOW (ref 0.44–1.00)
GFR, Estimated: >60 mL/min (ref >60)
Glucose, Bld: 70 mg/dL (ref 70–99)
Potassium: 4.1 mmol/L (ref 3.5–5.1)
Sodium: 135 mmol/L (ref 135–145)

## 2021-03-08 MED ORDER — HYDROCODONE-ACETAMINOPHEN 5-325 MG PO TABS
1.0000 | ORAL_TABLET | Freq: Once | ORAL | Status: AC
  Administered 2021-03-08: 1 via ORAL
  Filled 2021-03-08: qty 1

## 2021-03-08 MED ORDER — OXYCODONE-ACETAMINOPHEN 5-325 MG PO TABS
1.0000 | ORAL_TABLET | Freq: Once | ORAL | Status: AC
  Administered 2021-03-08: 1 via ORAL
  Filled 2021-03-08: qty 1

## 2021-03-08 NOTE — ED Provider Notes (Signed)
[name] EMERGENCY DEPARTMENT Provider Note   CSN: [id number] Arrival date & time: 03/08/21  1345     History No chief complaint on file.   [name] [name] is a 60 y.o. female.  Pt reports her right foot has been discolored since her stoke.  Pt reports she injured foot today.  Pt reports her foot gets stuck under foot pedal on wheelchair.   Pt complains of pain in both feet.  Pt seen in 2019 by Dr. Darrick Penna who documented discolored foot and diagnosed with Autonomic dysfunction.  Pt is on xarelto.  Pt reports she does not walk ,   The history is provided by the patient. No language interpreter was used.  Foot Pain This is a new problem. The problem occurs constantly. The problem has not changed since onset.Nothing aggravates the symptoms. Nothing relieves the symptoms. She has tried nothing for the symptoms.      Past Medical History:  Diagnosis Date   Anxiety    Bipolar disorder (HCC)    Chronic pain    CVA (cerebral vascular accident) (HCC) Jul 22, 1999   right side   Depression    Gout    Osteoporosis    Pap smear for cervical cancer screening    5+ years ago normal   Pulmonary embolism (HCC)    bilateral   Tobacco abuse    Vitamin D deficiency     Patient Active Problem List   Diagnosis Date Noted   Altered mental status, unspecified 11/21/2017   Tobacco abuse 11/21/2017   Alcohol abuse 11/21/2017   History of CVA (cerebrovascular accident) 11/21/2017   Acute lower UTI 11/21/2017   Female stress incontinence 10/10/2012   Urinary frequency 10/10/2012   Urinary tract infection, site not specified 09/26/2012    Past Surgical History:  Procedure Laterality Date   APPENDECTOMY     PARTIAL HIP ARTHROPLASTY Right October 2001   TOTAL HIP ARTHROPLASTY Left June 2000     OB History   No obstetric history on file.     Family History  Problem Relation Age of Onset   Stroke Mother    Stroke Brother    Hypertension Brother    Heart murmur  Brother     Social History   Tobacco Use   Smoking status: Some Days    Packs/day: 0.50    Types: Cigarettes   Smokeless tobacco: Never  Substance Use Topics   Alcohol use: Yes    Comment: Varies    Drug use: Yes    Types: Marijuana    Home Medications Prior to Admission medications   Medication Sig Start Date End Date Taking? Authorizing Provider  acetaminophen (TYLENOL) 325 MG tablet Take 650 mg by mouth every 6 (six) hours as needed for mild pain or fever.     [provider]  allopurinol (ZYLOPRIM) 100 MG tablet Take 100 mg by mouth daily.    [provider]  aspirin 81 MG tablet Take 81 mg by mouth daily.    [provider]  baclofen (LIORESAL) 20 MG tablet Take 20 mg by mouth every 6 (six) hours as needed (chronic pain).     [provider]  benzonatate (TESSALON) 100 MG capsule Take 100 mg by mouth every 8 (eight) hours as needed for cough.    [provider]  Calcium Carbonate-Vitamin D3 (CALCIUM 600-D) 600-400 MG-UNIT TABS Take 1 tablet by mouth 2 (two) times daily.    [provider]  cetirizine (  ZYRTEC) 10 MG tablet Take 10 mg by mouth daily.    [provider]  cycloSPORINE (RESTASIS) 0.05 % ophthalmic emulsion Place 1 drop into both eyes 2 (two) times daily.     [provider]  DULoxetine (CYMBALTA) 60 MG capsule Take 60 mg by mouth daily.    [provider]  fluticasone (FLONASE) 50 MCG/ACT nasal spray Place 1 spray into both nostrils at bedtime.    [provider]  gabapentin (NEURONTIN) 300 MG capsule Take 300 mg by mouth 3 (three) times daily.    [provider]  Hypromellose (ARTIFICIAL TEARS) 0.4 % SOLN Place 2 drops into both eyes every 4 (four) hours as needed (dry eyes).    [provider]  Melatonin 5 MG TABS Take 5 mg by mouth at bedtime.    [provider]  methenamine (MANDELAMINE) 1 g tablet Take 1,000 mg by mouth 2 (two) times daily.     [provider]  metroNIDAZOLE (METROGEL) 1 % gel Apply 1 application topically daily.    [provider]  nitrofurantoin, macrocrystal-monohydrate, (MACROBID) 100 MG capsule Take 100 mg by mouth at bedtime.    [provider]  Oxycodone HCl 10 MG TABS Take 10 mg by mouth every 6 (six) hours as needed (pain).    [provider]  polyethylene glycol (MIRALAX / GLYCOLAX) packet Take 17 g by mouth 2 (two) times daily. 11/27/17   [name], MD  rivaroxaban (XARELTO) 2.5 MG TABS tablet Take 2.5 mg by mouth every evening.    [provider]  senna-docusate (SENOKOT-S) 8.6-50 MG tablet Take 1 tablet by mouth 2 (two) times daily. 11/27/17   [name], MD  thiamine 100 MG tablet Take 1 tablet (100 mg total) by mouth daily. 11/28/17   [name], MD  tiZANidine (ZANAFLEX) 4 MG tablet Take 4 mg by mouth every 8 (eight) hours as needed for muscle spasms.     [provider]    Allergies    Ativan [lorazepam], Bactrim [sulfamethoxazole-trimethoprim], and Paxil [paroxetine hcl]  Review of Systems   Review of Systems  All other systems reviewed and are negative.  Physical Exam Updated Vital Signs BP 136/80 (BP Location: Left Arm)   Pulse 83   Temp 98.5 F (36.9 C)   Resp 18   SpO2 97%   Physical Exam Constitutional:      Appearance: Normal appearance.  HENT:     Head: Normocephalic and atraumatic.  Cardiovascular:     Rate and Rhythm: Normal rate.     Pulses: Normal pulses.  Pulmonary:     Effort: Pulmonary effort is normal.  Abdominal:     General: Abdomen is flat.  Musculoskeletal:     Comments: Purple right foot,  good palpable pulse    Neurological:     Mental Status: She is alert. Mental status is at baseline.  Psychiatric:        Mood and Affect: Mood normal.    ED Results / Procedures / Treatments   Labs (all labs ordered are listed, but only abnormal results are displayed) Labs Reviewed  CBC  WITH DIFFERENTIAL/PLATELET - Abnormal; Notable for the following components:      Result Value   Platelets 144 (*)    All other components within normal limits  BASIC METABOLIC PANEL - Abnormal; Notable for the following components:   Creatinine, Ser 0.41 (*)    All other components within normal limits    EKG  None  Radiology DG Foot Complete Left  Result Date: 03/08/2021 CLINICAL DATA:  Left foot pain. EXAM: LEFT FOOT - COMPLETE 3+ VIEW COMPARISON:  None. FINDINGS: There is no evidence of fracture or dislocation. There is no evidence of arthropathy or other focal bone abnormality. Soft tissues are unremarkable. IMPRESSION: Acute abnormality is noted. Electronically Signed   By: Lupita Raider M.D.   On: 03/08/2021 15:11   DG Foot Complete Right  Result Date: 03/08/2021 CLINICAL DATA:  Chronic right foot pain. EXAM: RIGHT FOOT COMPLETE - 3+ VIEW COMPARISON:  None. FINDINGS: There is no evidence of fracture or dislocation. Moderate hallux valgus deformity is seen involving the first metatarsophalangeal joint. Soft tissues are unremarkable. IMPRESSION: Moderate hallux valgus deformity seen involving first metatarsophalangeal joint. No acute abnormality is noted. Electronically Signed   By: Lupita Raider M.D.   On: 03/08/2021 15:13    Procedures Procedures   Medications Ordered in ED Medications  HYDROcodone-acetaminophen (NORCO/VICODIN) 5-325 MG per tablet 1 tablet (1 tablet Oral Given 03/08/21 1621)    ED Course  I have reviewed the triage vital signs and the nursing notes.  Pertinent labs & imaging results that were available during my care of the patient were reviewed by me and considered in my medical decision making (see chart for details).    MDM Rules/Calculators/A&P                           MDM:  Pt given percocet here.  Pt advised to continue current medications  Final Clinical Impression(s) / ED Diagnoses Final diagnoses:  Foot pain, left  Foot pain, right     Rx / DC Orders ED Discharge Orders     None     An After Visit Summary was printed and given to the patient.    Osie Cheeks 03/08/21 2202    Linwood Dibbles, MD 03/09/21 [phone]

## 2021-03-08 NOTE — ED Provider Notes (Addendum)
Emergency Medicine Provider Triage Evaluation Note  [name] [name] , a 60 y.o. female  was evaluated in triage.  Pt complains of bilat foot pain. Pt states her right foot has had purple discoloration for many years since she broke her hip. She c/o bilat foot pain and reports a recent injury to the left foot.  Review of Systems  Positive: Bilat foot pain Negative: Injury/fall  Physical Exam  BP (!) 139/121 (BP Location: Left Arm)   Pulse 74   Temp 98.5 F (36.9 C)   Resp 18   SpO2 100%  Gen:   Awake, no distress   Resp:  Normal effort  MSK:   Moves extremities without difficulty  Other:  Dp pulse able to be dopplered on the right and palpated on the left  Medical Decision Making  Medically screening exam initiated at 4:18 PM.  Appropriate orders placed.  Durenda Guthrie was informed that the remainder of the evaluation will be completed by another provider, this initial triage assessment does not replace that evaluation, and the importance of remaining in the ED until their evaluation is complete.  Pt in triage for labs. States she would like 10 mg oxy or she wants to leave [facility]a. I reviewed MAR and pt not on narcotics. Advised that that is a large dose to give and I would order her 5 mg norco. She is agreeable   Karrie Meres, PA-C 03/08/21 1409    Yakir Wenke S, PA-C 03/08/21 1618    Edwin Dada P, DO 03/08/21 1620

## 2021-03-08 NOTE — Discharge Instructions (Addendum)
Continue current medications, Elevate your foot

## 2021-03-08 NOTE — ED Triage Notes (Signed)
Pt arrives via EMS from [facility]. Pt complains of right foot bothering her. Pt's right foot has been turned in since 2016 from a stroke. Pt unable to explain what is bothering her now about her foot.Pt also told EMS she "scuffed her left foot in her flip flops yesterday." Pedal pulses present, cap refill present per EMS.

## 2021-03-08 NOTE — ED Notes (Signed)
Called [facility] patient is 8th one on the list at the current moment.

## 2021-06-23 ENCOUNTER — Emergency Department (HOSPITAL_COMMUNITY)
Admission: EM | Admit: 2021-06-23 | Discharge: 2021-06-24 | Disposition: A | Discharge status: Home / Self Care | Payer: Medicaid Other | Attending: Emergency Medicine | Admitting: Emergency Medicine

## 2021-06-23 DIAGNOSIS — R112 Nausea with vomiting, unspecified: Secondary | ICD-10-CM | POA: Diagnosis not present

## 2021-06-23 DIAGNOSIS — Z7901 Long term (current) use of anticoagulants: Secondary | ICD-10-CM | POA: Insufficient documentation

## 2021-06-23 DIAGNOSIS — F17210 Nicotine dependence, cigarettes, uncomplicated: Secondary | ICD-10-CM | POA: Insufficient documentation

## 2021-06-23 DIAGNOSIS — R1084 Generalized abdominal pain: Secondary | ICD-10-CM

## 2021-06-23 DIAGNOSIS — Z7982 Long term (current) use of aspirin: Secondary | ICD-10-CM | POA: Insufficient documentation

## 2021-06-23 DIAGNOSIS — Z96643 Presence of artificial hip joint, bilateral: Secondary | ICD-10-CM | POA: Diagnosis not present

## 2021-06-23 DIAGNOSIS — E860 Dehydration: Secondary | ICD-10-CM

## 2021-06-23 LAB — COMPREHENSIVE METABOLIC PANEL
ALT: 57 U/L — ABNORMAL HIGH (ref 0–44)
AST: 22 U/L (ref 15–41)
Albumin: 3.2 g/dL — ABNORMAL LOW (ref 3.5–5.0)
Alkaline Phosphatase: 99 U/L (ref 38–126)
Anion gap: 15 (ref 5–15)
BUN: 26 mg/dL — ABNORMAL HIGH (ref 6–20)
CO2: 24 mmol/L (ref 22–32)
Calcium: 9.3 mg/dL (ref 8.9–10.3)
Chloride: 104 mmol/L (ref 98–111)
Creatinine, Ser: 0.43 mg/dL — ABNORMAL LOW (ref 0.44–1.00)
GFR, Estimated: >60 mL/min (ref >60)
Glucose, Bld: 114 mg/dL — ABNORMAL HIGH (ref 70–99)
Potassium: 3.9 mmol/L (ref 3.5–5.1)
Sodium: 143 mmol/L (ref 135–145)
Total Bilirubin: 0.8 mg/dL (ref 0.3–1.2)
Total Protein: 6.7 g/dL (ref 6.5–8.1)

## 2021-06-23 LAB — CBC
HCT: 44.8 % (ref 36.0–46.0)
Hemoglobin: 14.4 g/dL (ref 12.0–15.0)
MCH: 31.4 pg (ref 26.0–34.0)
MCHC: 32.1 g/dL (ref 30.0–36.0)
MCV: 97.6 fL (ref 80.0–100.0)
Platelets: 296 10*3/uL (ref 150–400)
RBC: 4.59 MIL/uL (ref 3.87–5.11)
RDW: 15.2 % (ref 11.5–15.5)
WBC: 11.4 10*3/uL — ABNORMAL HIGH (ref 4.0–10.5)
nRBC: 0 % (ref 0.0–0.2)

## 2021-06-23 LAB — LIPASE, BLOOD: Lipase: 27 U/L (ref 11–51)

## 2021-06-23 NOTE — ED Provider Notes (Signed)
Emergency Medicine Provider Triage Evaluation Note  [name] [name] , a 60 y.o. female  was evaluated in triage.  Pt complains of nausea and nonbloody vomiting since yesterday.  Patient from [facility].  Patient has associated decreased p.o. intake (due to nausea and vomiting) and chills.  Has tried Zofran given to her by facility however she thinks she threw it up.  Denies fever, chills, abdominal pain, chest pain, shortness of breath.  Denies known sick contacts.  Review of Systems  Positive: Nausea, vomiting Negative: Fever, chills  Physical Exam  BP (!) 130/92 (BP Location: Left Arm)    Pulse 73    Temp 99.4 F (37.4 C) (Oral)    Resp 14    SpO2 92%  Gen:   Awake, no distress   Resp:  Normal effort  MSK:   Moves extremities without difficulty  Other:  Diffuse abdominal tenderness to palpation.   Medical Decision Making  Medically screening exam initiated at 5:15 PM.  Appropriate orders placed.  Durenda Guthrie was informed that the remainder of the evaluation will be completed by another provider, this initial triage assessment does not replace that evaluation, and the importance of remaining in the ED until their evaluation is complete.    Hadi Dubin A, PA-C 06/23/21 1723    Pricilla Loveless, MD 06/23/21 2233

## 2021-06-23 NOTE — ED Notes (Signed)
Patient incont of urine and stool. Patient changed and new brief put on patient

## 2021-06-23 NOTE — ED Triage Notes (Signed)
Pt here from [facility]. Pt here d/t Nausea and vomiting X1 day. PO zofran being thrown up. Pt history of stroke . Not ambulatory.  128/80 HR 80 91% RA

## 2021-06-24 ENCOUNTER — Emergency Department (HOSPITAL_COMMUNITY): Payer: Medicaid Other

## 2021-06-24 ENCOUNTER — Other Ambulatory Visit: Payer: Self-pay

## 2021-06-24 MED ORDER — ONDANSETRON HCL 4 MG/2ML IJ SOLN
4.0000 mg | Freq: Once | INTRAMUSCULAR | Status: AC
  Administered 2021-06-24: 03:00:00 4 mg via INTRAVENOUS
  Filled 2021-06-24: qty 2

## 2021-06-24 MED ORDER — ONDANSETRON 8 MG PO TBDP: 8.0000 mg | ORAL_TABLET | Freq: Three times a day (TID) | ORAL | 0 refills | Status: DC | PRN

## 2021-06-24 MED ORDER — LACTATED RINGERS IV BOLUS
1000.0000 mL | Freq: Once | INTRAVENOUS | Status: AC
  Administered 2021-06-24: 03:00:00 1000 mL via INTRAVENOUS

## 2021-06-24 MED ORDER — IOHEXOL 300 MG/ML  SOLN
80.0000 mL | Freq: Once | INTRAMUSCULAR | Status: AC | PRN
  Administered 2021-06-24: 05:00:00 80 mL via INTRAVENOUS

## 2021-06-24 NOTE — Discharge Instructions (Signed)
°  SEEK IMMEDIATE MEDICAL ATTENTION IF: °The pain does not go away or becomes severe, particularly over the next 8-12 hours.  °A temperature above 100.4F develops.  °Repeated vomiting occurs (multiple episodes).  ° °Blood is being passed in stools or vomit (bright red or black tarry stools).  °Return also if you develop chest pain, difficulty breathing, dizziness or fainting, or become confused, poorly responsive, or inconsolable. ° °

## 2021-06-24 NOTE — ED Notes (Signed)
Repositioned pt and placed pillow on bottom for rectal pain

## 2021-06-24 NOTE — ED Notes (Signed)
ATtempted to call [facility] [phone] no answer

## 2021-06-24 NOTE — ED Notes (Signed)
[facility] her to transport patient back to [facility] [place], attempt x 3 to call report no answer.

## 2021-06-24 NOTE — ED Notes (Signed)
Attempted to call [facility] [phone] no answer

## 2021-06-24 NOTE — ED Provider Notes (Signed)
[facility] EMERGENCY DEPARTMENT Provider Note   CSN: [id number] Arrival date & time: 06/23/21  1643     History Chief Complaint  Patient presents with   Emesis    [name] [name] is a 60 y.o. female.  The history is provided by the patient.  Emesis Severity:  Moderate Duration:  1 day Timing:  Intermittent Progression:  Worsening Chronicity:  New Relieved by:  Nothing Worsened by:  Nothing Associated symptoms: abdominal pain and arthralgias   Associated symptoms: no fever   Patient with previous history of stroke presents to nursing facility with abdominal pain and vomiting.  She reports over the past day she had increasing episodes of nonbloody emesis and diffuse abdominal pain.  No change in her bowel movements No fevers, no chest pain, no shortness of breath    Past Medical History:  Diagnosis Date   Anxiety    Bipolar disorder (HCC)    Chronic pain    CVA (cerebral vascular accident) (HCC) Jul 22, 1999   right side   Depression    Gout    Osteoporosis    Pap smear for cervical cancer screening    5+ years ago normal   Pulmonary embolism (HCC)    bilateral   Tobacco abuse    Vitamin D deficiency     Patient Active Problem List   Diagnosis Date Noted   Altered mental status, unspecified 11/21/2017   Tobacco abuse 11/21/2017   Alcohol abuse 11/21/2017   History of CVA (cerebrovascular accident) 11/21/2017   Acute lower UTI 11/21/2017   Female stress incontinence 10/10/2012   Urinary frequency 10/10/2012   Urinary tract infection, site not specified 09/26/2012    Past Surgical History:  Procedure Laterality Date   APPENDECTOMY     PARTIAL HIP ARTHROPLASTY Right October 2001   TOTAL HIP ARTHROPLASTY Left June 2000     OB History   No obstetric history on file.     Family History  Problem Relation Age of Onset   Stroke Mother    Stroke Brother    Hypertension Brother    Heart murmur Brother     Social History    Tobacco Use   Smoking status: Some Days    Packs/day: 0.50    Types: Cigarettes   Smokeless tobacco: Never  Substance Use Topics   Alcohol use: Yes    Comment: Varies    Drug use: Yes    Types: Marijuana    Home Medications Prior to Admission medications   Medication Sig Start Date End Date Taking? Authorizing Provider  acetaminophen (TYLENOL) 325 MG tablet Take 650 mg by mouth every 6 (six) hours as needed for mild pain or fever.     [provider]  allopurinol (ZYLOPRIM) 100 MG tablet Take 100 mg by mouth daily.    [provider]  aspirin 81 MG tablet Take 81 mg by mouth daily.    [provider]  baclofen (LIORESAL) 20 MG tablet Take 20 mg by mouth every 6 (six) hours as needed (chronic pain).     [provider]  benzonatate (TESSALON) 100 MG capsule Take 100 mg by mouth every 8 (eight) hours as needed for cough.    [provider]  Calcium Carbonate-Vitamin D3 (CALCIUM 600-D) 600-400 MG-UNIT TABS Take 1 tablet by mouth 2 (two) times daily.    [provider]  cetirizine (ZYRTEC) 10 MG tablet Take 10 mg by mouth daily.    [provider]  cycloSPORINE (RESTASIS) 0.05 % ophthalmic emulsion Place 1 drop into both eyes 2 (two) times daily.     [provider]  DULoxetine (CYMBALTA) 60 MG capsule Take 60 mg by mouth daily.    [provider]  fluticasone (FLONASE) 50 MCG/ACT nasal spray Place 1 spray into both nostrils at bedtime.    [provider]  gabapentin (NEURONTIN) 300 MG capsule Take 300 mg by mouth 3 (three) times daily.    [provider]  Hypromellose (ARTIFICIAL TEARS) 0.4 % SOLN Place 2 drops into both eyes every 4 (four) hours as needed (dry eyes).    [provider]  Melatonin 5 MG TABS Take 5 mg by mouth at bedtime.    [provider]  methenamine (MANDELAMINE) 1 g tablet Take 1,000 mg by mouth 2 (two) times daily.    [provider]   metroNIDAZOLE (METROGEL) 1 % gel Apply 1 application topically daily.    [provider]  nitrofurantoin, macrocrystal-monohydrate, (MACROBID) 100 MG capsule Take 100 mg by mouth at bedtime.    [provider]  Oxycodone HCl 10 MG TABS Take 10 mg by mouth every 6 (six) hours as needed (pain).    [provider]  polyethylene glycol (MIRALAX / GLYCOLAX) packet Take 17 g by mouth 2 (two) times daily. 11/27/17   [name], MD  rivaroxaban (XARELTO) 2.5 MG TABS tablet Take 2.5 mg by mouth every evening.    [provider]  senna-docusate (SENOKOT-S) 8.6-50 MG tablet Take 1 tablet by mouth 2 (two) times daily. 11/27/17   [name], MD  thiamine 100 MG tablet Take 1 tablet (100 mg total) by mouth daily. 11/28/17   [name], MD  tiZANidine (ZANAFLEX) 4 MG tablet Take 4 mg by mouth every 8 (eight) hours as needed for muscle spasms.     [provider]    Allergies    Ativan [lorazepam], Bactrim [sulfamethoxazole-trimethoprim], and Paxil [paroxetine hcl]  Review of Systems   Review of Systems  Constitutional:  Negative for fever.  Cardiovascular:  Negative for chest pain.  Gastrointestinal:  Positive for abdominal pain and vomiting.  Musculoskeletal:  Positive for arthralgias.  All other systems reviewed and are negative.  Physical Exam Updated Vital Signs BP (!) 129/92 (BP Location: Right Arm)    Pulse 73    Temp 98.5 F (36.9 C) (Oral)    Resp 20    SpO2 96%   Physical Exam CONSTITUTIONAL: Frail, appears older than stated HEAD: Normocephalic/atraumatic EYES: EOMI/PERRL ENMT: Mucous membranes dry NECK: supple no meningeal signs SPINE/BACK:entire spine nontender CV: S1/S2 noted, no murmurs/rubs/gallops noted LUNGS: Lungs are clear to auscultation bilaterally, no apparent distress ABDOMEN: soft, diffuse moderate tenderness, no rebound or guarding, bowel sounds noted throughout abdomen GU:no cva tenderness NEURO: Pt  is awake/alert, patient with chronic right-sided weakness EXTREMITIES: Both legs are contracted and she has diffuse tenderness in both legs. SKIN: warm, color normal PSYCH: Anxious ED Results / Procedures / Treatments   Labs (all labs ordered are listed, but only abnormal results are displayed) Labs Reviewed  COMPREHENSIVE METABOLIC PANEL - Abnormal; Notable for the following components:      Result Value   Glucose, Bld 114 (*)    BUN 26 (*)    Creatinine, Ser 0.43 (*)    Albumin 3.2 (*)    ALT 57 (*)    All other components within normal limits  CBC - Abnormal; Notable for the following components:  WBC 11.4 (*)    All other components within normal limits  LIPASE, BLOOD    EKG ED ECG REPORT   Date: 06/24/2021 0056  Rate: 70  Rhythm: normal sinus rhythm  QRS Axis: normal  Intervals: normal  ST/T Wave abnormalities: normal  Conduction Disutrbances:none    I have personally reviewed the EKG tracing and agree with the computerized printout as noted.  Radiology DG Tibia/Fibula Left  Result Date: 06/24/2021 CLINICAL DATA:  Status post fall 2 weeks ago. EXAM: LEFT TIBIA AND FIBULA - 2 VIEW COMPARISON:  None. FINDINGS: The study is limited secondary to difficult patient positioning. There is no evidence of fracture or other focal bone lesions. Soft tissues are unremarkable. IMPRESSION: Negative. Electronically Signed   By: Aram Candela M.D.   On: 06/24/2021 02:26   DG Tibia/Fibula Right  Result Date: 06/24/2021 CLINICAL DATA:  Fall and right lower extremity pain. EXAM: RIGHT FEMUR 2 VIEWS; RIGHT TIBIA AND FIBULA - 2 VIEW COMPARISON:  Right lower extremity radiograph dated 11/26/2012. FINDINGS: There is a right hip arthroplasty. Arthroplasty appears intact. No acute fracture or dislocation. Evaluation for fracture is however very limited due to advanced osteopenia. Old healed fracture deformity of the distal femoral and proximal tibial metadiaphysis. The soft tissues are  unremarkable. Vascular calcifications noted. IMPRESSION: 1. No acute fracture or dislocation. 2. Advanced osteopenia. 3. Right hip arthroplasty appears intact. Electronically Signed   By: Elgie Collard M.D.   On: 06/24/2021 02:30   CT ABDOMEN PELVIS W CONTRAST  Result Date: 06/24/2021 CLINICAL DATA:  60 year old female with history of acute onset of nonlocalized abdominal pain. Nausea and vomiting. EXAM: CT ABDOMEN AND PELVIS WITH CONTRAST TECHNIQUE: Multidetector CT imaging of the abdomen and pelvis was performed using the standard protocol following bolus administration of intravenous contrast. CONTRAST:  52mL OMNIPAQUE IOHEXOL 300 MG/ML  SOLN COMPARISON:  CT the abdomen and pelvis 08/08/2012. FINDINGS: Lower chest: Unremarkable. Hepatobiliary: No suspicious cystic or solid hepatic lesions. No intra or extrahepatic biliary ductal dilatation. Gallbladder is normal in appearance. Pancreas: No pancreatic mass. No pancreatic ductal dilatation. No pancreatic or peripancreatic fluid collections or inflammatory changes. Spleen: Unremarkable. Adrenals/Urinary Tract: Subcentimeter low-attenuation lesions in both kidneys, too small to definitively characterize, but statistically likely to represent tiny cysts. No hydroureteronephrosis. Urinary bladder is normal in appearance. Bilateral adrenal glands are normal in appearance. Stomach/Bowel: The appearance of the stomach is normal. No pathologic dilatation of small bowel or colon. Large volume of stool in the rectal vault. Stool burden otherwise does not appear excessive. The appendix is not confidently identified and may be surgically absent. Regardless, there are no inflammatory changes noted adjacent to the cecum to suggest the presence of an acute appendicitis at this time. Vascular/Lymphatic: Aortic atherosclerosis, without evidence of aneurysm or dissection in the abdominal or pelvic vasculature. The large mesenteric vessels are all widely patent. No  lymphadenopathy noted in the abdomen or pelvis. Reproductive: Uterus and ovaries are atrophic. Other: No significant volume of ascites.  No pneumoperitoneum. Musculoskeletal: Status post bilateral hip arthroplasties. There are no aggressive appearing lytic or blastic lesions noted in the visualized portions of the skeleton. IMPRESSION: 1. No acute findings are noted in the abdomen or pelvis to account for the patient's symptoms. 2. Relatively large volume of stool in the rectal vault may suggest constipation, however, stool burden elsewhere in the colon is not excessive. 3. Aortic atherosclerosis. 4. Additional incidental findings, as above. Electronically Signed   By: Trudie Reed M.D.   On:  06/24/2021 05:14   DG Femur Min 2 Views Left  Result Date: 06/24/2021 CLINICAL DATA:  Larey Seat 2 weeks ago, pain EXAM: LEFT FEMUR 2 VIEWS COMPARISON:  None. FINDINGS: Frontal and lateral views of the left femur are obtained. Left hip arthroplasty in expected position without evidence of acute complication. Bones are osteopenic. No acute displaced fractures. Mild left knee osteoarthritis. Soft tissues are unremarkable. IMPRESSION: 1. No acute displaced fracture. 2. Osteopenia. 3. Unremarkable left hip arthroplasty. Electronically Signed   By: Sharlet Salina M.D.   On: 06/24/2021 02:27   DG Femur Min 2 Views Right  Result Date: 06/24/2021 CLINICAL DATA:  Fall and right lower extremity pain. EXAM: RIGHT FEMUR 2 VIEWS; RIGHT TIBIA AND FIBULA - 2 VIEW COMPARISON:  Right lower extremity radiograph dated 11/26/2012. FINDINGS: There is a right hip arthroplasty. Arthroplasty appears intact. No acute fracture or dislocation. Evaluation for fracture is however very limited due to advanced osteopenia. Old healed fracture deformity of the distal femoral and proximal tibial metadiaphysis. The soft tissues are unremarkable. Vascular calcifications noted. IMPRESSION: 1. No acute fracture or dislocation. 2. Advanced osteopenia. 3.  Right hip arthroplasty appears intact. Electronically Signed   By: Elgie Collard M.D.   On: 06/24/2021 02:30    Procedures Procedures   Medications Ordered in ED Medications  lactated ringers bolus 1,000 mL (1,000 mLs Intravenous New Bag/Given 06/24/21 0251)  ondansetron (ZOFRAN) injection 4 mg (4 mg Intravenous Given 06/24/21 0251)    ED Course  I have reviewed the triage vital signs and the nursing notes.  Pertinent labs & imaging results that were available during my care of the patient were reviewed by me and considered in my medical decision making (see chart for details).   Patient very poor historian.  Presented for abdominal pain and vomiting.  However patient was contracted and lying on her left side and difficult to examine her legs and had diffuse tenderness.  Therefore I ordered imaging of both legs to evaluate for any occult fracture, these were negative CT abdominal scan pending MDM Rules/Calculators/A&P                         This patient presents to the ED for concern of abdominal pain, this involves an extensive number of treatment options, and is a complaint that carries with it a high risk of complications and morbidity.  The differential diagnosis includes dehydration, bowel obstruction, bowel perforation, cholecystitis   Additional history obtained:  Additional history obtained from unable to contact nursing home External records from outside source obtained and reviewed including previous admission record   Lab Tests:  I Ordered, reviewed, and interpreted labs.  The pertinent results include: Leukocytosis, dehydration   Imaging Studies ordered:  I ordered imaging studies including CT abdomen pelvis and lower extremity film I independently visualized and interpreted imaging which showed no acute findings in the abdomen or extremities I agree with the radiologist interpretation      Medicines ordered and prescription drug management:  I ordered  medication including Zofran for nausea Reevaluation of the patient after these medicines showed that the patient improved I have reviewed the patients home medicines and have made adjustments as needed     Reevaluation:  After the interventions noted above, I reevaluated the patient and found that they have :improved   Dispostion:  After consideration of the diagnostic results and the patients response to treatment feel that the patent would benefit from discharge home.  Patient presented with abdominal pain and vomiting from nursing home.  Patient appeared dehydrated was given fluids with improvement CT scan was performed without acute abdominal emergency or obstruction   There was some questionable signs of constipation, the patient reports she is moving her bowels and does not feel impacted She continues to spit, but no active vomiting.  She is requesting discharge back to the facility.  She reports that she will "start drinking [facility]" when she gets back Will give prescription for Zofran Final Clinical Impression(s) / ED Diagnoses Final diagnoses:  Nausea and vomiting, unspecified vomiting type  Generalized abdominal pain  Dehydration    Rx / DC Orders ED Discharge Orders          Ordered    ondansetron (ZOFRAN-ODT) 8 MG disintegrating tablet  Every 8 hours PRN        06/24/21 0646             [name], MD 06/24/21 [phone]

## 2021-06-24 NOTE — ED Notes (Signed)
[facility] called 7:23; eta "next"

## 2021-06-24 NOTE — ED Notes (Signed)
Patient transported to CT 

## 2021-08-30 ENCOUNTER — Emergency Department (HOSPITAL_COMMUNITY)
Admission: EM | Admit: 2021-08-30 | Discharge: 2021-08-30 | Disposition: A | Discharge status: Skilled Nursing Facility | Payer: Medicaid Other | Attending: Emergency Medicine | Admitting: Emergency Medicine

## 2021-08-30 ENCOUNTER — Emergency Department (HOSPITAL_COMMUNITY): Payer: Medicaid Other

## 2021-08-30 ENCOUNTER — Encounter (HOSPITAL_COMMUNITY): Payer: Self-pay

## 2021-08-30 DIAGNOSIS — S4991XA Unspecified injury of right shoulder and upper arm, initial encounter: Secondary | ICD-10-CM | POA: Diagnosis present

## 2021-08-30 DIAGNOSIS — Z7982 Long term (current) use of aspirin: Secondary | ICD-10-CM | POA: Diagnosis not present

## 2021-08-30 DIAGNOSIS — M25511 Pain in right shoulder: Secondary | ICD-10-CM | POA: Insufficient documentation

## 2021-08-30 DIAGNOSIS — S42291A Other displaced fracture of upper end of right humerus, initial encounter for closed fracture: Secondary | ICD-10-CM | POA: Insufficient documentation

## 2021-08-30 DIAGNOSIS — X58XXXA Exposure to other specified factors, initial encounter: Secondary | ICD-10-CM | POA: Insufficient documentation

## 2021-08-30 NOTE — ED Provider Notes (Signed)
?[facility]-EMERGENCY DEPT ?Provider Note ? ? ?CSN: [id number] ?Arrival date & time: 08/30/21  1009 ? ?  ? ?History ? ?Chief Complaint  ?Patient presents with  ? Arm Injury  ?  ?fracture  ? ? ?[name] [name] is a 61 y.o. 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  EMS reports pt sent here for a fx diagnosed 4 days ago in facility as patient had a "pop and pain" in her right shoulder while being transferred from wheelchair to bed.  She is wheelchair bound. ? ?Isolated injury - no report of fall, head injury, or hip pain ? ? ?HPI ? ?  ? ?Home Medications ?Prior to Admission medications   ?Medication Sig Start Date End Date Taking? Authorizing Provider  ?acetaminophen (TYLENOL) 325 MG tablet Take 650 mg by mouth every 6 (six) hours as needed for mild pain or fever.     [provider]  ?allopurinol (ZYLOPRIM) 100 MG tablet Take 100 mg by mouth daily.    [provider]  ?aspirin 81 MG tablet Take 81 mg by mouth daily.    [provider]  ?baclofen (LIORESAL) 20 MG tablet Take 20 mg by mouth every 6 (six) hours as needed (chronic pain).     [provider]  ?benzonatate (TESSALON) 100 MG capsule Take 100 mg by mouth every 8 (eight) hours as needed for cough.    [provider]  ?Calcium Carbonate-Vitamin D3 (CALCIUM 600-D) 600-400 MG-UNIT TABS Take 1 tablet by mouth 2 (two) times daily.    [provider]  ?cetirizine (ZYRTEC) 10 MG tablet Take 10 mg by mouth daily.    [provider]  ?cycloSPORINE (RESTASIS) 0.05 % ophthalmic emulsion Place 1 drop into both eyes 2 (two) times daily.     [provider]  ?DULoxetine (CYMBALTA) 60 MG capsule Take 60 mg by mouth daily.    [provider]  ?fluticasone (FLONASE) 50 MCG/ACT nasal spray Place 1 spray into both nostrils at bedtime.    [provider]  ?gabapentin (NEURONTIN) 300 MG  capsule Take 300 mg by mouth 3 (three) times daily.    [provider]  ?Hypromellose (ARTIFICIAL TEARS) 0.4 % SOLN Place 2 drops into both eyes every 4 (four) hours as needed (dry eyes).    [provider]  ?Melatonin 5 MG TABS Take 5 mg by mouth at bedtime.    [provider]  ?methenamine (MANDELAMINE) 1 g tablet Take 1,000 mg by mouth 2 (two) times daily.    [provider]  ?metroNIDAZOLE (METROGEL) 1 % gel Apply 1 application topically daily.    [provider]  ?nitrofurantoin, macrocrystal-monohydrate, (MACROBID) 100 MG capsule Take 100 mg by mouth at bedtime.    [provider]  ?ondansetron (ZOFRAN-ODT) 8 MG disintegrating tablet Take 1 tablet (8 mg total) by mouth every 8 (eight) hours as needed. 06/24/21   [name], MD  ?Oxycodone HCl 10 MG TABS Take 10 mg by mouth every 6 (six) hours as needed (pain).    [provider]  ?polyethylene glycol (MIRALAX / GLYCOLAX) packet Take 17 g by mouth 2 (two) times daily. 11/27/17   [name], MD  ?rivaroxaban (XARELTO) 2.5 MG TABS tablet Take 2.5 mg by mouth every evening.    [provider]  ?senna-docusate (SENOKOT-S) 8.6-50 MG tablet Take 1 tablet by mouth 2 (two) times daily. 11/27/17  [name], MD  ?thiamine 100 MG tablet Take 1 tablet (100 mg total) by mouth daily. 11/28/17   [name], MD  ?tiZANidine (ZANAFLEX) 4 MG tablet Take 4 mg by mouth every 8 (eight) hours as needed for muscle spasms.     [provider]  ?   ? ?Allergies    ?Ativan [lorazepam], Bactrim [sulfamethoxazole-trimethoprim], and Paxil [paroxetine hcl]   ? ?Review of Systems   ?Review of Systems ? ?Physical Exam ?Updated Vital Signs ?BP 106/63 (BP Location: Left Arm)   Pulse 82   Temp 98.8 ?F (37.1 ?C) (Oral)   Resp 18   SpO2 100%  ?Physical Exam ?Constitutional:   ?   General: She is not in acute distress. ?HENT:  ?   Head: Normocephalic and atraumatic.  ?Eyes:  ?    Conjunctiva/sclera: Conjunctivae normal.  ?   Pupils: Pupils are equal, round, and reactive to light.  ?Cardiovascular:  ?   Rate and Rhythm: Normal rate and regular rhythm.  ?Pulmonary:  ?   Effort: Pulmonary effort is normal. No respiratory distress.  ?Musculoskeletal:  ?   Comments: Ecchymosis proximal right humerus ?Right extremity contractured (chronic)  ?Skin: ?   General: Skin is warm and dry.  ?Neurological:  ?   General: No focal deficit present.  ?   Mental Status: She is alert. Mental status is at baseline.  ?Psychiatric:     ?   Mood and Affect: Mood normal.     ?   Behavior: Behavior normal.  ? ? ?ED Results / Procedures / Treatments   ?Labs ?(all labs ordered are listed, but only abnormal results are displayed) ?Labs Reviewed - No data to display ? ?EKG ?None ? ?Radiology ?DG Humerus Right ? ?Result Date: 08/30/2021 ?CLINICAL DATA:  Trauma, fracture humerus EXAM: RIGHT HUMERUS - 2+ VIEW COMPARISON:  None. FINDINGS: There is comminuted fracture in the neck of proximal right humerus. There is no significant displacement or angulation at the fracture site. There is no definite evidence of dislocation in the shoulder joint. IMPRESSION: Comminuted, essentially undisplaced fracture is seen in the neck of proximal right humerus. Electronically Signed   By: Ernie Avena M.D.   On: 08/30/2021 10:47   ? ?Procedures ?Procedures  ? ? ?Medications Ordered in ED ?Medications - No data to display ? ?ED Course/ Medical Decision Making/ A&P ?Clinical Course as of 08/30/21 1104  ?Tue Aug 30, 2021  ?1057 I spoke to [facility] nurse and updated regarding humerus fracture, need for ortho outpatient follow up.  Attempted to reach [employer] but no response by phone [MT]  ?  ?Clinical Course User Index ?[MT] Terald Sleeper, MD  ? ?                        ?Medical Decision Making ?Amount and/or Complexity of Data Reviewed ?Radiology: ordered. ? ? ?Xray of humerus here shows per my interpretation nondisplaced  comminuted proximal humerus fracture.  This is a closed fracture.  She is neurovascularly intact.  She will be placed in an arm sling and should have orthopedic follow-up as an outpatient. ? ?Oral report was given to Lea her nurse at [facility] by myself ? ?Supplemental hx from paramedics on patient's arrival ? ?No other traumatic injuries per exam or report; did not feel CTH emergently indicated ? ? ? ? ? ? ? ?Final Clinical Impression(s) / ED Diagnoses ?Final diagnoses:  ?Other closed displaced fracture of proximal end  of right humerus, initial encounter  ? ? ?Rx / DC Orders ?ED Discharge Orders   ? ? None  ? ?  ? ? ?  ?Terald Sleeper, MD ?08/30/21 1104 ? ?

## 2021-08-30 NOTE — ED Notes (Signed)
Per EDP, pt requires arm sling. EDP, Dr. Langston Masker contacted pt's facility and udpated Lea, the pt's nurse. EP attempted to contact Nira Conn, listed as pt's emergency contact with no answer. ?

## 2021-08-30 NOTE — ED Notes (Signed)
[facility] paged for transport 

## 2021-08-30 NOTE — ED Notes (Signed)
Xray at the bedside.

## 2021-08-30 NOTE — Discharge Instructions (Addendum)
Please call to arrange a follow-up appointment with an orthopedic doctor in 1 to 2 weeks.  Keep the arm sling on at all times.  Be careful not to pull on the arm during transfers. ? ?I attempted to call Consuella Lose significant other by phone to give an update, but was not able to reach her. ?

## 2021-08-30 NOTE — ED Notes (Signed)
[facility] here to take pt  

## 2021-08-30 NOTE — ED Notes (Signed)
Ortho paged and en route at this time.  ?

## 2021-08-30 NOTE — ED Notes (Signed)
EDP at the bedside.  ?
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
## 2021-08-30 NOTE — ED Notes (Signed)
Pt cleaned. New brief placed. Purewick in place.  ?

## 2021-09-07 ENCOUNTER — Emergency Department (HOSPITAL_COMMUNITY)
Admission: EM | Admit: 2021-09-07 | Discharge: 2021-09-07 | Disposition: A | Discharge status: Skilled Nursing Facility | Payer: Medicaid Other | Attending: Emergency Medicine | Admitting: Emergency Medicine

## 2021-09-07 ENCOUNTER — Other Ambulatory Visit: Payer: Self-pay

## 2021-09-07 ENCOUNTER — Encounter (HOSPITAL_COMMUNITY): Payer: Self-pay

## 2021-09-07 ENCOUNTER — Ambulatory Visit (INDEPENDENT_AMBULATORY_CARE_PROVIDER_SITE_OTHER): Payer: Self-pay | Admitting: Neurology

## 2021-09-07 VITALS — BP 80/52 | HR 91 | Wt 125.0 lb

## 2021-09-07 DIAGNOSIS — Z7901 Long term (current) use of anticoagulants: Secondary | ICD-10-CM | POA: Diagnosis not present

## 2021-09-07 DIAGNOSIS — B9689 Other specified bacterial agents as the cause of diseases classified elsewhere: Secondary | ICD-10-CM | POA: Insufficient documentation

## 2021-09-07 DIAGNOSIS — R001 Bradycardia, unspecified: Secondary | ICD-10-CM

## 2021-09-07 DIAGNOSIS — Z20822 Contact with and (suspected) exposure to covid-19: Secondary | ICD-10-CM | POA: Diagnosis not present

## 2021-09-07 DIAGNOSIS — Z7982 Long term (current) use of aspirin: Secondary | ICD-10-CM | POA: Diagnosis not present

## 2021-09-07 DIAGNOSIS — X501XXA Overexertion from prolonged static or awkward postures, initial encounter: Secondary | ICD-10-CM | POA: Insufficient documentation

## 2021-09-07 DIAGNOSIS — I959 Hypotension, unspecified: Secondary | ICD-10-CM | POA: Insufficient documentation

## 2021-09-07 DIAGNOSIS — Y92129 Unspecified place in nursing home as the place of occurrence of the external cause: Secondary | ICD-10-CM | POA: Diagnosis not present

## 2021-09-07 DIAGNOSIS — R5383 Other fatigue: Secondary | ICD-10-CM

## 2021-09-07 DIAGNOSIS — S40021A Contusion of right upper arm, initial encounter: Secondary | ICD-10-CM | POA: Insufficient documentation

## 2021-09-07 DIAGNOSIS — L89154 Pressure ulcer of sacral region, stage 4: Secondary | ICD-10-CM | POA: Diagnosis not present

## 2021-09-07 DIAGNOSIS — N390 Urinary tract infection, site not specified: Secondary | ICD-10-CM | POA: Diagnosis not present

## 2021-09-07 DIAGNOSIS — S4991XA Unspecified injury of right shoulder and upper arm, initial encounter: Secondary | ICD-10-CM | POA: Diagnosis present

## 2021-09-07 LAB — URINALYSIS, ROUTINE W REFLEX MICROSCOPIC
Glucose, UA: NEGATIVE mg/dL
Ketones, ur: 15 mg/dL — AB
Nitrite: POSITIVE — AB
Protein, ur: 100 mg/dL — AB
Specific Gravity, Urine: >1.03 — ABNORMAL HIGH (ref 1.005–1.030)
pH: 6 (ref 5.0–8.0)

## 2021-09-07 LAB — CBC
HCT: 36.3 % (ref 36.0–46.0)
Hemoglobin: 11.1 g/dL — ABNORMAL LOW (ref 12.0–15.0)
MCH: 29.8 pg (ref 26.0–34.0)
MCHC: 30.6 g/dL (ref 30.0–36.0)
MCV: 97.6 fL (ref 80.0–100.0)
Platelets: 346 10*3/uL (ref 150–400)
RBC: 3.72 MIL/uL — ABNORMAL LOW (ref 3.87–5.11)
RDW: 13.9 % (ref 11.5–15.5)
WBC: 11.3 10*3/uL — ABNORMAL HIGH (ref 4.0–10.5)
nRBC: 0 % (ref 0.0–0.2)

## 2021-09-07 LAB — BASIC METABOLIC PANEL
Anion gap: 9 (ref 5–15)
BUN: 21 mg/dL — ABNORMAL HIGH (ref 6–20)
CO2: 26 mmol/L (ref 22–32)
Calcium: 8.5 mg/dL — ABNORMAL LOW (ref 8.9–10.3)
Chloride: 108 mmol/L (ref 98–111)
Creatinine, Ser: 0.42 mg/dL — ABNORMAL LOW (ref 0.44–1.00)
GFR, Estimated: >60 mL/min (ref >60)
Glucose, Bld: 167 mg/dL — ABNORMAL HIGH (ref 70–99)
Potassium: 3.5 mmol/L (ref 3.5–5.1)
Sodium: 143 mmol/L (ref 135–145)

## 2021-09-07 LAB — LACTIC ACID, PLASMA: Lactic Acid, Venous: 1.5 mmol/L (ref 0.5–1.9)

## 2021-09-07 LAB — URINALYSIS, MICROSCOPIC (REFLEX): WBC, UA: >50 WBC/hpf (ref 0–5)

## 2021-09-07 LAB — POC OCCULT BLOOD, ED: Fecal Occult Bld: NEGATIVE

## 2021-09-07 LAB — RESP PANEL BY RT-PCR (FLU A&B, COVID) ARPGX2
Influenza A by PCR: NEGATIVE
Influenza B by PCR: NEGATIVE
SARS Coronavirus 2 by RT PCR: NEGATIVE

## 2021-09-07 LAB — CBG MONITORING, ED: Glucose-Capillary: 158 mg/dL — ABNORMAL HIGH (ref 70–99)

## 2021-09-07 MED ORDER — CEPHALEXIN 500 MG PO CAPS: 500.0000 mg | ORAL_CAPSULE | Freq: Two times a day (BID) | ORAL | 0 refills | Status: AC

## 2021-09-07 MED ORDER — SODIUM CHLORIDE 0.9 % IV BOLUS
1000.0000 mL | Freq: Once | INTRAVENOUS | Status: AC
Start: 2021-09-07 — End: 2021-09-07
  Administered 2021-09-07: 1000 mL via INTRAVENOUS

## 2021-09-07 MED ORDER — SODIUM CHLORIDE 0.9 % IV SOLN
1.0000 g | Freq: Once | INTRAVENOUS | Status: AC
  Administered 2021-09-07: 1 g via INTRAVENOUS
  Filled 2021-09-07: qty 10

## 2021-09-07 NOTE — Progress Notes (Signed)
Had to call EMS for patient per Dr.Ahern .Pt was not able to stay awake after she answers questions .  ?

## 2021-09-07 NOTE — ED Notes (Signed)
RN provided pt bag lunch and cranberry juice. ?

## 2021-09-07 NOTE — Discharge Instructions (Addendum)
Take antibiotics as prescribed and complete the full course for your urinary tract infection.  Recheck with your primary care provider.  Monitor blood pressure closely.  Return to the emergency room for any worsening or concerning symptoms.  Monitor decubitus ulcer and treat appropriately. ?
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
## 2021-09-07 NOTE — ED Notes (Signed)
Attempted to give report 2x at [facility]  ?

## 2021-09-07 NOTE — ED Notes (Signed)
[facility] CALLED; NEXT IN LINE FOR TRANSPORT ?

## 2021-09-07 NOTE — ED Notes (Signed)
NT and RN placed two pillows under pt tailbone to relieve pain.  ? ? ?Provided pt cranberry juice ?

## 2021-09-07 NOTE — ED Notes (Signed)
Pt stated she was cold provided pt two warm blankets ?

## 2021-09-07 NOTE — ED Provider Notes (Signed)
?[name] [name] [facility] EMERGENCY DEPARTMENT ?Provider Note ? ? ?CSN: [id number] ?Arrival date & time: 09/07/21  0945 ? ?  ? ?History ? ?Chief Complaint  ?Patient presents with  ? Weakness  ? ? ?[name] [name] is a 61 y.o. female. ? ?Brought in by EMS from [facility] for weakness, called to neurology office to bring to the ER. Complains of pain in her bottom (pressure ulcer), otherwise she denies complaints and does not know why she was brought in today. ?History of PE, CVA with right sided deficits, recent right humerus fracture (wearing sling).  ?Patient denies fall (other than her right humerus fx), changes in bowel or bladder habits, fever, SHOB, CP. Reports good oral intake.  ? ? ?  ? ?Home Medications ?Prior to Admission medications   ?Medication Sig Start Date End Date Taking? Authorizing Provider  ?cephALEXin (KEFLEX) 500 MG capsule Take 1 capsule (500 mg total) by mouth 2 (two) times daily for 5 days. 09/07/21 09/12/21 Yes Jeannie Fend, PA-C  ?acetaminophen (TYLENOL) 325 MG tablet Take 650 mg by mouth every 6 (six) hours as needed for mild pain or fever.     [provider]  ?allopurinol (ZYLOPRIM) 100 MG tablet Take 100 mg by mouth daily.    [provider]  ?aspirin 81 MG tablet Take 81 mg by mouth daily.    [provider]  ?baclofen (LIORESAL) 20 MG tablet Take 20 mg by mouth every 6 (six) hours as needed (chronic pain).     [provider]  ?benzonatate (TESSALON) 100 MG capsule Take 100 mg by mouth every 8 (eight) hours as needed for cough.    [provider]  ?Calcium Carbonate-Vitamin D3 (CALCIUM 600-D) 600-400 MG-UNIT TABS Take 1 tablet by mouth 2 (two) times daily.    [provider]  ?cetirizine (ZYRTEC) 10 MG tablet Take 10 mg by mouth daily.    [provider]  ?cycloSPORINE (RESTASIS) 0.05 % ophthalmic emulsion Place 1 drop into both eyes 2 (two) times daily.     [provider]  ?DULoxetine (CYMBALTA) 60 MG  capsule Take 60 mg by mouth daily.    [provider]  ?fluticasone (FLONASE) 50 MCG/ACT nasal spray Place 1 spray into both nostrils at bedtime.    [provider]  ?gabapentin (NEURONTIN) 300 MG capsule Take 300 mg by mouth 3 (three) times daily.    [provider]  ?Hypromellose (ARTIFICIAL TEARS) 0.4 % SOLN Place 2 drops into both eyes every 4 (four) hours as needed (dry eyes).    [provider]  ?Melatonin 5 MG TABS Take 5 mg by mouth at bedtime.    [provider]  ?methenamine (MANDELAMINE) 1 g tablet Take 1,000 mg by mouth 2 (two) times daily.    [provider]  ?metroNIDAZOLE (METROGEL) 1 % gel Apply 1 application topically daily.    [provider]  ?nitrofurantoin, macrocrystal-monohydrate, (MACROBID) 100 MG capsule Take 100 mg by mouth at bedtime.    [provider]  ?ondansetron (ZOFRAN-ODT) 8 MG disintegrating tablet Take 1 tablet (8 mg total) by mouth every 8 (eight) hours as needed. 06/24/21   [name], MD  ?Oxycodone HCl 10 MG TABS Take 10 mg by mouth every 6 (six) hours as needed (pain).    [provider]  ?polyethylene glycol (MIRALAX / GLYCOLAX) packet Take 17 g by mouth 2 (two) times daily. 11/27/17   [name], MD  ?rivaroxaban (XARELTO) 2.5 MG TABS tablet  Take 2.5 mg by mouth every evening.    [provider]  ?senna-docusate (SENOKOT-S) 8.6-50 MG tablet Take 1 tablet by mouth 2 (two) times daily. 11/27/17   Briant CedarEzenduka, [name], MD  ?thiamine 100 MG tablet Take 1 tablet (100 mg total) by mouth daily. 11/28/17   Briant CedarEzenduka, [name], MD  ?tiZANidine (ZANAFLEX) 4 MG tablet Take 4 mg by mouth every 8 (eight) hours as needed for muscle spasms.     [provider]  ?   ? ?Allergies    ?Ativan [lorazepam], Bactrim [sulfamethoxazole-trimethoprim], and Paxil [paroxetine hcl]   ? ?Review of Systems   ?Review of Systems ?Negative except as per HPI ?Physical Exam ?Updated Vital Signs ?BP  109/78   Pulse 79   Temp 98.4 ?F (36.9 ?C) (Oral)   Resp 12   Ht 5\' 7"  (1.702 m)   Wt 56.7 kg   SpO2 97%   BMI 19.58 kg/m?  ?Physical Exam ?Vitals and nursing note reviewed. Exam conducted with a chaperone present.  ?Constitutional:   ?   General: She is not in acute distress. ?   Appearance: She is not diaphoretic.  ?   Comments: Appears older than stated age/chronically ill-appearing  ?HENT:  ?   Head: Normocephalic and atraumatic.  ?   Mouth/Throat:  ?   Mouth: Mucous membranes are dry.  ?Eyes:  ?   Pupils: Pupils are equal, round, and reactive to light.  ?Cardiovascular:  ?   Rate and Rhythm: Normal rate and regular rhythm.  ?   Pulses: Normal pulses.  ?   Heart sounds: Normal heart sounds.  ?Pulmonary:  ?   Effort: Pulmonary effort is normal.  ?   Breath sounds: Normal breath sounds.  ?Abdominal:  ?   Palpations: Abdomen is soft.  ?   Tenderness: There is no abdominal tenderness.  ?Genitourinary: ?   Rectum: Guaiac result negative.  ?Musculoskeletal:  ?   Cervical back: Neck supple.  ?   Right lower leg: No edema.  ?   Left lower leg: No edema.  ?Skin: ?   General: Skin is warm and dry.  ?   Findings: Bruising present. No erythema or rash.  ?   Comments: Bruising to right upper arm and shoulder from humerus fracture  ?Neurological:  ?   Mental Status: She is alert and oriented to person, place, and time.  ?   Comments: Known right-sided deficits.  Right arm in sling.  ?Psychiatric:     ?   Behavior: Behavior normal.  ? ? ?ED Results / Procedures / Treatments   ?Labs ?(all labs ordered are listed, but only abnormal results are displayed) ?Labs Reviewed  ?BASIC METABOLIC PANEL - Abnormal; Notable for the following components:  ?    Result Value  ? Glucose, Bld 167 (*)   ? BUN 21 (*)   ? Creatinine, Ser 0.42 (*)   ? Calcium 8.5 (*)   ? All other components within normal limits  ?CBC - Abnormal; Notable for the following components:  ? WBC 11.3 (*)   ? RBC 3.72 (*)   ? Hemoglobin 11.1 (*)   ? All other  components within normal limits  ?URINALYSIS, ROUTINE W REFLEX MICROSCOPIC - Abnormal; Notable for the following components:  ? APPearance CLOUDY (*)   ? Specific Gravity, Urine >1.030 (*)   ? Hgb urine dipstick MODERATE (*)   ? Bilirubin Urine SMALL (*)   ? Ketones, ur 15 (*)   ? Protein, ur  100 (*)   ? Nitrite POSITIVE (*)   ? Leukocytes,Ua MODERATE (*)   ? All other components within normal limits  ?URINALYSIS, MICROSCOPIC (REFLEX) - Abnormal; Notable for the following components:  ? Bacteria, UA MANY (*)   ? All other components within normal limits  ?CBG MONITORING, ED - Abnormal; Notable for the following components:  ? Glucose-Capillary 158 (*)   ? All other components within normal limits  ?RESP PANEL BY RT-PCR (FLU A&B, COVID) [id number]  ?CULTURE, BLOOD (ROUTINE X 2)  ?CULTURE, BLOOD (ROUTINE X 2)  ?LACTIC ACID, PLASMA  ?POC OCCULT BLOOD, ED  ? ? ?EKG ?EKG Interpretation ? ?Date/Time:  Wednesday September 07 2021 09:46:42 EDT ?Ventricular Rate:  86 ?PR Interval:  124 ?QRS Duration: 77 ?QT Interval:  378 ?QTC Calculation: 453 ?R Axis:   72 ?Text Interpretation: Sinus rhythm Borderline low voltage, extremity leads Confirmed by Ernie Avena ([id number]) on 09/07/2021 12:17:50 PM ? ?Radiology ?No results found. ? ?Procedures ?Procedures  ? ? ?Medications Ordered in ED ?Medications  ?cefTRIAXone (ROCEPHIN) 1 g in sodium chloride 0.9 % 100 mL IVPB (has no administration in time range)  ?sodium chloride 0.9 % bolus 1,000 mL (0 mLs Intravenous Stopped 09/07/21 1142)  ? ? ?ED Course/ Medical Decision Making/ A&P ?  ?                        ?Medical Decision Making ?Amount and/or Complexity of Data Reviewed ?Labs: ordered. ? ?Risk ?Prescription drug management. ? ? ?This patient presents to the ED for concern of weakness although patient does not have complaints, this involves an extensive number of treatment options, and is a complaint that carries with it a high risk of complications and morbidity.  The differential diagnosis  includes UTI, dehydration, sepsis, electrolyte disturbance, GI bleed ? ? ?Co morbidities that complicate the patient evaluation ? ?CVA, PE, recent left humerus fx, decubitus ulcer ? ? ?Additional history obtained: ? ?Addi

## 2021-09-07 NOTE — ED Notes (Signed)
RN and NT provided pericare and changed pt brief.  ?Pt insisted left side of brief remain unsnapped. ?RN provided sacrum patch for pt pressure ulcer on bottom.  ?

## 2021-09-08 ENCOUNTER — Telehealth (HOSPITAL_BASED_OUTPATIENT_CLINIC_OR_DEPARTMENT_OTHER): Payer: Self-pay

## 2021-09-08 LAB — BLOOD CULTURE ID PANEL (REFLEXED) - BCID2

## 2021-09-08 NOTE — Telephone Encounter (Signed)
This RN made aware of positive pt blood culture and to have patient return to ED by previous shift RN. Attempted to call patient mobil, with no answer and no option for voicemail. Attempted to call the other phone number listed for patient phone went to voicemail and voicemail was full. Will attempt to call again.  ?

## 2021-09-08 NOTE — Telephone Encounter (Signed)
Attempted to call patient again in regards to positive blood cultures with no answer.  ?

## 2021-09-10 LAB — CULTURE, BLOOD (ROUTINE X 2): Special Requests: ADEQUATE

## 2021-09-20 ENCOUNTER — Ambulatory Visit: Payer: Medicaid Other | Admitting: Neurology

## 2021-10-20 ENCOUNTER — Ambulatory Visit: Payer: Medicaid Other | Admitting: Neurology

## 2021-10-20 ENCOUNTER — Encounter: Payer: Self-pay | Admitting: Neurology

## 2021-10-20 NOTE — Progress Notes (Deleted)
[id number]    Provider:  Dr Lucia GaskinsAhern Requesting Provider: Nash ShearerLong, Randy, MD Primary Care Provider:  Nash ShearerLong, Randy, MD  CC:  ***  HPI:  [name] [name] is a 61 y.o. female here as requested by Nash ShearerLong, Randy, MD for bilateral lower extremity weakness. PMHx UTI, AMS, tobacco abuse, hx of CVA with right-sided deficits, PE, right humerus fracture wearing a sling which emergency room stated was recent when she was seen in March of this year.  Patient was seen in the emergency room last month, brought in by EMS, September 07, 2021 I reviewed notes: She arrived for weakness, she reported pain in the bottom of her feet (pressure ulcer) but denied any complaints and did not know why she was brought into the emergency room.  She denied any changes in bowel or bladder habits, fever, or any focal neurologic deficits.  Examination shows an older than stated age/chronically ill-appearing patient, bruising to the right upper arm and shoulder from humerus fracture, otherwise physical exam was unremarkable, neurologically she had known right-sided deficits otherwise no mention of any other neurologic exam findings.  Lab work showed elevated white blood cells at 11.3 and a decrease in her hemoglobin from 14-11 but Hemoccult negative Labs are otherwise unremarkable except for her UA which showed cloudy urine positive for nitrites and leukocytes and many bacteria and she was treated with Rocephin and discharged on Keflex.  She was also found to be hypotensive with a blood pressure in the 70s which improved with 1 L normal saline and remained stable throughout her stay,   Reviewed notes, labs and imaging from outside physicians, which showed ***  Review of Systems: Patient complains of symptoms per HPI as well as the following symptoms ***. Pertinent negatives and positives per HPI. All others negative.   Social History   Socioeconomic History   Marital status: Divorced    Spouse name: Not on file   Number  of children: Not on file   Years of education: Not on file   Highest education level: Not on file  Occupational History   Not on file  Tobacco Use   Smoking status: Some Days    Packs/day: 0.50    Types: Cigarettes   Smokeless tobacco: Never  Substance and Sexual Activity   Alcohol use: Yes    Comment: Varies    Drug use: Yes    Types: Marijuana   Sexual activity: Never  Other Topics Concern   Not on file  Social History Narrative   Not on file   Social Determinants of Health   Financial Resource Strain: Not on file  Food Insecurity: Not on file  Transportation Needs: Not on file  Physical Activity: Not on file  Stress: Not on file  Social Connections: Not on file  Intimate Partner Violence: Not on file    Family History  Problem Relation Age of Onset   Stroke Mother    Stroke Brother    Hypertension Brother    Heart murmur Brother     Past Medical History:  Diagnosis Date   Anxiety    Bipolar disorder (HCC)    Chronic pain    CVA (cerebral vascular accident) (HCC) Jul 22, 1999   right side   Depression    Gout    Osteoporosis    Pap smear for cervical cancer screening    5+ years ago normal   Pulmonary embolism (HCC)    bilateral   Tobacco abuse    Vitamin D  deficiency     Patient Active Problem List   Diagnosis Date Noted   Altered mental status, unspecified 11/21/2017   Tobacco abuse 11/21/2017   Alcohol abuse 11/21/2017   History of CVA (cerebrovascular accident) 11/21/2017   Acute lower UTI 11/21/2017   Female stress incontinence 10/10/2012   Urinary frequency 10/10/2012   Urinary tract infection, site not specified 09/26/2012    Past Surgical History:  Procedure Laterality Date   APPENDECTOMY     PARTIAL HIP ARTHROPLASTY Right October 2001   TOTAL HIP ARTHROPLASTY Left June 2000    Current Outpatient Medications  Medication Sig Dispense Refill   acetaminophen (TYLENOL) 325 MG tablet Take 650 mg by mouth every 6 (six) hours as needed  for mild pain or fever.      allopurinol (ZYLOPRIM) 100 MG tablet Take 100 mg by mouth daily.     aspirin 81 MG tablet Take 81 mg by mouth daily.     baclofen (LIORESAL) 20 MG tablet Take 20 mg by mouth every 6 (six) hours as needed (chronic pain).      benzonatate (TESSALON) 100 MG capsule Take 100 mg by mouth every 8 (eight) hours as needed for cough.     Calcium Carbonate-Vitamin D3 (CALCIUM 600-D) 600-400 MG-UNIT TABS Take 1 tablet by mouth 2 (two) times daily.     cetirizine (ZYRTEC) 10 MG tablet Take 10 mg by mouth daily.     cycloSPORINE (RESTASIS) 0.05 % ophthalmic emulsion Place 1 drop into both eyes 2 (two) times daily.      DULoxetine (CYMBALTA) 60 MG capsule Take 60 mg by mouth daily.     fluticasone (FLONASE) 50 MCG/ACT nasal spray Place 1 spray into both nostrils at bedtime.     gabapentin (NEURONTIN) 300 MG capsule Take 300 mg by mouth 3 (three) times daily.     Hypromellose (ARTIFICIAL TEARS) 0.4 % SOLN Place 2 drops into both eyes every 4 (four) hours as needed (dry eyes).     Melatonin 5 MG TABS Take 5 mg by mouth at bedtime.     methenamine (MANDELAMINE) 1 g tablet Take 1,000 mg by mouth 2 (two) times daily.     metroNIDAZOLE (METROGEL) 1 % gel Apply 1 application topically daily.     nitrofurantoin, macrocrystal-monohydrate, (MACROBID) 100 MG capsule Take 100 mg by mouth at bedtime.     ondansetron (ZOFRAN-ODT) 8 MG disintegrating tablet Take 1 tablet (8 mg total) by mouth every 8 (eight) hours as needed. 4 tablet 0   Oxycodone HCl 10 MG TABS Take 10 mg by mouth every 6 (six) hours as needed (pain).     polyethylene glycol (MIRALAX / GLYCOLAX) packet Take 17 g by mouth 2 (two) times daily. 14 each 0   rivaroxaban (XARELTO) 2.5 MG TABS tablet Take 2.5 mg by mouth every evening.     senna-docusate (SENOKOT-S) 8.6-50 MG tablet Take 1 tablet by mouth 2 (two) times daily.     thiamine 100 MG tablet Take 1 tablet (100 mg total) by mouth daily.     tiZANidine (ZANAFLEX) 4 MG tablet  Take 4 mg by mouth every 8 (eight) hours as needed for muscle spasms.      No current facility-administered medications for this visit.    Allergies as of 10/20/2021 - Review Complete 09/07/2021  Allergen Reaction Noted   Ativan [lorazepam]  09/11/2012   Bactrim [sulfamethoxazole-trimethoprim]  09/11/2012   Paxil [paroxetine hcl]  09/11/2012    Vitals: There were no vitals taken for  this visit. Last Weight:  Wt Readings from Last 1 Encounters:  09/07/21 125 lb (56.7 kg)   Last Height:   Ht Readings from Last 1 Encounters:  09/07/21 5\' 7"  (1.702 m)     Physical exam: Exam: Gen: NAD, conversant, well nourised, obese, well groomed                     CV: RRR, no MRG. No Carotid Bruits. No peripheral edema, warm, nontender Eyes: Conjunctivae clear without exudates or hemorrhage  Neuro: Detailed Neurologic Exam  Speech:    Speech is normal; fluent and spontaneous with normal comprehension.  Cognition:    The patient is oriented to person, place, and time;     recent and remote memory intact;     language fluent;     normal attention, concentration,     fund of knowledge Cranial Nerves:    The pupils are equal, round, and reactive to light. The fundi are normal and spontaneous venous pulsations are present. Visual fields are full to finger confrontation. Extraocular movements are intact. Trigeminal sensation is intact and the muscles of mastication are normal. The face is symmetric. The palate elevates in the midline. Hearing intact. Voice is normal. Shoulder shrug is normal. The tongue has normal motion without fasciculations.   Coordination:    Normal finger to nose and heel to shin. Normal rapid alternating movements.   Gait:    Heel-toe and tandem gait are normal.   Motor Observation:    No asymmetry, no atrophy, and no involuntary movements noted. Tone:    Normal muscle tone.    Posture:    Posture is normal. normal erect    Strength:    Strength is V/V in  the upper and lower limbs.      Sensation: intact to LT     Reflex Exam:  DTR's:    Deep tendon reflexes in the upper and lower extremities are normal bilaterally.   Toes:    The toes are downgoing bilaterally.   Clonus:    Clonus is absent.    Assessment/Plan:    No orders of the defined types were placed in this encounter.  No orders of the defined types were placed in this encounter.   Cc: , MD,  Nash Shearer, MD  Nash Shearer, MD  [facility] [address] [place], [place]  Phone [phone] Fax [phone]

## 2021-11-08 ENCOUNTER — Inpatient Hospital Stay (HOSPITAL_COMMUNITY)
Admission: EM | Admit: 2021-11-08 | Discharge: 2021-11-13 | DRG: 539 | Disposition: A | Discharge status: Skilled Nursing Facility | Payer: Medicaid Other | Attending: Internal Medicine | Admitting: Internal Medicine

## 2021-11-08 ENCOUNTER — Emergency Department (HOSPITAL_COMMUNITY): Payer: Medicaid Other

## 2021-11-08 ENCOUNTER — Other Ambulatory Visit: Payer: Self-pay

## 2021-11-08 DIAGNOSIS — E440 Moderate protein-calorie malnutrition: Secondary | ICD-10-CM | POA: Diagnosis present

## 2021-11-08 DIAGNOSIS — Z66 Do not resuscitate: Secondary | ICD-10-CM | POA: Diagnosis present

## 2021-11-08 DIAGNOSIS — Z6820 Body mass index (BMI) 20.0-20.9, adult: Secondary | ICD-10-CM

## 2021-11-08 DIAGNOSIS — Z8249 Family history of ischemic heart disease and other diseases of the circulatory system: Secondary | ICD-10-CM

## 2021-11-08 DIAGNOSIS — Z86711 Personal history of pulmonary embolism: Secondary | ICD-10-CM

## 2021-11-08 DIAGNOSIS — Z7901 Long term (current) use of anticoagulants: Secondary | ICD-10-CM

## 2021-11-08 DIAGNOSIS — F319 Bipolar disorder, unspecified: Secondary | ICD-10-CM | POA: Diagnosis present

## 2021-11-08 DIAGNOSIS — Z882 Allergy status to sulfonamides status: Secondary | ICD-10-CM

## 2021-11-08 DIAGNOSIS — Z8673 Personal history of transient ischemic attack (TIA), and cerebral infarction without residual deficits: Secondary | ICD-10-CM

## 2021-11-08 DIAGNOSIS — M810 Age-related osteoporosis without current pathological fracture: Secondary | ICD-10-CM | POA: Diagnosis present

## 2021-11-08 DIAGNOSIS — M109 Gout, unspecified: Secondary | ICD-10-CM | POA: Diagnosis present

## 2021-11-08 DIAGNOSIS — I69351 Hemiplegia and hemiparesis following cerebral infarction affecting right dominant side: Secondary | ICD-10-CM

## 2021-11-08 DIAGNOSIS — L89520 Pressure ulcer of left ankle, unstageable: Secondary | ICD-10-CM | POA: Diagnosis present

## 2021-11-08 DIAGNOSIS — G8929 Other chronic pain: Secondary | ICD-10-CM | POA: Diagnosis present

## 2021-11-08 DIAGNOSIS — Z823 Family history of stroke: Secondary | ICD-10-CM

## 2021-11-08 DIAGNOSIS — Z7982 Long term (current) use of aspirin: Secondary | ICD-10-CM

## 2021-11-08 DIAGNOSIS — L89896 Pressure-induced deep tissue damage of other site: Secondary | ICD-10-CM | POA: Diagnosis present

## 2021-11-08 DIAGNOSIS — M4628 Osteomyelitis of vertebra, sacral and sacrococcygeal region: Principal | ICD-10-CM | POA: Diagnosis present

## 2021-11-08 DIAGNOSIS — Z888 Allergy status to other drugs, medicaments and biological substances status: Secondary | ICD-10-CM

## 2021-11-08 DIAGNOSIS — Z79899 Other long term (current) drug therapy: Secondary | ICD-10-CM

## 2021-11-08 DIAGNOSIS — L89154 Pressure ulcer of sacral region, stage 4: Secondary | ICD-10-CM | POA: Diagnosis present

## 2021-11-08 DIAGNOSIS — L89159 Pressure ulcer of sacral region, unspecified stage: Secondary | ICD-10-CM | POA: Diagnosis present

## 2021-11-08 DIAGNOSIS — M8609 Acute hematogenous osteomyelitis, multiple sites: Secondary | ICD-10-CM

## 2021-11-08 DIAGNOSIS — M869 Osteomyelitis, unspecified: Secondary | ICD-10-CM | POA: Diagnosis present

## 2021-11-08 DIAGNOSIS — Z7401 Bed confinement status: Secondary | ICD-10-CM

## 2021-11-08 DIAGNOSIS — E559 Vitamin D deficiency, unspecified: Secondary | ICD-10-CM | POA: Diagnosis present

## 2021-11-08 DIAGNOSIS — D649 Anemia, unspecified: Secondary | ICD-10-CM | POA: Diagnosis present

## 2021-11-08 LAB — BASIC METABOLIC PANEL
Anion gap: 5 (ref 5–15)
BUN: 28 mg/dL — ABNORMAL HIGH (ref 6–20)
CO2: 30 mmol/L (ref 22–32)
Calcium: 8.8 mg/dL — ABNORMAL LOW (ref 8.9–10.3)
Chloride: 103 mmol/L (ref 98–111)
Creatinine, Ser: 0.45 mg/dL (ref 0.44–1.00)
GFR, Estimated: >60 mL/min (ref >60)
Glucose, Bld: 120 mg/dL — ABNORMAL HIGH (ref 70–99)
Potassium: 3.9 mmol/L (ref 3.5–5.1)
Sodium: 138 mmol/L (ref 135–145)

## 2021-11-08 LAB — CBC WITH DIFFERENTIAL/PLATELET
Abs Immature Granulocytes: 0.03 10*3/uL (ref 0.00–0.07)
Basophils Absolute: 0 10*3/uL (ref 0.0–0.1)
Basophils Relative: 0 %
Eosinophils Absolute: 0.3 10*3/uL (ref 0.0–0.5)
Eosinophils Relative: 3 %
HCT: 32.8 % — ABNORMAL LOW (ref 36.0–46.0)
Hemoglobin: 10.5 g/dL — ABNORMAL LOW (ref 12.0–15.0)
Immature Granulocytes: 0 %
Lymphocytes Relative: 25 %
Lymphs Abs: 2.4 10*3/uL (ref 0.7–4.0)
MCH: 28.5 pg (ref 26.0–34.0)
MCHC: 32 g/dL (ref 30.0–36.0)
MCV: 89.1 fL (ref 80.0–100.0)
Monocytes Absolute: 1 10*3/uL (ref 0.1–1.0)
Monocytes Relative: 11 %
Neutro Abs: 5.8 10*3/uL (ref 1.7–7.7)
Neutrophils Relative %: 61 %
Platelets: 315 10*3/uL (ref 150–400)
RBC: 3.68 MIL/uL — ABNORMAL LOW (ref 3.87–5.11)
RDW: 14.9 % (ref 11.5–15.5)
WBC: 9.6 10*3/uL (ref 4.0–10.5)
nRBC: 0 % (ref 0.0–0.2)

## 2021-11-08 LAB — LACTIC ACID, PLASMA: Lactic Acid, Venous: 1 mmol/L (ref 0.5–1.9)

## 2021-11-08 MED ORDER — VANCOMYCIN HCL IN DEXTROSE 1-5 GM/200ML-% IV SOLN
1000.0000 mg | Freq: Once | INTRAVENOUS | Status: AC
  Administered 2021-11-08: 1000 mg via INTRAVENOUS
  Filled 2021-11-08: qty 200

## 2021-11-08 MED ORDER — KETOROLAC TROMETHAMINE 15 MG/ML IJ SOLN
15.0000 mg | Freq: Once | INTRAMUSCULAR | Status: AC
  Administered 2021-11-08: 15 mg via INTRAVENOUS
  Filled 2021-11-08: qty 1

## 2021-11-08 MED ORDER — IOHEXOL 300 MG/ML  SOLN
100.0000 mL | Freq: Once | INTRAMUSCULAR | Status: AC | PRN
  Administered 2021-11-08: 100 mL via INTRAVENOUS

## 2021-11-08 MED ORDER — SODIUM CHLORIDE 0.9 % IV SOLN
2.0000 g | Freq: Once | INTRAVENOUS | Status: AC
  Administered 2021-11-08: 2 g via INTRAVENOUS
  Filled 2021-11-08: qty 20

## 2021-11-08 MED ORDER — FENTANYL CITRATE PF 50 MCG/ML IJ SOSY
25.0000 ug | PREFILLED_SYRINGE | Freq: Once | INTRAMUSCULAR | Status: AC
  Administered 2021-11-08: 25 ug via INTRAVENOUS
  Filled 2021-11-08: qty 1

## 2021-11-08 MED ORDER — VANCOMYCIN HCL 1250 MG/250ML IV SOLN
1250.0000 mg | INTRAVENOUS | Status: DC
  Administered 2021-11-10 (×2): 1250 mg via INTRAVENOUS
  Filled 2021-11-08 (×2): qty 250

## 2021-11-08 NOTE — Progress Notes (Addendum)
Pharmacy Antibiotic Note ? ?[name] [name] is a 61 y.o. female admitted on 11/08/2021 with wound on left heel and decubitus ulcer with malodorous drainage.  Pharmacy has been consulted to dose vancomycin for osteomyelitis ? ?Plan: ?Vancomycin 1gm IV x 1 then 1250mg  q24h (AUC 504.8, used Scr 0.8, TBW) ?Follow renal function, cultures and clinical course ? ?Height: 5\' 5"  (165.1 cm) ?Weight: 56.7 kg (125 lb) ?IBW/kg (Calculated) : 57 ? ?Temp (24hrs), Avg:98.3 ?F (36.8 ?C), Min:98.3 ?F (36.8 ?C), Max:98.3 ?F (36.8 ?C) ? ?Recent Labs  ?Lab 11/08/21 ?1949  ?WBC 9.6  ?CREATININE 0.45  ?LATICACIDVEN 1.0  ?  ?Estimated Creatinine Clearance: 67.8 mL/min (by C-G formula based on SCr of 0.45 mg/dL).   ? ?Allergies  ?Allergen Reactions  ? Ativan [Lorazepam]   ? Bactrim [Sulfamethoxazole-Trimethoprim]   ?  Not On MAR  ? Paxil [Paroxetine Hcl]   ?  Not on MAR  ? ? ?Antimicrobials this admission: ?5/16 CTX x 1 ?5/16 vanc >> ? ?Dose adjustments this admission: ? ? ?Microbiology results: ?5/16 BCx:  ? ?Thank you for allowing pharmacy to be a part of this patient?s care. ? ?6/16 RPh ?11/08/2021, 10:51 PM ? ?Pharmacy consulted to dose cefepime for osteomyelitis: ? ?Cefepime 2gm IV q8h ?Follow renal function and clinical course ? ?Arley Phenix RPh ?11/09/2021, 2:51 AM ? ? ? ?

## 2021-11-08 NOTE — ED Triage Notes (Addendum)
Pt BIB EMS  from [facility] pt c/o bilateral lower leg pain and lower back pain. Denies injury or trauma to legs or back. Pt has wounds on left lower leg and right heel. Pt states wound care sees her at [facility], saw her this morning.   ?

## 2021-11-08 NOTE — ED Notes (Signed)
XR at bedside

## 2021-11-08 NOTE — ED Provider Notes (Signed)
Care assumed from [facility], PA-C, at shift change, please see their notes for full documentation of patient's complaint/HPI. Briefly, pt here with complaint of bilateral leg pain and lower back pain - decubitus ulcer noted to sacrum and left heel. Results so far show reassuring lab work - normal WBC and lactic acid at 1.0. Negative xray of L foot however CT scan with findings concerning for osteomyelitis. Plan is to admit.  ? ?Physical Exam  ?BP 110/66   Pulse 93   Temp 98.3 ?F (36.8 ?C) (Oral)   Resp 17   Ht 5\' 5"  (1.651 m)   Wt 56.7 kg   SpO2 98%   BMI 20.80 kg/m?  ? ?Physical Exam ?Vitals and nursing note reviewed.  ?Constitutional:   ?   Appearance: She is not ill-appearing.  ?HENT:  ?   Head: Normocephalic and atraumatic.  ?Eyes:  ?   Conjunctiva/sclera: Conjunctivae normal.  ?Cardiovascular:  ?   Rate and Rhythm: Normal rate and regular rhythm.  ?Pulmonary:  ?   Effort: Pulmonary effort is normal.  ?   Breath sounds: Normal breath sounds.  ?Skin: ?   General: Skin is warm and dry.  ?   Coloration: Skin is not jaundiced.  ?Neurological:  ?   Mental Status: She is alert.  ? ? ?Procedures  ?Procedures ? ?ED Course / MDM  ? ?  ? ?Medical Decision Making ?Pt to be started on Vanc and Rocephin for osteomyelitis. Will call for admission at this time. VSS and no WBC currently to warrant 30 cc/kg fluid bolus.  ? ?Discussed case with [facility] Dr. Hal Hope who agrees to evaluate patient for admission.  ? ?Amount and/or Complexity of Data Reviewed ?Labs: ordered. ?Radiology: ordered. ? ?Risk ?Prescription drug management. ? ? ? ? ? ? ? ?  ?Eustaquio Maize, PA-C ?11/08/21 2247 ? ?  ?Ezequiel Essex, MD ?11/09/21 0116 ? ?

## 2021-11-08 NOTE — ED Provider Notes (Signed)
?[facility]-EMERGENCY DEPT ?Provider Note ? ? ?CSN: [id number] ?Arrival date & time: 11/08/21  1701 ? ?  ? ?History ? ?Chief Complaint  ?Patient presents with  ? Leg Pain  ? ? ?[name] [name] is a 61 y.o. female who presents to the emergency department brought in by EMS from a [facility] with concerns for bilateral lower leg pain onset several weeks.  She is evaluated by wound care and notes that they saw her today for her symptoms.  She denies injury or trauma.  Has been taking ibuprofen for pain.  Denies fever, chills, nausea, vomiting. ? ? ?The history is provided by the patient. No language interpreter was used.  ? ?  ? ?Home Medications ?Prior to Admission medications   ?Medication Sig Start Date End Date Taking? Authorizing Provider  ?acetaminophen (TYLENOL) 325 MG tablet Take 650 mg by mouth every 6 (six) hours as needed for mild pain or fever.     [provider]  ?allopurinol (ZYLOPRIM) 100 MG tablet Take 100 mg by mouth daily.    [provider]  ?aspirin 81 MG tablet Take 81 mg by mouth daily.    [provider]  ?baclofen (LIORESAL) 20 MG tablet Take 20 mg by mouth every 6 (six) hours as needed (chronic pain).     [provider]  ?benzonatate (TESSALON) 100 MG capsule Take 100 mg by mouth every 8 (eight) hours as needed for cough.    [provider]  ?Calcium Carbonate-Vitamin D3 (CALCIUM 600-D) 600-400 MG-UNIT TABS Take 1 tablet by mouth 2 (two) times daily.    [provider]  ?cetirizine (ZYRTEC) 10 MG tablet Take 10 mg by mouth daily.    [provider]  ?cycloSPORINE (RESTASIS) 0.05 % ophthalmic emulsion Place 1 drop into both eyes 2 (two) times daily.     [provider]  ?DULoxetine (CYMBALTA) 60 MG capsule Take 60 mg by mouth daily.    [provider]  ?fluticasone (FLONASE) 50 MCG/ACT nasal spray Place 1 spray into both nostrils at bedtime.    [provider]  ?gabapentin  (NEURONTIN) 300 MG capsule Take 300 mg by mouth 3 (three) times daily.    [provider]  ?Hypromellose (ARTIFICIAL TEARS) 0.4 % SOLN Place 2 drops into both eyes every 4 (four) hours as needed (dry eyes).    [provider]  ?Melatonin 5 MG TABS Take 5 mg by mouth at bedtime.    [provider]  ?methenamine (MANDELAMINE) 1 g tablet Take 1,000 mg by mouth 2 (two) times daily.    [provider]  ?metroNIDAZOLE (METROGEL) 1 % gel Apply 1 application topically daily.    [provider]  ?nitrofurantoin, macrocrystal-monohydrate, (MACROBID) 100 MG capsule Take 100 mg by mouth at bedtime.    [provider]  ?ondansetron (ZOFRAN-ODT) 8 MG disintegrating tablet Take 1 tablet (8 mg total) by mouth every 8 (eight) hours as needed. 06/24/21   [name], MD  ?Oxycodone HCl 10 MG TABS Take 10 mg by mouth every 6 (six) hours as needed (pain).    [provider]  ?polyethylene glycol (MIRALAX / GLYCOLAX) packet Take 17 g by mouth 2 (two) times daily. 11/27/17   [name], MD  ?rivaroxaban (XARELTO) 2.5 MG TABS tablet Take 2.5 mg by mouth every evening.    [provider]  ?senna-docusate (SENOKOT-S) 8.6-50 MG tablet Take 1 tablet by mouth 2 (two) times daily. 11/27/17   Andreas Newport  J, MD  ?thiamine 100 MG tablet Take 1 tablet (100 mg total) by mouth daily. 11/28/17   Alma Friendly, MD  ?tiZANidine (ZANAFLEX) 4 MG tablet Take 4 mg by mouth every 8 (eight) hours as needed for muscle spasms.     [provider]  ?   ? ?Allergies    ?Ativan [lorazepam], Bactrim [sulfamethoxazole-trimethoprim], and Paxil [paroxetine hcl]   ? ?Review of Systems   ?Review of Systems  ?Constitutional:  Negative for chills and fever.  ?Gastrointestinal:  Negative for nausea and vomiting.  ?Skin:  Positive for wound.  ?All other systems reviewed and are negative. ? ?Physical Exam ?Updated Vital Signs ?BP 92/63   Pulse 74   Temp 98.3 ?F (36.8 ?C)  (Oral)   Resp 17   Ht 5\' 5"  (1.651 m)   Wt 56.7 kg   SpO2 95%   BMI 20.80 kg/m?  ?Physical Exam ?Vitals and nursing note reviewed.  ?Constitutional:   ?   General: She is not in acute distress. ?   Appearance: She is not diaphoretic.  ?   Comments: Patient contracted at baseline.  ?HENT:  ?   Head: Normocephalic and atraumatic.  ?   Mouth/Throat:  ?   Pharynx: No oropharyngeal exudate.  ?Eyes:  ?   General: No scleral icterus. ?   Conjunctiva/sclera: Conjunctivae normal.  ?Cardiovascular:  ?   Rate and Rhythm: Normal rate and regular rhythm.  ?   Pulses: Normal pulses.  ?   Heart sounds: Normal heart sounds.  ?Pulmonary:  ?   Effort: Pulmonary effort is normal. No respiratory distress.  ?   Breath sounds: Normal breath sounds. No wheezing.  ?Abdominal:  ?   General: Bowel sounds are normal.  ?   Palpations: Abdomen is soft. There is no mass.  ?   Tenderness: There is no abdominal tenderness. There is no guarding or rebound.  ?Musculoskeletal:     ?   General: Normal range of motion.  ?   Cervical back: Normal range of motion and neck supple.  ?Skin: ?   General: Skin is warm and dry.  ?   Comments: Decubitus ulcer noted with malodorous drainage appreciated tenderness to palpation noted to the area.  No surrounding erythema noted.  Wound noted to left gluteus without drainage or surrounding erythema.  Mild tenderness to palpation noted to the area.  Wound noted to enter left heel with mild draining and mild tenderness to palpation to the area.  See pictures below.  ?Neurological:  ?   Mental Status: She is alert.  ?Psychiatric:     ?   Behavior: Behavior normal.  ? ? ? ? ? ?^^^ wound to left gluteus ? ? ? ? ?^^ Decubitus ulcer ? ? ? ?^left heel ?ED Results / Procedures / Treatments   ?Labs ?(all labs ordered are listed, but only abnormal results are displayed) ?Labs Reviewed  ?BASIC METABOLIC PANEL - Abnormal; Notable for the following components:  ?    Result Value  ? Glucose, Bld 120 (*)   ? BUN 28 (*)   ?  Calcium 8.8 (*)   ? All other components within normal limits  ?CBC WITH DIFFERENTIAL/PLATELET - Abnormal; Notable for the following components:  ? RBC 3.68 (*)   ? Hemoglobin 10.5 (*)   ? HCT 32.8 (*)   ? All other components within normal limits  ?CULTURE, BLOOD (ROUTINE X 2)  ?CULTURE, BLOOD (ROUTINE X 2)  ?LACTIC ACID, PLASMA  ?URINALYSIS, ROUTINE  W REFLEX MICROSCOPIC  ? ? ?EKG ?None ? ?Radiology ?CT ABDOMEN PELVIS W CONTRAST ? ?Result Date: 11/08/2021 ?CLINICAL DATA:  Decubitus ulcer. EXAM: CT ABDOMEN AND PELVIS WITH CONTRAST TECHNIQUE: Multidetector CT imaging of the abdomen and pelvis was performed using the standard protocol following bolus administration of intravenous contrast. RADIATION DOSE REDUCTION: This exam was performed according to the departmental dose-optimization program which includes automated exposure control, adjustment of the mA and/or kV according to patient size and/or use of iterative reconstruction technique. CONTRAST:  146mL OMNIPAQUE IOHEXOL 300 MG/ML  SOLN COMPARISON:  06/24/2021 FINDINGS: Lower Chest: No acute findings. Hepatobiliary:  No hepatic masses identified. Pancreas:  No mass or inflammatory changes. Spleen: Within normal limits in size and appearance. Adrenals/Urinary Tract: No masses identified. A few tiny benign renal cysts are noted bilaterally (no followup imaging is recommended). A 2 mm calculus is noted in the midpole the right kidney. No evidence of ureteral calculi or hydronephrosis. Bladder is distended. Lower portion of bladder is not visualized due to severe artifact from bilateral hip prostheses, however at least 2 calculi are seen along the left lateral bladder wall, largest measuring 6 mm. Stomach/Bowel: No evidence of obstruction, inflammatory process or abnormal fluid collections. Vascular/Lymphatic: No pathologically enlarged lymph nodes. No acute vascular findings. Aortic atherosclerotic calcification incidentally noted. Reproductive:  No mass or other  significant abnormality. Other:  None. Musculoskeletal: A decubitus ulcer is seen overlying the distal sacrum and coccyx. No evidence of soft tissue abscess. 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

## 2021-11-09 ENCOUNTER — Observation Stay (HOSPITAL_COMMUNITY): Payer: Medicaid Other

## 2021-11-09 ENCOUNTER — Encounter (HOSPITAL_COMMUNITY): Payer: Self-pay | Admitting: Internal Medicine

## 2021-11-09 DIAGNOSIS — M109 Gout, unspecified: Secondary | ICD-10-CM | POA: Diagnosis present

## 2021-11-09 DIAGNOSIS — G8929 Other chronic pain: Secondary | ICD-10-CM | POA: Diagnosis present

## 2021-11-09 DIAGNOSIS — L0291 Cutaneous abscess, unspecified: Secondary | ICD-10-CM | POA: Diagnosis not present

## 2021-11-09 DIAGNOSIS — M4628 Osteomyelitis of vertebra, sacral and sacrococcygeal region: Secondary | ICD-10-CM | POA: Diagnosis present

## 2021-11-09 DIAGNOSIS — L89159 Pressure ulcer of sacral region, unspecified stage: Secondary | ICD-10-CM | POA: Diagnosis present

## 2021-11-09 DIAGNOSIS — M810 Age-related osteoporosis without current pathological fracture: Secondary | ICD-10-CM | POA: Diagnosis present

## 2021-11-09 DIAGNOSIS — M869 Osteomyelitis, unspecified: Secondary | ICD-10-CM | POA: Diagnosis not present

## 2021-11-09 DIAGNOSIS — Z882 Allergy status to sulfonamides status: Secondary | ICD-10-CM | POA: Diagnosis not present

## 2021-11-09 DIAGNOSIS — Z8673 Personal history of transient ischemic attack (TIA), and cerebral infarction without residual deficits: Secondary | ICD-10-CM | POA: Diagnosis not present

## 2021-11-09 DIAGNOSIS — Z7982 Long term (current) use of aspirin: Secondary | ICD-10-CM | POA: Diagnosis not present

## 2021-11-09 DIAGNOSIS — Z6820 Body mass index (BMI) 20.0-20.9, adult: Secondary | ICD-10-CM | POA: Diagnosis not present

## 2021-11-09 DIAGNOSIS — M8609 Acute hematogenous osteomyelitis, multiple sites: Secondary | ICD-10-CM | POA: Diagnosis not present

## 2021-11-09 DIAGNOSIS — L89896 Pressure-induced deep tissue damage of other site: Secondary | ICD-10-CM | POA: Diagnosis present

## 2021-11-09 DIAGNOSIS — F319 Bipolar disorder, unspecified: Secondary | ICD-10-CM | POA: Diagnosis present

## 2021-11-09 DIAGNOSIS — L89154 Pressure ulcer of sacral region, stage 4: Secondary | ICD-10-CM | POA: Diagnosis present

## 2021-11-09 DIAGNOSIS — L89520 Pressure ulcer of left ankle, unstageable: Secondary | ICD-10-CM | POA: Diagnosis present

## 2021-11-09 DIAGNOSIS — M79605 Pain in left leg: Secondary | ICD-10-CM

## 2021-11-09 DIAGNOSIS — M79604 Pain in right leg: Secondary | ICD-10-CM | POA: Diagnosis not present

## 2021-11-09 DIAGNOSIS — E559 Vitamin D deficiency, unspecified: Secondary | ICD-10-CM | POA: Diagnosis present

## 2021-11-09 DIAGNOSIS — D649 Anemia, unspecified: Secondary | ICD-10-CM | POA: Diagnosis present

## 2021-11-09 DIAGNOSIS — Z823 Family history of stroke: Secondary | ICD-10-CM | POA: Diagnosis not present

## 2021-11-09 DIAGNOSIS — Z86711 Personal history of pulmonary embolism: Secondary | ICD-10-CM | POA: Diagnosis not present

## 2021-11-09 DIAGNOSIS — Z7901 Long term (current) use of anticoagulants: Secondary | ICD-10-CM | POA: Diagnosis not present

## 2021-11-09 DIAGNOSIS — Z8249 Family history of ischemic heart disease and other diseases of the circulatory system: Secondary | ICD-10-CM | POA: Diagnosis not present

## 2021-11-09 DIAGNOSIS — Z888 Allergy status to other drugs, medicaments and biological substances status: Secondary | ICD-10-CM | POA: Diagnosis not present

## 2021-11-09 DIAGNOSIS — Z79899 Other long term (current) drug therapy: Secondary | ICD-10-CM | POA: Diagnosis not present

## 2021-11-09 DIAGNOSIS — E440 Moderate protein-calorie malnutrition: Secondary | ICD-10-CM | POA: Diagnosis present

## 2021-11-09 DIAGNOSIS — Z7401 Bed confinement status: Secondary | ICD-10-CM | POA: Diagnosis not present

## 2021-11-09 DIAGNOSIS — Z66 Do not resuscitate: Secondary | ICD-10-CM | POA: Diagnosis present

## 2021-11-09 DIAGNOSIS — I69351 Hemiplegia and hemiparesis following cerebral infarction affecting right dominant side: Secondary | ICD-10-CM | POA: Diagnosis not present

## 2021-11-09 LAB — COMPREHENSIVE METABOLIC PANEL
ALT: 14 U/L (ref 0–44)
AST: 13 U/L — ABNORMAL LOW (ref 15–41)
Albumin: 2.5 g/dL — ABNORMAL LOW (ref 3.5–5.0)
Alkaline Phosphatase: 51 U/L (ref 38–126)
Anion gap: 7 (ref 5–15)
BUN: 26 mg/dL — ABNORMAL HIGH (ref 6–20)
CO2: 28 mmol/L (ref 22–32)
Calcium: 8.5 mg/dL — ABNORMAL LOW (ref 8.9–10.3)
Chloride: 104 mmol/L (ref 98–111)
Creatinine, Ser: 0.4 mg/dL — ABNORMAL LOW (ref 0.44–1.00)
GFR, Estimated: >60 mL/min (ref >60)
Glucose, Bld: 112 mg/dL — ABNORMAL HIGH (ref 70–99)
Potassium: 3.6 mmol/L (ref 3.5–5.1)
Sodium: 139 mmol/L (ref 135–145)
Total Bilirubin: 0.4 mg/dL (ref 0.3–1.2)
Total Protein: 6.1 g/dL — ABNORMAL LOW (ref 6.5–8.1)

## 2021-11-09 LAB — MRSA NEXT GEN BY PCR, NASAL: MRSA by PCR Next Gen: DETECTED — AB

## 2021-11-09 LAB — CBC WITH DIFFERENTIAL/PLATELET
Abs Immature Granulocytes: 0.03 10*3/uL (ref 0.00–0.07)
Basophils Absolute: 0 10*3/uL (ref 0.0–0.1)
Basophils Relative: 0 %
Eosinophils Absolute: 0.3 10*3/uL (ref 0.0–0.5)
Eosinophils Relative: 3 %
HCT: 34.3 % — ABNORMAL LOW (ref 36.0–46.0)
Hemoglobin: 10.6 g/dL — ABNORMAL LOW (ref 12.0–15.0)
Immature Granulocytes: 0 %
Lymphocytes Relative: 26 %
Lymphs Abs: 2.3 10*3/uL (ref 0.7–4.0)
MCH: 27.6 pg (ref 26.0–34.0)
MCHC: 30.9 g/dL (ref 30.0–36.0)
MCV: 89.3 fL (ref 80.0–100.0)
Monocytes Absolute: 1.1 10*3/uL — ABNORMAL HIGH (ref 0.1–1.0)
Monocytes Relative: 13 %
Neutro Abs: 5.2 10*3/uL (ref 1.7–7.7)
Neutrophils Relative %: 58 %
Platelets: 322 10*3/uL (ref 150–400)
RBC: 3.84 MIL/uL — ABNORMAL LOW (ref 3.87–5.11)
RDW: 15.1 % (ref 11.5–15.5)
WBC: 8.9 10*3/uL (ref 4.0–10.5)
nRBC: 0 % (ref 0.0–0.2)

## 2021-11-09 LAB — URINALYSIS, ROUTINE W REFLEX MICROSCOPIC
Bilirubin Urine: NEGATIVE
Glucose, UA: NEGATIVE mg/dL
Hgb urine dipstick: NEGATIVE
Ketones, ur: 5 mg/dL — AB
Nitrite: POSITIVE — AB
Protein, ur: NEGATIVE mg/dL
Specific Gravity, Urine: 1.031 — ABNORMAL HIGH (ref 1.005–1.030)
pH: 6 (ref 5.0–8.0)

## 2021-11-09 LAB — HIV ANTIBODY (ROUTINE TESTING W REFLEX): HIV Screen 4th Generation wRfx: NONREACTIVE

## 2021-11-09 MED ORDER — MAGNESIUM OXIDE -MG SUPPLEMENT 400 (240 MG) MG PO TABS
400.0000 mg | ORAL_TABLET | Freq: Every day | ORAL | Status: DC
  Administered 2021-11-09 – 2021-11-13 (×5): 400 mg via ORAL
  Filled 2021-11-09 (×5): qty 1

## 2021-11-09 MED ORDER — MORPHINE SULFATE (PF) 2 MG/ML IV SOLN
1.0000 mg | INTRAVENOUS | Status: DC | PRN
Start: 2021-11-09 — End: 2021-11-09
  Administered 2021-11-09 (×3): 1 mg via INTRAVENOUS
  Filled 2021-11-09 (×3): qty 1

## 2021-11-09 MED ORDER — GABAPENTIN 300 MG PO CAPS
600.0000 mg | ORAL_CAPSULE | Freq: Three times a day (TID) | ORAL | Status: DC
  Administered 2021-11-09 – 2021-11-13 (×12): 600 mg via ORAL
  Filled 2021-11-09 (×14): qty 2

## 2021-11-09 MED ORDER — POLYVINYL ALCOHOL 1.4 % OP SOLN: 2.0000 [drp] | OPHTHALMIC | Status: DC | PRN

## 2021-11-09 MED ORDER — ENSURE ENLIVE PO LIQD
237.0000 mL | Freq: Two times a day (BID) | ORAL | Status: DC
  Administered 2021-11-09 – 2021-11-13 (×7): 237 mL via ORAL

## 2021-11-09 MED ORDER — ALLOPURINOL 100 MG PO TABS
100.0000 mg | ORAL_TABLET | Freq: Every day | ORAL | Status: DC
Start: 2021-11-09 — End: 2021-11-13
  Administered 2021-11-09 – 2021-11-13 (×5): 100 mg via ORAL
  Filled 2021-11-09 (×5): qty 1

## 2021-11-09 MED ORDER — ASPIRIN 81 MG PO CHEW
81.0000 mg | CHEWABLE_TABLET | Freq: Every day | ORAL | Status: DC
  Administered 2021-11-09 – 2021-11-13 (×5): 81 mg via ORAL
  Filled 2021-11-09 (×5): qty 1

## 2021-11-09 MED ORDER — MORPHINE SULFATE (PF) 2 MG/ML IV SOLN
2.0000 mg | INTRAVENOUS | Status: DC | PRN
  Administered 2021-11-09 – 2021-11-13 (×9): 2 mg via INTRAVENOUS
  Filled 2021-11-09 (×10): qty 1

## 2021-11-09 MED ORDER — CYCLOSPORINE 0.05 % OP EMUL
1.0000 [drp] | Freq: Two times a day (BID) | OPHTHALMIC | Status: DC
  Administered 2021-11-09 – 2021-11-13 (×9): 1 [drp] via OPHTHALMIC
  Filled 2021-11-09 (×9): qty 30

## 2021-11-09 MED ORDER — CHLORHEXIDINE GLUCONATE CLOTH 2 % EX PADS
6.0000 | MEDICATED_PAD | Freq: Every day | CUTANEOUS | Status: AC
  Administered 2021-11-09 – 2021-11-13 (×5): 6 via TOPICAL

## 2021-11-09 MED ORDER — MUPIROCIN 2 % EX OINT
1.0000 "application " | TOPICAL_OINTMENT | Freq: Two times a day (BID) | CUTANEOUS | Status: DC
  Administered 2021-11-09 – 2021-11-13 (×9): 1 via NASAL
  Filled 2021-11-09 (×3): qty 22

## 2021-11-09 MED ORDER — MIRTAZAPINE 15 MG PO TABS
7.5000 mg | ORAL_TABLET | Freq: Every day | ORAL | Status: DC
  Administered 2021-11-09 – 2021-11-12 (×4): 7.5 mg via ORAL
  Filled 2021-11-09 (×4): qty 1

## 2021-11-09 MED ORDER — ACETAMINOPHEN 650 MG RE SUPP: 650.0000 mg | Freq: Four times a day (QID) | RECTAL | Status: DC | PRN

## 2021-11-09 MED ORDER — TIZANIDINE HCL 4 MG PO TABS
4.0000 mg | ORAL_TABLET | Freq: Two times a day (BID) | ORAL | Status: DC
Start: 2021-11-09 — End: 2021-11-13
  Administered 2021-11-09 – 2021-11-13 (×10): 4 mg via ORAL
  Filled 2021-11-09 (×10): qty 1

## 2021-11-09 MED ORDER — ASCORBIC ACID 500 MG PO TABS
500.0000 mg | ORAL_TABLET | Freq: Every day | ORAL | Status: DC
  Administered 2021-11-09 – 2021-11-13 (×5): 500 mg via ORAL
  Filled 2021-11-09 (×5): qty 1

## 2021-11-09 MED ORDER — SODIUM CHLORIDE 0.9 % IV SOLN
2.0000 g | Freq: Three times a day (TID) | INTRAVENOUS | Status: DC
  Administered 2021-11-09 – 2021-11-13 (×13): 2 g via INTRAVENOUS
  Filled 2021-11-09 (×14): qty 12.5

## 2021-11-09 MED ORDER — PROSOURCE PLUS PO LIQD
30.0000 mL | Freq: Two times a day (BID) | ORAL | Status: DC
  Administered 2021-11-09 – 2021-11-13 (×9): 30 mL via ORAL
  Filled 2021-11-09 (×7): qty 30

## 2021-11-09 MED ORDER — ACETAMINOPHEN 325 MG PO TABS
650.0000 mg | ORAL_TABLET | Freq: Four times a day (QID) | ORAL | Status: DC | PRN
  Administered 2021-11-10 – 2021-11-13 (×3): 650 mg via ORAL
  Filled 2021-11-09 (×3): qty 2

## 2021-11-09 MED ORDER — FLUOXETINE HCL 20 MG PO CAPS
40.0000 mg | ORAL_CAPSULE | Freq: Every day | ORAL | Status: DC
  Administered 2021-11-09 – 2021-11-13 (×5): 40 mg via ORAL
  Filled 2021-11-09 (×5): qty 2

## 2021-11-09 MED ORDER — MELATONIN 3 MG PO TABS: 3.0000 mg | ORAL_TABLET | Freq: Every evening | ORAL | Status: DC | PRN

## 2021-11-09 MED ORDER — TIZANIDINE HCL 4 MG PO TABS
2.0000 mg | ORAL_TABLET | Freq: Every day | ORAL | Status: DC
  Administered 2021-11-09 – 2021-11-12 (×4): 2 mg via ORAL
  Filled 2021-11-09 (×4): qty 1

## 2021-11-09 MED ORDER — PRO-STAT SUGAR FREE PO LIQD
Freq: Two times a day (BID) | ORAL | Status: DC
  Filled 2021-11-09: qty 30

## 2021-11-09 MED ORDER — ENOXAPARIN SODIUM 40 MG/0.4ML IJ SOSY
40.0000 mg | PREFILLED_SYRINGE | INTRAMUSCULAR | Status: DC
  Administered 2021-11-09 – 2021-11-13 (×5): 40 mg via SUBCUTANEOUS
  Filled 2021-11-09 (×5): qty 0.4

## 2021-11-09 MED ORDER — OYSTER SHELL CALCIUM/D3 500-5 MG-MCG PO TABS
1.0000 | ORAL_TABLET | Freq: Two times a day (BID) | ORAL | Status: DC
  Administered 2021-11-09 – 2021-11-13 (×10): 1 via ORAL
  Filled 2021-11-09 (×10): qty 1

## 2021-11-09 NOTE — Progress Notes (Signed)
Dr. Toniann Fail at bedside, and made aware of Pt. Needing pain medication.  ?

## 2021-11-09 NOTE — H&P (Signed)
?History and Physical  ? ? ?[name] ZSM:[id number] DOB: 06-Apr-1961 DOA: 11/08/2021 ? ?PCP: Pcp, No  ?Patient coming from: Skilled nursing facility. ? ?Chief Complaint: Pain. ? ?HPI: [name] [name] is a 61 y.o. female with history of CVA with right-sided hemiplegia largely bedbound presents to the ER patient having worsening back pain and pain rating to both lower extremities for the last few days.  Patient has stage IV sacral decubitus which has been found to have increasing drainage. ? ?ED Course: In the ER patient is afebrile.  Sacral ligament is also has drainage.  Patient was complaining of pain in the sacral area also radiating to both legs.  Has good peripheral pulses.  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  Lab work show anemia. ? ?Review of Systems: As per HPI, rest all negative. ? ? ?Past Medical History:  ?Diagnosis Date  ? Anxiety   ? Bipolar disorder (HCC)   ? Chronic pain   ? CVA (cerebral vascular accident) [facility]) Jul 22, 1999  ? right side  ? Depression   ? Gout   ? Osteoporosis   ? Pap smear for cervical cancer screening   ? 5+ years ago normal  ? Pulmonary embolism (HCC)   ? bilateral  ? Tobacco abuse   ? Vitamin D deficiency   ? ? ?Past Surgical History:  ?Procedure Laterality Date  ? APPENDECTOMY    ? PARTIAL HIP ARTHROPLASTY Right October 2001  ? TOTAL HIP ARTHROPLASTY Left June 2000  ? ? ? reports that she has been smoking cigarettes. She has been smoking an average of .5 packs per day. She has never used smokeless tobacco. She reports current alcohol use. She reports current drug use. Drug: Marijuana. ? ?Allergies  ?Allergen Reactions  ? Ativan [Lorazepam]   ? Bactrim [Sulfamethoxazole-Trimethoprim]   ?  Not On MAR  ? Paxil [Paroxetine Hcl]   ?  Not on MAR  ? ? ?Family History  ?Problem Relation Age of Onset  ? Stroke Mother   ? Stroke Brother   ? Hypertension Brother   ? Heart murmur Brother    ? ? ?Prior to Admission medications   ?Medication Sig Start Date End Date Taking? Authorizing Provider  ?allopurinol (ZYLOPRIM) 100 MG tablet Take 100 mg by mouth daily.   Yes [provider]  ?Amino Acids-Protein Hydrolys (PRO-STAT PO) Take 30 mLs by mouth 2 (two) times daily.   Yes [provider]  ?aspirin 81 MG tablet Take 81 mg by mouth daily.   Yes [provider]  ?calcium-vitamin D (OSCAL WITH D) 500-5 MG-MCG tablet Take 1 tablet by mouth 2 (two) times daily.   Yes [provider]  ?cholecalciferol (VITAMIN D) 25 MCG (1000 UNIT) tablet Take 1,000 Units by mouth daily.   Yes [provider]  ?cycloSPORINE (RESTASIS) 0.05 % ophthalmic emulsion Place 1 drop into both eyes 2 (two) times daily.    Yes [provider]  ?diclofenac Sodium (VOLTAREN) 1 % GEL Apply 2 g topically every 6 (six) hours as needed (for pain).   Yes [provider]  ?FLUoxetine (PROZAC) 40 MG capsule Take 40 mg by mouth daily.   Yes [provider]  ?gabapentin (NEURONTIN) 600 MG tablet Take 600 mg by mouth every 8 (eight) hours.   Yes [provider]  ?ibuprofen (ADVIL) 400 MG tablet Take 800 mg by mouth every 6 (six) hours as  needed (for pain).   Yes [provider]  ?LACTOBACILLUS PO Take 1 capsule by mouth 2 (two) times daily.   Yes [provider]  ?loperamide (IMODIUM) 2 MG capsule Take 2 mg by mouth every 6 (six) hours as needed for diarrhea or loose stools.   Yes [provider]  ?magnesium oxide (MAG-OX) 400 MG tablet Take 400 mg by mouth daily.   Yes [provider]  ?melatonin 3 MG TABS tablet Take 3 mg by mouth at bedtime as needed (for insomnia).   Yes [provider]  ?mirtazapine (REMERON) 7.5 MG tablet Take 7.5 mg by mouth at bedtime.   Yes [provider]  ?tiZANidine (ZANAFLEX) 2 MG tablet Take 2 mg by mouth at bedtime. Then give with 4mg  tablet at bedtime for a total dose of 6mg  at  midnight   Yes [provider]  ?tiZANidine (ZANAFLEX) 4 MG tablet Take 4 mg by mouth 2 (two) times daily. Also give 4mg  by mouth along with 2mg  for a total dose of 6mg  at midnight   Yes [provider]  ?vitamin C (ASCORBIC ACID) 500 MG tablet Take 500 mg by mouth daily.   Yes [provider]  ?Zinc Oxide 10 % OINT Apply 1 application. topically in the morning and at bedtime. Apply to face topically every day and evening   Yes [provider]  ?acetaminophen (TYLENOL) 325 MG tablet Take 650 mg by mouth every 6 (six) hours as needed for mild pain or fever.     [provider]  ?cetirizine (ZYRTEC) 10 MG tablet Take 10 mg by mouth daily.    [provider]  ?fluticasone (FLONASE) 50 MCG/ACT nasal spray Place 1 spray into both nostrils at bedtime.    [provider]  ?Hypromellose (ARTIFICIAL TEARS) 0.4 % SOLN Place 2 drops into both eyes every 4 (four) hours as needed (dry eyes).    [provider]  ? ? ?Physical Exam: ?Constitutional: Moderately built and nourished. ?Vitals:  ? 11/08/21 2230 11/08/21 2300 11/09/21 0045 11/09/21 0048  ?BP: 110/66 117/72 122/84 122/84  ?Pulse: 93 97 (!) 107 (!) 107  ?Resp: 17 16  18   ?Temp:   98.4 ?F (36.9 ?C) 98.4 ?F (36.9 ?C)  ?TempSrc:   Oral Oral  ?SpO2: 98% 95% 100% 100%  ?Weight:      ?Height:      ? ?Eyes: Anicteric no pallor. ?ENMT: No discharge from the ears eyes nose and mouth. ?Neck: No mass felt.  No neck rigidity. ?Respiratory: No rhonchi or crepitations. ?Cardiovascular: S1-S2 heard. ?Abdomen: Soft nontender bowel sound present. ?Musculoskeletal: No edema.  Contractures of the right upper extremity also lower extremities. ?Skin: Stage IV sacral decubitus ulcer. ?Neurologic: Alert awake oriented time place and person.  Right-sided hemiplegia and contractures. ?Psychiatric: Appears normal.  Normal affect. ? ? ?Labs on Admission: I have personally reviewed following labs and imaging  studies ? ?CBC: ?Recent Labs  ?Lab 11/08/21 ?1949  ?WBC 9.6  ?NEUTROABS 5.8  ?HGB 10.5*  ?HCT 32.8*  ?MCV 89.1  ?PLT 315  ? ?Basic Metabolic Panel: ?Recent Labs  ?Lab 11/08/21 ?1949  ?NA 138  ?K 3.9  ?CL 103  ?CO2 30  ?GLUCOSE 120*  ?BUN 28*  ?CREATININE 0.45  ?CALCIUM 8.8*  ? ?GFR: ?Estimated Creatinine Clearance: 67.8 mL/min (by C-G formula based on SCr of 0.45 mg/dL). ?Liver Function Tests: ?No results for input(s): AST, ALT, ALKPHOS, BILITOT, PROT, ALBUMIN in the last 168 hours. ?No results for  input(s): LIPASE, AMYLASE in the last 168 hours. ?No results for input(s): AMMONIA in the last 168 hours. ?Coagulation Profile: ?No results for input(s): INR, PROTIME in the last 168 hours. ?Cardiac Enzymes: ?No results for input(s): CKTOTAL, CKMB, CKMBINDEX, TROPONINI in the last 168 hours. ?BNP (last 3 results) ?No results for input(s): PROBNP in the last 8760 hours. ?HbA1C: ?No results for input(s): HGBA1C in the last 72 hours. ?CBG: ?No results for input(s): GLUCAP in the last 168 hours. ?Lipid Profile: ?No results for input(s): CHOL, HDL, LDLCALC, TRIG, CHOLHDL, LDLDIRECT in the last 72 hours. ?Thyroid Function Tests: ?No results for input(s): TSH, T4TOTAL, FREET4, T3FREE, THYROIDAB in the last 72 hours. ?Anemia Panel: ?No results for input(s): VITAMINB12, FOLATE, FERRITIN, TIBC, IRON, RETICCTPCT in the last 72 hours. ?Urine analysis: ?   ?Component Value Date/Time  ? COLORURINE YELLOW 09/07/2021 1406  ? APPEARANCEUR CLOUDY (A) 09/07/2021 1406  ? APPEARANCEUR Hazy 11/26/2012 1642  ? LABSPEC >1.030 (H) 09/07/2021 1406  ? LABSPEC 1.024 11/26/2012 1642  ? PHURINE 6.0 09/07/2021 1406  ? GLUCOSEU NEGATIVE 09/07/2021 1406  ? GLUCOSEU Negative 11/26/2012 1642  ? HGBUR MODERATE (A) 09/07/2021 1406  ? BILIRUBINUR SMALL (A) 09/07/2021 1406  ? BILIRUBINUR Negative 11/26/2012 1642  ? KETONESUR 15 (A) 09/07/2021 1406  ? PROTEINUR 100 (A) 09/07/2021 1406  ? UROBILINOGEN negative 10/10/2012 1633  ? UROBILINOGEN 1.0 05/08/2008 1210   ? NITRITE POSITIVE (A) 09/07/2021 1406  ? LEUKOCYTESUR MODERATE (A) 09/07/2021 1406  ? LEUKOCYTESUR Negative 11/26/2012 1642  ? ?Sepsis Labs: ?@[email or web address] (procalcitonin:4,lacticidven:4) ?)No results found for this or any previous

## 2021-11-09 NOTE — Progress Notes (Signed)
Pt brought down to MRI. Pt is not currently a MRI candidate. Pt unable to lay on her back.. Pt moaning in pain and tearful. Unable to position pt to even slide her over to the MRI table. Pt in too much pain and unable to hold still.  ?

## 2021-11-09 NOTE — Consult Note (Signed)
WOC Nurse Consult Note: ?Reason for Consult:Pressure injuries to left ischial tuberosity, sacrum, left medial heel. Also to right medial malleolus. ?Turing and repositioning is complicated by severe contractures and pain. Right arm is unable to be moved at this encounter. ?Wound type:Pressure ?Pressure Injury POA: Yes ?Measurement: ?Sacrum: 3cm x 3cm x 3cm Stage 4 PI with circumferential undermining to 3.6cm. Red wound bed, foul odor exudate. ?Left ischial tuberosity: 3.5cm x 3cm area of purple/maroon discoloration with surrounding blanching erythema consistent with DTPI. This area is expected to worsen and reveal a full thickness ulceration if pressure redistribution cannot be accomplished in addition to the maximization of nutritional status ?Left medial malleolus: Unstageable PI measuring 2cm x 4cm with depth obscured by the presence of nonviable tissue.  ?Right medial malleolus: Stage 2 PI with ruptured blister measuring 4cm x 2cm x 0.1cm ?Wound bed:As noted above ?Drainage (amount, consistency, odor) As noted above ?Periwound:As noted above ?Dressing procedure/placement/frequency: ?I have implemented a mattress replacement with low air loss feature to mitigate pressure, HOB will need to be maintained at or below a 30 degree angle except while eating to minimize pressure on ITs. The heels are provided with pressure redistribution heel boots as well as topical care.  ? ?Topical care to the sacral and left medial malleolus wounds will be with a silver hydrofiber, the left IT and right medial LE wounds will be treated with xeroform topped with silicone foam.  ? ?Recommend consideration for orthopedics consultation to see if releasing the contracture on the RUE is an option. Recommend nutritional consult for despite the patient eating well on a regular diet, supplementation is likely required due to the extensive nature of the Pressure injuries. ? ?[place] nursing team will not follow, but will remain available to this  patient, the nursing and medical teams.  Please re-consult if needed. ?Thanks, ?Maudie Flakes, MSN, RN, [place], [place], [facility], [place]  ?Pager# [phone]  ? ? ?  ?

## 2021-11-09 NOTE — Progress Notes (Signed)
ABI has been completed. ? ? ?Results can be found under chart review under CV PROC. ?11/09/2021 4:10 PM ?Zareen Jamison RVT, RDMS ? ?

## 2021-11-09 NOTE — Progress Notes (Signed)
[facility] ?PROGRESS NOTE ? ? ? ?Progress Note  ?[name] [name]  [id number] DOB: Nov 29, 1960 DOA: 11/08/2021 ?PCP: Pcp, No  ? ? ? ?Brief Narrative:  ? ?[name] [name] is an 61 y.o. 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  Started on empiric antibiotics in the ED CT scan showed features concerning for osteomyelitis of the sacrum and coccyx ? ? ?Assessment/Plan:  ? ?Sacral osteomyelitis ([place]) ?Wound care has been consulted. ?She was started empirically on vancomycin and cefepime. ?Has remained afebrile blood cultures have been ordered. ?CT of the abdomen pelvis showed decubitus ulcer with osteomyelitis of the sacrum and coccyx no evidence of abscess. ?MRI is pending ? ?Bilateral lower extremity pain: ?MRI of the L-spine is pending. ?Radiating from the back likely due to his acute decubitus ulcer ? ?History of CVA: ?Continue aspirin. ? ?Normocytic anemia: ?Anemia panel will be unreliable. ?Hemoglobin is stable. ? ?History of chronic pain: ?Continue gabapentin IV morphine as needed. ? ?History of depression: ?Continue fluoxetine. ? ?Unstageable sacral decubitus ulcer pressure ulcer present on admission: ?RN Pressure Injury Documentation: ?Pressure Injury 11/09/21 Coccyx Unstageable - Full thickness tissue loss in which the base of the injury is covered by slough (yellow, tan, gray, green or brown) and/or eschar (tan, brown or black) in the wound bed. (Active)  ?11/09/21 0223  ?Location: Coccyx  ?Location Orientation:   ?Staging: Unstageable - Full thickness tissue loss in which the base of the injury is covered by slough (yellow, tan, gray, green or brown) and/or eschar (tan, brown or black) in the wound bed.  ?Wound Description (Comments):   ?Present on Admission: Yes  ?Dressing Type Foam - Lift  dressing to assess site every shift 11/09/21 0043  ?   ?Pressure Injury 11/09/21 Ankle Left;Medial Unstageable - Full thickness tissue loss in which the base of the injury is covered by slough (yellow, tan, gray, green or brown) and/or eschar (tan, brown or black) in the wound bed. (Active)  ?11/09/21 0226  ?Location: Ankle  ?Location Orientation: Left;Medial  ?Staging: Unstageable - Full thickness tissue loss in which the base of the injury is covered by slough (yellow, tan, gray, green or brown) and/or eschar (tan, brown or black) in the wound bed.  ?Wound Description (Comments):   ?Present on Admission: Yes  ?Dressing Type Foam - Lift dressing to assess site every shift 11/09/21 0227  ?   ?Pressure Injury 11/09/21 Ankle Left;Lateral Unstageable - Full thickness tissue loss in which the base of the injury is covered by slough (yellow, tan, gray, green or brown) and/or eschar (tan, brown or black) in the wound bed. (Active)  ?11/09/21 0226  ?Location: Ankle  ?Location Orientation: Left;Lateral  ?Staging: Unstageable - Full thickness tissue loss in which the base of the injury is covered by slough (yellow, tan, gray, green or brown) and/or eschar (tan, brown or black) in the wound bed.  ?Wound Description (Comments):   ?Present on Admission: Yes  ?Dressing Type Foam - Lift dressing to assess site every shift 11/09/21 0228  ?   ?Pressure Injury 11/09/21 Buttocks Left Unstageable - Full thickness tissue loss in which the base of the injury is covered by slough (yellow, tan, gray, green or brown) and/or eschar (tan, brown or black) in the wound bed. (Active)  ?11/09/21 0043  ?Location: Buttocks  ?Location  Orientation: Left  ?Staging: Unstageable - Full thickness tissue loss in which the base of the injury is covered by slough (yellow, tan, gray, green or brown) and/or eschar (tan, brown or black) in the wound bed.  ?Wound Description (Comments):   ?Present on Admission: Yes  ?Dressing Type Foam - Lift dressing to  assess site every shift 11/09/21 0043  ? ? ?Estimated body mass index is 20.8 kg/m? as calculated from the following: ?  Height as of this encounter: 5\' 5"  (1.651 m). ?  Weight as of this encounter: 56.7 kg. ? ? ? ?DVT prophylaxis: lovenox ?Family Communication:none ?Status is: Observation ?The patient remains OBS appropriate and will d/c before 2 midnights. ? ? ? ?Code Status:  ? ?  ?Code Status Orders  ?(From admission, onward)  ?  ? ? ?  ? ?  Start     Ordered  ? 11/09/21 0242  Do not attempt resuscitation (DNR)  Continuous       ?Question Answer Comment  ?In the event of cardiac or respiratory ARREST Do not call a ?code blue?   ?In the event of cardiac or respiratory ARREST Do not perform Intubation, CPR, defibrillation or ACLS   ?In the event of cardiac or respiratory ARREST Use medication by any route, position, wound care, and other measures to relive pain and suffering. May use oxygen, suction and manual treatment of airway obstruction as needed for comfort.   ?  ? 11/09/21 0243  ? ?  ?  ? ?  ? ?Code Status History   ? ? Date Active Date Inactive Code Status Order ID Comments User Context  ? 11/09/2021 0053 11/09/2021 0243 DNR [id number]  Rise Patience, MD Inpatient  ? 11/21/2017 1256 11/27/2017 2023 DNR [id number]  Mariel Aloe, MD ED  ? ?  ? ? ? ? ?IV Access:  ? ?Peripheral IV ? ? ?Procedures and diagnostic studies:  ? ?CT ABDOMEN PELVIS W CONTRAST ? ?Result Date: 11/08/2021 ?CLINICAL DATA:  Decubitus ulcer. EXAM: CT ABDOMEN AND PELVIS WITH CONTRAST TECHNIQUE: Multidetector CT imaging of the abdomen and pelvis was performed using the standard protocol following bolus administration of intravenous contrast. RADIATION DOSE REDUCTION: This exam was performed according to the departmental dose-optimization program which includes automated exposure control, adjustment of the mA and/or kV according to patient size and/or use of iterative reconstruction technique. CONTRAST:  15mL OMNIPAQUE IOHEXOL 300 MG/ML   SOLN COMPARISON:  06/24/2021 FINDINGS: Lower Chest: No acute findings. Hepatobiliary:  No hepatic masses identified. Pancreas:  No mass or inflammatory changes. Spleen: Within normal limits in size and appearance. Adrenals/Urinary Tract: No masses identified. A few tiny benign renal cysts are noted bilaterally (no followup imaging is recommended). A 2 mm calculus is noted in the midpole the right kidney. No evidence of ureteral calculi or hydronephrosis. Bladder is distended. Lower portion of bladder is not visualized due to severe artifact from bilateral hip prostheses, however at least 2 calculi are seen along the left lateral bladder wall, largest measuring 6 mm. Stomach/Bowel: No evidence of obstruction, inflammatory process or abnormal fluid collections. Vascular/Lymphatic: No pathologically enlarged lymph nodes. No acute vascular findings. Aortic atherosclerotic calcification incidentally noted. Reproductive:  No mass or other significant abnormality. Other:  None. Musculoskeletal: A decubitus ulcer is seen overlying the distal sacrum and coccyx. No evidence of soft tissue abscess. Osteolysis is seen at the junction of the sacrum and coccyx, consistent with osteomyelitis (see sagittal images 70-76/series 6). No other suspicious bone lesions identified. IMPRESSION:  Decubitus ulcer, with osteomyelitis at the junction of the sacrum and coccyx. No evidence of soft tissue abscess. Distended urinary bladder, with at least 2 bladder calculi measuring up to 6 mm. Tiny nonobstructing right renal calculus. No evidence of ureteral calculi or hydronephrosis. Aortic Atherosclerosis (ICD10-I70.0). Electronically Signed   By: Marlaine Hind M.D.   On: 11/08/2021 21:50  ? ?DG Foot 2 Views Left ? ?Result Date: 11/08/2021 ?CLINICAL DATA:  Pressure wound left heel. EXAM: LEFT FOOT - 2 VIEW COMPARISON:  None Available. FINDINGS: The bones are diffusely osteopenic. There is soft tissue swelling of the forefoot. There is no radiopaque  foreign body. There is no acute fracture or dislocation. No cortical erosions are identified. There are mild degenerative changes of the first metatarsophalangeal joint with hallux valgus. IMPRESSION: 1. No a

## 2021-11-10 DIAGNOSIS — Z8673 Personal history of transient ischemic attack (TIA), and cerebral infarction without residual deficits: Secondary | ICD-10-CM | POA: Diagnosis not present

## 2021-11-10 DIAGNOSIS — M869 Osteomyelitis, unspecified: Secondary | ICD-10-CM | POA: Diagnosis not present

## 2021-11-10 DIAGNOSIS — L0291 Cutaneous abscess, unspecified: Secondary | ICD-10-CM

## 2021-11-10 DIAGNOSIS — L89154 Pressure ulcer of sacral region, stage 4: Secondary | ICD-10-CM | POA: Diagnosis not present

## 2021-11-10 DIAGNOSIS — M4628 Osteomyelitis of vertebra, sacral and sacrococcygeal region: Secondary | ICD-10-CM | POA: Diagnosis not present

## 2021-11-10 DIAGNOSIS — E43 Unspecified severe protein-calorie malnutrition: Secondary | ICD-10-CM

## 2021-11-10 LAB — BASIC METABOLIC PANEL
Anion gap: 7 (ref 5–15)
BUN: 16 mg/dL (ref 6–20)
CO2: 29 mmol/L (ref 22–32)
Calcium: 8.6 mg/dL — ABNORMAL LOW (ref 8.9–10.3)
Chloride: 103 mmol/L (ref 98–111)
Creatinine, Ser: 0.38 mg/dL — ABNORMAL LOW (ref 0.44–1.00)
GFR, Estimated: >60 mL/min (ref >60)
Glucose, Bld: 108 mg/dL — ABNORMAL HIGH (ref 70–99)
Potassium: 4.2 mmol/L (ref 3.5–5.1)
Sodium: 139 mmol/L (ref 135–145)

## 2021-11-10 MED ORDER — HYDROCORTISONE 1 % EX CREA
1.0000 "application " | TOPICAL_CREAM | Freq: Three times a day (TID) | CUTANEOUS | Status: DC | PRN
  Administered 2021-11-10 – 2021-11-11 (×2): 1 via TOPICAL
  Filled 2021-11-10: qty 28

## 2021-11-10 MED ORDER — BOOST / RESOURCE BREEZE PO LIQD CUSTOM
1.0000 | Freq: Two times a day (BID) | ORAL | Status: DC
  Administered 2021-11-10 – 2021-11-11 (×4): 1 via ORAL

## 2021-11-10 MED ORDER — METRONIDAZOLE 500 MG PO TABS
500.0000 mg | ORAL_TABLET | Freq: Two times a day (BID) | ORAL | Status: DC
  Administered 2021-11-10 – 2021-11-13 (×6): 500 mg via ORAL
  Filled 2021-11-10 (×7): qty 1

## 2021-11-10 MED ORDER — ADULT MULTIVITAMIN W/MINERALS CH
1.0000 | ORAL_TABLET | Freq: Every day | ORAL | Status: DC
  Administered 2021-11-10 – 2021-11-13 (×4): 1 via ORAL
  Filled 2021-11-10 (×4): qty 1

## 2021-11-10 NOTE — Progress Notes (Signed)
Initial Nutrition Assessment  DOCUMENTATION CODES:   Non-severe (moderate) malnutrition in context of chronic illness  INTERVENTION:   -Ensure Plus High Protein po BID, each supplement provides 350 kcal and 20 grams of protein.   -Boost Breeze po BID, each supplement provides 250 kcal and 9 grams of protein   -Prosource Plus PO BID, each provides 100 kcals and 15g protein  -Multivitamin with minerals daily  -Liberalized diet to regular to optimize menu options  NUTRITION DIAGNOSIS:   Moderate Malnutrition related to chronic illness (h/o CVA, chronic wounds) as evidenced by mild fat depletion, mild muscle depletion.   GOAL:   Patient will meet greater than or equal to 90% of their needs  MONITOR:   PO intake, Supplement acceptance, Labs, Weight trends, I & O's, Skin  REASON FOR ASSESSMENT:   Malnutrition Screening Tool    ASSESSMENT:   61 y.o. 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  Started on empiric antibiotics in the ED CT scan showed features concerning for osteomyelitis of the sacrum and coccyx.  Patient in room, states she has been eating better recently since starting an appetite stimulant. She states she typically is not eating 3 meals a day. During this admission, pt has been refusing breakfast meals. Did consume 50% of lunch, really liked the pot roast. Pt likes Ensure Clear supplements but is willing to drink Ensure Plus and Boost Breeze here for additional protein. Prosource Plus has been ordered and pt accepting. Pt states she usually takes Vitamin D and C as well as calcium.   Will add daily MVI and liberalize diet to help encourage intakes.   Pt states it is difficult for her to be weighed. Unable to say her UBW.  Weight for this admission same  as weight form 3/15.   Medications: Vitamin C, OSCAL-D, MAG-OX, Remeron  Labs reviewed.  NUTRITION - FOCUSED PHYSICAL EXAM: H/o CVA, right sided hemiplegia.  Flowsheet Row Most Recent Value  Orbital Region Mild depletion  Upper Arm Region Mild depletion  [pain in right arm]  Thoracic and Lumbar Region Mild depletion  Buccal Region Mild depletion  Temple Region Moderate depletion  Clavicle Bone Region Mild depletion  Clavicle and Acromion Bone Region Mild depletion  Scapular Bone Region Unable to assess  Dorsal Hand Mild depletion  Patellar Region Unable to assess  [contractures]  Anterior Thigh Region Unable to assess  Posterior Calf Region Unable to assess  Edema (RD Assessment) Mild  Hair Reviewed  [dry patches on scalp]  Eyes Reviewed  Mouth Reviewed  [edentulous]  Skin Reviewed  [dry]  Nails Reviewed  [brittle, broken, some blood around cuticle]       Diet Order:   Diet Order             Diet regular Room service appropriate? Yes; Fluid consistency: Thin  Diet effective now                   EDUCATION NEEDS:   No education needs have been identified at this time  Skin:  Skin Assessment: Skin Integrity Issues: Skin Integrity Issues:: Unstageable, Stage IV, DTI, Stage II DTI: left IT Stage II: right ankle Stage IV: sacrum Unstageable: left ankle  Last BM:  5/18 -type 4  Height:   Ht Readings from Last 1 Encounters:  11/08/21  5\' 5"  (1.651 m)    Weight:   Wt Readings from Last 1 Encounters:  11/08/21 56.7 kg    BMI:  Body mass index is 20.8 kg/m.  Estimated Nutritional Needs:   Kcal:  [id number]  Protein:  85-95g  Fluid:  2L/day   Clayton Bibles, MS, RD, LDN Inpatient Clinical Dietitian Contact information available via Amion

## 2021-11-10 NOTE — Progress Notes (Signed)
Upon assessment, pt noted to have swelling and bleeding to vaginal area following foley catheter insertion attempts. Foley continues to drain and no blood nor clots noted in urine. MD notified and made aware. Plan of care ongoing.

## 2021-11-10 NOTE — Progress Notes (Signed)
Pt declined dressing changes this AM. Agreed to have them changed later today. Plan of care ongoing.

## 2021-11-10 NOTE — Evaluation (Signed)
Physical Therapy Evaluation Patient Details Name: [name] [name] MRN: [id number] DOB: [birth year] Today's Date: 11/10/2021  History of Present Illness  Pt is a 61 y.o. female presenting with worsening back pain radiating to both lower extremities admitted for managementof sacral osteomyelitis. PMH significant for stage IV sacral Ulcer, CVA with right-sided hemiplegia, bipolar disorder, chronic pain, anxiety, depression, gout, osteoporosis, pulmonary embolism, L THA (2000), and R partial hip arthroplasty (2001).   Clinical Impression  Pt is a 61 y.o. female with above HPI resulting in the deficits listed below (see PT Problem List). Pt admitted from SNF and reports that she was receiving assist with bathing/dressing/toileting/grooming and had at one point been using a standing assist device for transfers and able to assist with rolling in bed, but has recently been using 2 person assist and mechanical lift. States she has not been up to her w/c in about a week and has been having increasing pain. Pt total A for rolling in bed today, unable to tolerate any other activity due to pain. Will trial PT services and assess mobility progression during stay to work towards maximizing functional mobility to increase independence.         Recommendations for follow up therapy are one component of a multi-disciplinary discharge planning process, led by the attending physician.  Recommendations may be updated based on patient status, additional functional criteria and insurance authorization.  Follow Up Recommendations Home health PT (return to SNF)    Assistance Recommended at Discharge Frequent or constant Supervision/Assistance  Patient can return home with the following  Two people to help with walking and/or transfers;Two people to help with bathing/dressing/bathroom;Assistance with cooking/housework;Assist for transportation;Help with stairs or ramp for entrance    Equipment Recommendations     Recommendations for Other Services       Functional Status Assessment Patient has had a recent decline in their functional status and/or demonstrates limited ability to make significant improvements in function in a reasonable and predictable amount of time     Precautions / Restrictions Precautions Precautions: Fall Precaution Comments: skin integrity, R hemi Restrictions Weight Bearing Restrictions: No      Mobility  Bed Mobility Overal bed mobility: Needs Assistance Bed Mobility: Rolling Rolling: Total assist, +2 for safety/equipment         General bed mobility comments: Pt total assist to roll to R and vocalizing out in pain, able to attempt to reach L UE toward rail. Reports she was able to assist with rolling for staff at SNF but has been having increased pain. RN in during session for wound dressing changes.    Transfers                        Ambulation/Gait                  Stairs            Wheelchair Mobility    Modified Rankin (Stroke Patients Only)       Balance                                             Pertinent Vitals/Pain Pain Assessment Pain Assessment: Faces Faces Pain Scale: Hurts whole lot Pain Location: sacral region Pain Descriptors / Indicators: Grimacing, Crying, Moaning Pain Intervention(s): Limited activity within patient's tolerance, Monitored during session  Home Living Family/patient expects to be discharged to:: Skilled nursing facility                        Prior Function Prior Level of Function : Needs assist       Physical Assist : Mobility (physical);ADLs (physical) Mobility (physical): Bed mobility;Transfers ADLs (physical): Bathing;Dressing;Toileting;Grooming Mobility Comments: States that she was receiving PT at one point at SNF and was able to assist with rolling in bed and was doing stand transfers with DME (stedy?). Has recently been using mechanical lift  and +2 assist. Reports that she has not been up to her w/c in about 1 week and has not been performing standing transfers "in a while" unable to state how long but weeks-months (giving therapist an upset face when asked if it had been close to a year and stating "no,it hasn't been a year") ADLs Comments: assist from staff     Hand Dominance        Extremity/Trunk Assessment   Upper Extremity Assessment Upper Extremity Assessment: RUE deficits/detail RUE Deficits / Details: R UE flexion contracture    Lower Extremity Assessment Lower Extremity Assessment: RLE deficits/detail;LLE deficits/detail RLE Deficits / Details: able to perform limited ROM DF/PF in supine, unable to perform any other active range- vocalizing out in pain when therapist attempting to move LEs RLE: Unable to fully assess due to pain RLE Sensation: WNL LLE Deficits / Details: unable to perform any active range- vocalizing out in pain when therapist attempting to move LEs LLE: Unable to fully assess due to pain LLE Sensation: WNL       Communication   Communication: No difficulties  Cognition Arousal/Alertness: Awake/alert Behavior During Therapy: WFL for tasks assessed/performed Overall Cognitive Status: Within Functional Limits for tasks assessed                                          General Comments      Exercises     Assessment/Plan    PT Assessment Patient needs continued PT services  PT Problem List         PT Treatment Interventions DME instruction;Gait training;Functional mobility training;Therapeutic activities;Therapeutic exercise;Balance training;Patient/family education;Wheelchair mobility training;Neuromuscular re-education    PT Goals (Current goals can be found in the Care Plan section)  Acute Rehab PT Goals Patient Stated Goal: have less pain PT Goal Formulation: With patient Time For Goal Achievement: 11/24/21 Potential to Achieve Goals: Fair    Frequency  Min 2X/week     Co-evaluation               AM-PAC PT "6 Clicks" Mobility  Outcome Measure Help needed turning from your back to your side while in a flat bed without using bedrails?: Total Help needed moving from lying on your back to sitting on the side of a flat bed without using bedrails?: Total Help needed moving to and from a bed to a chair (including a wheelchair)?: Total Help needed standing up from a chair using your arms (e.g., wheelchair or bedside chair)?: Total Help needed to walk in hospital room?: Total Help needed climbing 3-5 steps with a railing? : Total 6 Click Score: 6    End of Session   Activity Tolerance: Patient limited by pain Patient left: in bed;with call bell/phone within reach;with nursing/sitter in room Nurse Communication: Mobility status  Time: [id number] PT Time Calculation (min) (ACUTE ONLY): 17 min   Charges:   PT Evaluation $PT Eval Low Complexity: 1 Low          Festus Barren PT, DPT  Acute Rehabilitation Services  Office [phone] 11/10/2021, 6:39 PM

## 2021-11-10 NOTE — Progress Notes (Signed)
Pt from [facility]. TOC will follow for any needs and DC coordination to SNF.

## 2021-11-10 NOTE — Consult Note (Signed)
[facility]    Date of Admission:  11/08/2021     Reason for Consult: Sacral OM     Referring Physician: Dr Robb Matar  Current antibiotics: Vancomycin Cefepime  ASSESSMENT:    61 y.o. female admitted with:  Sacral osteomyelitis and potential phlegmon/abscess: She is currently on vancomycin and cefepime with MRI confirming sacral infection on 11/09/21.  Blood cultures are negative and she is hemodynamically stable. History of CVA: Patient has residual right sided deficits and is largely bedbound subsequent to her CVA. Multiple pressure injuries: Seen by WOC this admission. Protein calorie malnutrition: Albumin is 2.5.  RECOMMENDATIONS:    Continue vancomycin and cefepime Will add metronidazole Ask IR to see about aspiration/drainage of possible sacral abscess Continue wound care Nutrition, offloading, and keeping wounds free or urine and stool are also keys to sacral wound care PICC line Will follow    Principal Problem:   Sacral osteomyelitis (HCC) Active Problems:   History of CVA (cerebrovascular accident)   Anemia   Decubitus ulcer of sacral region   Osteomyelitis (HCC)   MEDICATIONS:    Scheduled Meds:  (feeding supplement) PROSource Plus  30 mL Oral BID BM   allopurinol  100 mg Oral Daily   vitamin C  500 mg Oral Daily   aspirin  81 mg Oral Daily   calcium-vitamin D  1 tablet Oral BID   Chlorhexidine Gluconate Cloth  6 each Topical Q0600   cycloSPORINE  1 drop Both Eyes BID   enoxaparin (LOVENOX) injection  40 mg Subcutaneous Q24H   feeding supplement  237 mL Oral BID BM   FLUoxetine  40 mg Oral Daily   gabapentin  600 mg Oral Q8H   magnesium oxide  400 mg Oral Daily   mirtazapine  7.5 mg Oral QHS   mupirocin ointment  1 application. Nasal BID   tiZANidine  2 mg Oral QHS   tiZANidine  4 mg Oral BID   Continuous Infusions:  ceFEPime (MAXIPIME) IV 2 g (11/10/21 0909)   vancomycin 1,250 mg (11/10/21 0123)   PRN  Meds:.acetaminophen **OR** acetaminophen, melatonin, morphine injection, polyvinyl alcohol  HPI:    [name] [name] is a 61 y.o. female with PMHx as below including history of CVA and right sided hemiplegia who presented from her SNF with complaint of worsening back pain for several days prior to admission.  She reports the staff at her SNF does daily dressing changings and wound care.  In the ED she was found to have increased drainage from her sacral wound.  She was afebrile and WBC normal.  CT scan was obtained consistent with osteomyelitis.  She was started on vancomycin and cefepime.  She reports she is not sure if her wound gets soiled with stool or urine.  When she uses the bathroom at her SNF the CNA cleans her up but does not do any further wound assessment or dressing changes. She was seen by [facility] on admission and noted to have several pressure injuries.  An MRI was obtained and this showed abnormality in the distal sacrum c/w OM as well as fluid along the dorsal sacrum likely phlegmon or abscess.  She had no evidence of infection of her lumbar spine.  We are consulted for antibiotic recommendations.    Past Medical History:  Diagnosis Date   Anxiety    Bipolar disorder (HCC)    Chronic pain    CVA (cerebral vascular accident) [facility]) Jul 22, 1999   right  side   Depression    Gout    Osteoporosis    Pap smear for cervical cancer screening    5+ years ago normal   Pulmonary embolism (HCC)    bilateral   Tobacco abuse    Vitamin D deficiency     Social History   Tobacco Use   Smoking status: Some Days    Packs/day: 0.50    Types: Cigarettes   Smokeless tobacco: Never  Substance Use Topics   Alcohol use: Yes    Comment: Varies    Drug use: Yes    Types: Marijuana    Family History  Problem Relation Age of Onset   Stroke Mother    Stroke Brother    Hypertension Brother    Heart murmur Brother     Allergies  Allergen Reactions   Ativan [Lorazepam]    Bactrim  [Sulfamethoxazole-Trimethoprim]     Not On MAR   Paxil [Paroxetine Hcl]     Not on MAR    Review of Systems  All other systems reviewed and are negative. Except as noted in HPI.   OBJECTIVE:   Blood pressure (!) 91/58, pulse 70, temperature 97.8 F (36.6 C), resp. rate 17, height 5\' 5"  (1.651 m), weight 56.7 kg, SpO2 96 %. Body mass index is 20.8 kg/m.  Physical Exam Constitutional:      General: She is not in acute distress.    Appearance: Normal appearance.  HENT:     Head: Normocephalic and atraumatic.  Eyes:     Extraocular Movements: Extraocular movements intact.     Conjunctiva/sclera: Conjunctivae normal.  Cardiovascular:     Rate and Rhythm: Normal rate and regular rhythm.  Pulmonary:     Effort: Pulmonary effort is normal. No respiratory distress.     Breath sounds: Normal breath sounds.  Abdominal:     General: There is no distension.     Palpations: Abdomen is soft.     Tenderness: There is no abdominal tenderness.  Musculoskeletal:     Cervical back: Normal range of motion and neck supple.     Comments: Right upper and lower extremity contractures.   Skin:    General: Skin is warm and dry.     Findings: No rash.  Neurological:     General: No focal deficit present.     Mental Status: She is alert and oriented to person, place, and time. Mental status is at baseline.  Psychiatric:        Mood and Affect: Mood normal.        Behavior: Behavior normal.     Lab Results: Lab Results  Component Value Date   WBC 8.9 11/09/2021   HGB 10.6 (L) 11/09/2021   HCT 34.3 (L) 11/09/2021   MCV 89.3 11/09/2021   PLT 322 11/09/2021    Lab Results  Component Value Date   NA 139 11/10/2021   K 4.2 11/10/2021   CO2 29 11/10/2021   GLUCOSE 108 (H) 11/10/2021   BUN 16 11/10/2021   CREATININE 0.38 (L) 11/10/2021   CALCIUM 8.6 (L) 11/10/2021   GFRNONAA >60 11/10/2021   GFRAA >60 11/27/2017    Lab Results  Component Value Date   ALT 14 11/09/2021   AST 13 (L)  11/09/2021   ALKPHOS 51 11/09/2021   BILITOT 0.4 11/09/2021    No results found for: CRP     Component Value Date/Time   ESRSEDRATE 17 11/26/2012 1307    I have reviewed the micro and  lab results in Epic.  Imaging: MR LUMBAR SPINE WO CONTRAST  Result Date: 11/09/2021 CLINICAL DATA:  Severe lower back pain EXAM: MRI LUMBAR SPINE WITHOUT CONTRAST TECHNIQUE: Multiplanar, multisequence MR imaging of the lumbar spine was performed. No intravenous contrast was administered. COMPARISON:  CT abdomen/pelvis dated 1 day prior FINDINGS: Segmentation: Standard; the lowest formed disc space is designated L5-S1. Alignment:  Normal. Vertebrae: Vertebral body heights are preserved. Background marrow signal is somewhat heterogeneous throughout without focal suspicious signal abnormality. There is no evidence of discitis/osteomyelitis in the lumbar spine. There is STIR signal abnormality in the distal sacrum consistent with osteomyelitis as seen on the prior CT abdomen/pelvis. There is fluid signal tracking along the dorsal aspect of the sacrum which could reflect phlegmon or abscess. There is no evidence of epidural fluid collection in the lumbar canal. Conus medullaris and cauda equina: Conus extends to the L1-L2 level. Conus and cauda equina appear normal. Paraspinal and other soft tissues: There is edema in the soft tissues overlying the distal sacrum. The paraspinal soft tissues are otherwise unremarkable. Disc levels: Lumbar disc heights are overall preserved. T12-L1: No significant spinal canal or neural foraminal stenosis L1-L2: No significant spinal canal or neural foraminal stenosis L2-L3: Mild facet arthropathy without significant spinal canal or neural foraminal stenosis L3-L4: Mild facet arthropathy without significant spinal canal or neural foraminal stenosis L4-L5: There is a mild disc bulge and mild bilateral facet arthropathy resulting in mild narrowing of the subarticular zones without evidence of  nerve root impingement and mild bilateral neural foraminal stenosis L5-S1: No significant spinal canal or neural foraminal stenosis. IMPRESSION: 1. Signal abnormality in the distal sacrum consistent with osteomyelitis as seen on the prior CT abdomen/pelvis. There is thin fluid signal tracking along the dorsal sacrum which may reflect phlegmon or abscess. 2. No evidence of discitis/osteomyelitis in the lumbar spine, and no evidence of epidural phlegmon/abscess in the lumbar canal. 3. Mild degenerative changes without significant spinal canal or neural foraminal stenosis. Electronically Signed   By: Lesia Hausen M.D.   On: 11/09/2021 15:19   CT ABDOMEN PELVIS W CONTRAST  Result Date: 11/08/2021 CLINICAL DATA:  Decubitus ulcer. EXAM: CT ABDOMEN AND PELVIS WITH CONTRAST TECHNIQUE: Multidetector CT imaging of the abdomen and pelvis was performed using the standard protocol following bolus administration of intravenous contrast. RADIATION DOSE REDUCTION: This exam was performed according to the departmental dose-optimization program which includes automated exposure control, adjustment of the mA and/or kV according to patient size and/or use of iterative reconstruction technique. CONTRAST:  OMNIPAQUE IOHEXOL 300 MG/ML  SOLN COMPARISON:  06/24/2021 FINDINGS: Lower Chest: No acute findings. Hepatobiliary:  No hepatic masses identified. Pancreas:  No mass or inflammatory changes. Spleen: Within normal limits in size and appearance. Adrenals/Urinary Tract: No masses identified. A few tiny benign renal cysts are noted bilaterally (no followup imaging is recommended). A 2 mm calculus is noted in the midpole the right kidney. No evidence of ureteral calculi or hydronephrosis. Bladder is distended. Lower portion of bladder is not visualized due to severe artifact from bilateral hip prostheses, however at least 2 calculi are seen along the left lateral bladder wall, largest measuring 6 mm. Stomach/Bowel: No evidence of  obstruction, inflammatory process or abnormal fluid collections. Vascular/Lymphatic: No pathologically enlarged lymph nodes. No acute vascular findings. Aortic atherosclerotic calcification incidentally noted. Reproductive:  No mass or other significant abnormality. Other:  None. Musculoskeletal: A decubitus ulcer is seen overlying the distal sacrum and coccyx. No evidence of soft tissue abscess.  Osteolysis is seen at the junction of the sacrum and coccyx, consistent with osteomyelitis (see sagittal images 70-76/series 6). No other suspicious bone lesions identified. IMPRESSION: Decubitus ulcer, with osteomyelitis at the junction of the sacrum and coccyx. No evidence of soft tissue abscess. Distended urinary bladder, with at least 2 bladder calculi measuring up to 6 mm. Tiny nonobstructing right renal calculus. No evidence of ureteral calculi or hydronephrosis. Aortic Atherosclerosis (ICD10-I70.0). Electronically Signed   By: Danae OrleansJohn A Stahl M.D.   On: 11/08/2021 21:50   DG Foot 2 Views Left  Result Date: 11/08/2021 CLINICAL DATA:  Pressure wound left heel. EXAM: LEFT FOOT - 2 VIEW COMPARISON:  None Available. FINDINGS: The bones are diffusely osteopenic. There is soft tissue swelling of the forefoot. There is no radiopaque foreign body. There is no acute fracture or dislocation. No cortical erosions are identified. There are mild degenerative changes of the first metatarsophalangeal joint with hallux valgus. IMPRESSION: 1. No acute bony abnormality. 2. Soft tissue swelling of the forefoot. 3. Diffuse osteopenia. 4. Mild degenerative changes of the first metatarsophalangeal joint with hallux valgus. Electronically Signed   By: Darliss CheneyAmy  Guttmann M.D.   On: 11/08/2021 20:08   VAS US ABI WITH/WO TBI  Result Date: 11/09/2021  LOWER EXTREMITY DOPPLER STUDY Patient Name:  [name] [name]  Date of Exam:   11/09/2021 Medical Rec #: [id number]       Accession #:    [id number] Date of Birth: Oct 20, 1960      Patient Gender: F  Patient Age:   89 years [facility] Procedure:      VAS US ABI WITH/WO TBI Referring Phys: Midge MiniumARSHAD KAKRAKANDY --------------------------------------------------------------------------------  Indications: Chronic leg pain. High Risk Factors: Current smoker, prior CVA.  Limitations: Today's exam was limited due to Patient is contracted and immobile              from previous stroke. Performing Technologist: Ernestene MentionHill, Jody RVT, RDMS  Examination Guidelines: A complete evaluation includes at minimum, Doppler waveform signals and systolic blood pressure reading at the level of bilateral brachial, anterior tibial, and posterior tibial arteries, when vessel segments are accessible. Bilateral testing is considered an integral part of a complete examination. Photoelectric Plethysmograph (PPG) waveforms and toe systolic pressure readings are included as required and additional duplex testing as needed. Limited examinations for reoccurring indications may be performed as noted.  ABI Findings: +---------+------------------+-----+---------+----------+ Right    Rt Pressure (mmHg)IndexWaveform Comment    +---------+------------------+-----+---------+----------+ Brachial                                 contracted +---------+------------------+-----+---------+----------+ PTA                                      contracte  +---------+------------------+-----+---------+----------+ DP       93                0.86 triphasic           +---------+------------------+-----+---------+----------+ Great Toe77                0.71 Normal              +---------+------------------+-----+---------+----------+ +---------+------------------+-----+---------+-------+ Left     Lt Pressure (mmHg)IndexWaveform Comment +---------+------------------+-----+---------+-------+ Brachial 108  triphasic        +---------+------------------+-----+---------+-------+ PTA      112                1.04 triphasic        +---------+------------------+-----+---------+-------+ DP       112               1.04 biphasic         +---------+------------------+-----+---------+-------+ Great Toe84                0.78 Normal           +---------+------------------+-----+---------+-------+ +-------+-----------+-----------+------------+------------+ ABI/TBIToday's ABIToday's TBIPrevious ABIPrevious TBI +-------+-----------+-----------+------------+------------+ Right  0.86       0.71                                +-------+-----------+-----------+------------+------------+ Left   1.04       0.78                                +-------+-----------+-----------+------------+------------+  Summary: Right: Resting right ankle-brachial index indicates mild right lower extremity arterial disease. The right toe-brachial index is normal. Left: Resting left ankle-brachial index is within normal range. No evidence of significant left lower extremity arterial disease. The left toe-brachial index is normal. *See table(s) above for measurements and observations.  Electronically signed by Heath Lark on 11/09/2021 at 9:27:56 PM.    Final      Imaging independently reviewed in Epic.  [facility] [phone] pager 11/10/2021, 12:00 PM

## 2021-11-10 NOTE — Progress Notes (Signed)
[facility] PROGRESS NOTE    Progress Note  [name] [name]  [id number] DOB: [birth year] DOA: 11/08/2021 PCP: Pcp, No     Brief Narrative:   [name] [name] is an 61 y.o. 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 She was started empirically on vancomycin and cefepime. Tmax of 99.5, culture data negative to date. MRI of the spine distal sacrum osteomyelitis fluid signal tracking along the dorsal sacrum which may reflect an abscess.  No evidence of discitis or osteomyelitis. Consult infectious disease.  Bilateral lower extremity pain: Radiating from the back likely due to his acute decubitus ulcer  History of CVA: Continue aspirin.  Normocytic anemia: Anemia panel will be unreliable. Hemoglobin is stable.  History of chronic pain: Continue gabapentin IV morphine as needed.  History of depression: Continue fluoxetine.  Unstageable sacral decubitus ulcer pressure ulcer present on admission: RN Pressure Injury Documentation: Pressure Injury 11/09/21 Coccyx Unstageable - Full thickness tissue loss in which the base of the injury is covered by slough (yellow, tan, gray, green or brown) and/or eschar (tan, brown or black) in the wound bed. (Active)  11/09/21 0223  Location: Coccyx  Location Orientation:   Staging: Unstageable - Full thickness tissue loss in which the base of the injury is covered by slough (yellow, tan, gray, green or brown) and/or eschar (tan, brown or black) in the wound bed.  Wound Description  (Comments):   Present on Admission: Yes  Dressing Type Foam - Lift dressing to assess site every shift 11/09/21 2346     Pressure Injury 11/09/21 Ankle Left;Medial Unstageable - Full thickness tissue loss in which the base of the injury is covered by slough (yellow, tan, gray, green or brown) and/or eschar (tan, brown or black) in the wound bed. (Active)  11/09/21 0226  Location: Ankle  Location Orientation: Left;Medial  Staging: Unstageable - Full thickness tissue loss in which the base of the injury is covered by slough (yellow, tan, gray, green or brown) and/or eschar (tan, brown or black) in the wound bed.  Wound Description (Comments):   Present on Admission: Yes  Dressing Type Foam - Lift dressing to assess site every shift 11/09/21 2346     Pressure Injury 11/09/21 Ankle Left;Lateral Unstageable - Full thickness tissue loss in which the base of the injury is covered by slough (yellow, tan, gray, green or brown) and/or eschar (tan, brown or black) in the wound bed. (Active)  11/09/21 0226  Location: Ankle  Location Orientation: Left;Lateral  Staging: Unstageable - Full thickness tissue loss in which the base of the injury is covered by slough (yellow, tan, gray, green or brown) and/or eschar (tan, brown or black) in the wound bed.  Wound Description (Comments):   Present on Admission: Yes  Dressing Type Foam - Lift dressing to assess site every shift 11/09/21 2346     Pressure Injury 11/09/21 Buttocks Left Unstageable - Full thickness tissue loss in which the base of the injury is covered by slough (yellow, tan, gray, green or brown) and/or eschar (tan, brown or black) in the wound bed. (Active)  11/09/21 0043  Location: Buttocks  Location Orientation: Left  Staging: Unstageable - Full thickness tissue loss in which the base of the injury is covered by slough (yellow, tan, gray, green or brown) and/or eschar (tan, brown or black) in the wound bed.  Wound Description (Comments):    Present on Admission: Yes  Dressing Type Foam - Lift dressing to assess site every shift 11/09/21 2346    Estimated body mass index is 20.8 kg/m as calculated from the following:   Height as of this encounter: 5\' 5"  (1.651 m).   Weight as of this encounter: 56.7 kg.    DVT prophylaxis: lovenox Family Communication:none Status is: Observation The patient remains OBS appropriate and will d/c before 2 midnights.    Code Status:     Code Status Orders  (From admission, onward)           Start     Ordered   11/09/21 0242  Do not attempt resuscitation (DNR)  Continuous       Question Answer Comment  In the event of cardiac or respiratory ARREST Do not call a "code blue"   In the event of cardiac or respiratory ARREST Do not perform Intubation, CPR, defibrillation or ACLS   In the event of cardiac or respiratory ARREST Use medication by any route, position, wound care, and other measures to relive pain and suffering. May use oxygen, suction and manual treatment of airway obstruction as needed for comfort.      11/09/21 0243           Code Status History     Date Active Date Inactive Code Status Order ID Comments User Context   11/09/2021 0053 11/09/2021 0243 DNR [id number]  Rise Patience, MD Inpatient   11/21/2017 1256 11/27/2017 2023 DNR [id number]  Mariel Aloe, MD ED         IV Access:   Peripheral IV   Procedures and diagnostic studies:   MR LUMBAR SPINE WO CONTRAST  Result Date: 11/09/2021 CLINICAL DATA:  Severe lower back pain EXAM: MRI LUMBAR SPINE WITHOUT CONTRAST TECHNIQUE: Multiplanar, multisequence MR imaging of the lumbar spine was performed. No intravenous contrast was administered. COMPARISON:  CT abdomen/pelvis dated 1 day prior FINDINGS: Segmentation: Standard; the lowest formed disc space is designated L5-S1. Alignment:  Normal. Vertebrae: Vertebral body heights are preserved. Background marrow signal is somewhat heterogeneous throughout  without focal suspicious signal abnormality. There is no evidence of discitis/osteomyelitis in the lumbar spine. There is STIR signal abnormality in the distal sacrum consistent with osteomyelitis as seen on the prior CT abdomen/pelvis. There is fluid signal tracking along the dorsal aspect of the sacrum which could reflect phlegmon or abscess. There is no evidence of epidural fluid collection in the lumbar canal. Conus medullaris and cauda equina: Conus extends to the L1-L2 level. Conus and cauda equina appear normal. Paraspinal and other soft tissues: There is edema in the soft tissues overlying the distal sacrum. The paraspinal soft tissues are otherwise unremarkable. Disc levels: Lumbar disc heights are overall preserved. T12-L1: No significant spinal canal or neural foraminal stenosis L1-L2: No significant spinal canal or neural foraminal stenosis L2-L3: Mild facet arthropathy without significant spinal canal or neural foraminal stenosis L3-L4: Mild facet arthropathy without significant spinal canal or neural foraminal stenosis L4-L5: There is a mild disc bulge and mild bilateral facet arthropathy resulting in mild narrowing of the subarticular zones without evidence of nerve root impingement and mild bilateral neural foraminal stenosis L5-S1: No significant  spinal canal or neural foraminal stenosis. IMPRESSION: 1. Signal abnormality in the distal sacrum consistent with osteomyelitis as seen on the prior CT abdomen/pelvis. There is thin fluid signal tracking along the dorsal sacrum which may reflect phlegmon or abscess. 2. No evidence of discitis/osteomyelitis in the lumbar spine, and no evidence of epidural phlegmon/abscess in the lumbar canal. 3. Mild degenerative changes without significant spinal canal or neural foraminal stenosis. Electronically Signed   By: Valetta Mole M.D.   On: 11/09/2021 15:19   CT ABDOMEN PELVIS W CONTRAST  Result Date: 11/08/2021 CLINICAL DATA:  Decubitus ulcer. EXAM: CT ABDOMEN  AND PELVIS WITH CONTRAST TECHNIQUE: Multidetector CT imaging of the abdomen and pelvis was performed using the standard protocol following bolus administration of intravenous contrast. RADIATION DOSE REDUCTION: This exam was performed according to the departmental dose-optimization program which includes automated exposure control, adjustment of the mA and/or kV according to patient size and/or use of iterative reconstruction technique. CONTRAST:  172mL OMNIPAQUE IOHEXOL 300 MG/ML  SOLN COMPARISON:  06/24/2021 FINDINGS: Lower Chest: No acute findings. Hepatobiliary:  No hepatic masses identified. Pancreas:  No mass or inflammatory changes. Spleen: Within normal limits in size and appearance. Adrenals/Urinary Tract: No masses identified. A few tiny benign renal cysts are noted bilaterally (no followup imaging is recommended). A 2 mm calculus is noted in the midpole the right kidney. No evidence of ureteral calculi or hydronephrosis. Bladder is distended. Lower portion of bladder is not visualized due to severe artifact from bilateral hip prostheses, however at least 2 calculi are seen along the left lateral bladder wall, largest measuring 6 mm. Stomach/Bowel: No evidence of obstruction, inflammatory process or abnormal fluid collections. Vascular/Lymphatic: No pathologically enlarged lymph nodes. No acute vascular findings. Aortic atherosclerotic calcification incidentally noted. Reproductive:  No mass or other significant abnormality. Other:  None. Musculoskeletal: A decubitus ulcer is seen overlying the distal sacrum and coccyx. No evidence of soft tissue abscess. Osteolysis is seen at the junction of the sacrum and coccyx, consistent with osteomyelitis (see sagittal images 70-76/series 6). No other suspicious bone lesions identified. IMPRESSION: Decubitus ulcer, with osteomyelitis at the junction of the sacrum and coccyx. No evidence of soft tissue abscess. Distended urinary bladder, with at least 2 bladder  calculi measuring up to 6 mm. Tiny nonobstructing right renal calculus. No evidence of ureteral calculi or hydronephrosis. Aortic Atherosclerosis (ICD10-I70.0). Electronically Signed   By: Marlaine Hind M.D.   On: 11/08/2021 21:50   DG Foot 2 Views Left  Result Date: 11/08/2021 CLINICAL DATA:  Pressure wound left heel. EXAM: LEFT FOOT - 2 VIEW COMPARISON:  None Available. FINDINGS: The bones are diffusely osteopenic. There is soft tissue swelling of the forefoot. There is no radiopaque foreign body. There is no acute fracture or dislocation. No cortical erosions are identified. There are mild degenerative changes of the first metatarsophalangeal joint with hallux valgus. IMPRESSION: 1. No acute bony abnormality. 2. Soft tissue swelling of the forefoot. 3. Diffuse osteopenia. 4. Mild degenerative changes of the first metatarsophalangeal joint with hallux valgus. Electronically Signed   By: Ronney Asters M.D.   On: 11/08/2021 20:08   VAS [place] ABI WITH/WO TBI  Result Date: 11/09/2021  LOWER EXTREMITY DOPPLER STUDY Patient Name:  GABRIAL KOSTICK  Date of Exam:   11/09/2021 Medical Rec #: [id number]       Accession #:    [id number] Date of Birth: 1961/03/08      Patient Gender: F Patient Age:   79 years Exam Location:  [facility] Procedure:      VAS [place] ABI WITH/WO TBI Referring Phys: Gean Birchwood --------------------------------------------------------------------------------  Indications: Chronic leg pain. High Risk Factors: Current smoker, prior CVA.  Limitations: Today's exam was limited due to Patient is contracted and immobile              from previous stroke. Performing Technologist: Rogelia Rohrer RVT, RDMS  Examination Guidelines: A complete evaluation includes at minimum, Doppler waveform signals and systolic blood pressure reading at the level of bilateral brachial, anterior tibial, and posterior tibial arteries, when vessel segments are accessible. Bilateral testing is considered an integral part  of a complete examination. Photoelectric Plethysmograph (PPG) waveforms and toe systolic pressure readings are included as required and additional duplex testing as needed. Limited examinations for reoccurring indications may be performed as noted.  ABI Findings: +---------+------------------+-----+---------+----------+ Right    Rt Pressure (mmHg)IndexWaveform Comment    +---------+------------------+-----+---------+----------+ Brachial                                 contracted +---------+------------------+-----+---------+----------+ PTA                                      contracte  +---------+------------------+-----+---------+----------+ DP       93                0.86 triphasic           +---------+------------------+-----+---------+----------+ Great Toe77                0.71 Normal              +---------+------------------+-----+---------+----------+ +---------+------------------+-----+---------+-------+ Left     Lt Pressure (mmHg)IndexWaveform Comment +---------+------------------+-----+---------+-------+ Brachial 108                    triphasic        +---------+------------------+-----+---------+-------+ PTA      112               1.04 triphasic        +---------+------------------+-----+---------+-------+ DP       112               1.04 biphasic         +---------+------------------+-----+---------+-------+ Great Toe84                0.78 Normal           +---------+------------------+-----+---------+-------+ +-------+-----------+-----------+------------+------------+ ABI/TBIToday's ABIToday's TBIPrevious ABIPrevious TBI +-------+-----------+-----------+------------+------------+ Right  0.86       0.71                                +-------+-----------+-----------+------------+------------+ Left   1.04       0.78                                +-------+-----------+-----------+------------+------------+  Summary: Right:  Resting right ankle-brachial index indicates mild right lower extremity arterial disease. The right toe-brachial index is normal. Left: Resting left ankle-brachial index is within normal range. No evidence of significant left lower extremity arterial disease. The left toe-brachial index is normal. *See table(s) above for measurements and observations.  Electronically signed by Jamelle Haring on 11/09/2021 at 9:27:56 PM.    Final  Medical Consultants:   None.   Subjective:    [name] no complaints today.  Objective:    Vitals:   11/09/21 0520 11/09/21 1350 11/09/21 2025 11/10/21 0350  BP:  (!) 146/67 114/68 (!) 91/58  Pulse: (!) 104 61 94 70  Resp:  16 18 17   Temp:  98.2 F (36.8 C) 99.5 F (37.5 C) 97.8 F (36.6 C)  TempSrc:      SpO2:   93% 96%  Weight:      Height:       SpO2: 96 %   Intake/Output Summary (Last 24 hours) at 11/10/2021 0944 Last data filed at 11/10/2021 0356 Gross per 24 hour  Intake 0 ml  Output 1150 ml  Net -1150 ml    Filed Weights   11/08/21 1709  Weight: 56.7 kg    Exam: General exam: In no acute distress, bedbound Respiratory system: Good air movement and clear to auscultation. Cardiovascular system: S1 & S2 heard, RRR. No JVD. Gastrointestinal system: Abdomen is nondistended, soft and nontender.  Extremities: No pedal edema. Psychiatry: Judgement and insight appear normal. Mood & affect appropriate.   Data Reviewed:    Labs: Basic Metabolic Panel: Recent Labs  Lab 11/08/21 1949 11/09/21 0521 11/10/21 0527  NA 138 139 139  K 3.9 3.6 4.2  CL 103 104 103  CO2 30 28 29   GLUCOSE 120* 112* 108*  BUN 28* 26* 16  CREATININE 0.45 0.40* 0.38*  CALCIUM 8.8* 8.5* 8.6*    GFR Estimated Creatinine Clearance: 67.8 mL/min (A) (by C-G formula based on SCr of 0.38 mg/dL (L)). Liver Function Tests: Recent Labs  Lab 11/09/21 0521  AST 13*  ALT 14  ALKPHOS 51  BILITOT 0.4  PROT 6.1*  ALBUMIN 2.5*    No results for  input(s): LIPASE, AMYLASE in the last 168 hours. No results for input(s): AMMONIA in the last 168 hours. Coagulation profile No results for input(s): INR, PROTIME in the last 168 hours. COVID-19 Labs  No results for input(s): DDIMER, FERRITIN, LDH, CRP in the last 72 hours.  Lab Results  Component Value Date   [place] NEGATIVE 09/07/2021    CBC: Recent Labs  Lab 11/08/21 1949 11/09/21 0521  WBC 9.6 8.9  NEUTROABS 5.8 5.2  HGB 10.5* 10.6*  HCT 32.8* 34.3*  MCV 89.1 89.3  PLT 315 322    Cardiac Enzymes: No results for input(s): CKTOTAL, CKMB, CKMBINDEX, TROPONINI in the last 168 hours. BNP (last 3 results) No results for input(s): PROBNP in the last 8760 hours. CBG: No results for input(s): GLUCAP in the last 168 hours. D-Dimer: No results for input(s): DDIMER in the last 72 hours. Hgb A1c: No results for input(s): HGBA1C in the last 72 hours. Lipid Profile: No results for input(s): CHOL, HDL, LDLCALC, TRIG, CHOLHDL, LDLDIRECT in the last 72 hours. Thyroid function studies: No results for input(s): TSH, T4TOTAL, T3FREE, THYROIDAB in the last 72 hours.  Invalid input(s): FREET3 Anemia work up: No results for input(s): VITAMINB12, FOLATE, FERRITIN, TIBC, IRON, RETICCTPCT in the last 72 hours. Sepsis Labs: Recent Labs  Lab 11/08/21 1949 11/09/21 0521  WBC 9.6 8.9  LATICACIDVEN 1.0  --     Microbiology Recent Results (from the past 240 hour(s))  Culture, blood (routine x 2)     Status: None (Preliminary result)   Collection Time: 11/08/21  7:49 PM   Specimen: BLOOD  Result Value Ref Range Status   Specimen Description   Final    BLOOD  BLOOD LEFT FOREARM Performed at [facility] [address] [place], [place] [address]    Special Requests   Final    BOTTLES DRAWN AEROBIC AND ANAEROBIC Blood Culture results may not be optimal due to an inadequate volume of blood received in culture bottles Performed at [facility] [address], [place], [place] [address]    Culture   Final    NO GROWTH 2 DAYS Performed at [facility] [address] [place], [place] [address]    Report Status PENDING  Incomplete  Culture, blood (routine x 2)     Status: None (Preliminary result)   Collection Time: 11/08/21  7:49 PM   Specimen: BLOOD  Result Value Ref Range Status   Specimen Description   Final    BLOOD BLOOD LEFT WRIST Performed at [facility] [address] [place], [place] [address]    Special Requests   Final    BOTTLES DRAWN AEROBIC AND ANAEROBIC Blood Culture adequate volume Performed at [facility] [address], [place], [place] [address]    Culture   Final    NO GROWTH 2 DAYS Performed at [facility] [address] [place], [place] [address]    Report Status PENDING  Incomplete  MRSA Next Gen by PCR, Nasal     Status: Abnormal   Collection Time: 11/09/21  2:20 AM   Specimen: Nasal Mucosa; Nasal Swab  Result Value Ref Range Status   MRSA by PCR Next Gen DETECTED (A) NOT DETECTED Final    Comment: (NOTE) The GeneXpert MRSA Assay (FDA approved for NASAL specimens only), is one component of a comprehensive MRSA colonization surveillance program. It is not intended to diagnose MRSA infection nor to guide or monitor treatment for MRSA infections. Test performance is not FDA approved in patients less than 43 years old. Performed at [facility], [place] [address] [place], [place] [address]      Medications:    (feeding supplement) PROSource Plus  30 mL Oral BID BM   allopurinol  100 mg Oral Daily   vitamin C  500 mg Oral Daily   aspirin  81 mg Oral Daily   calcium-vitamin D  1 tablet Oral BID   Chlorhexidine Gluconate Cloth  6 each Topical Q0600   cycloSPORINE  1 drop Both Eyes BID   enoxaparin (LOVENOX) injection  40 mg Subcutaneous Q24H   feeding supplement  237 mL Oral BID BM   FLUoxetine  40 mg Oral Daily    gabapentin  600 mg Oral Q8H   magnesium oxide  400 mg Oral Daily   mirtazapine  7.5 mg Oral QHS   mupirocin ointment  1 application. Nasal BID   tiZANidine  2 mg Oral QHS   tiZANidine  4 mg Oral BID   Continuous Infusions:  ceFEPime (MAXIPIME) IV 2 g (11/10/21 0909)   vancomycin 1,250 mg (11/10/21 0123)      LOS: 1 day   Charlynne Cousins  [facility]  11/10/2021, 9:44 AM

## 2021-11-11 ENCOUNTER — Inpatient Hospital Stay: Payer: Self-pay

## 2021-11-11 ENCOUNTER — Encounter (HOSPITAL_COMMUNITY): Payer: Self-pay | Admitting: Internal Medicine

## 2021-11-11 ENCOUNTER — Other Ambulatory Visit: Payer: Self-pay

## 2021-11-11 DIAGNOSIS — L89154 Pressure ulcer of sacral region, stage 4: Secondary | ICD-10-CM | POA: Diagnosis not present

## 2021-11-11 DIAGNOSIS — E440 Moderate protein-calorie malnutrition: Secondary | ICD-10-CM | POA: Insufficient documentation

## 2021-11-11 DIAGNOSIS — M4628 Osteomyelitis of vertebra, sacral and sacrococcygeal region: Secondary | ICD-10-CM | POA: Diagnosis not present

## 2021-11-11 DIAGNOSIS — Z8673 Personal history of transient ischemic attack (TIA), and cerebral infarction without residual deficits: Secondary | ICD-10-CM | POA: Diagnosis not present

## 2021-11-11 DIAGNOSIS — M869 Osteomyelitis, unspecified: Secondary | ICD-10-CM | POA: Diagnosis not present

## 2021-11-11 LAB — CK: Total CK: 14 U/L — ABNORMAL LOW (ref 38–234)

## 2021-11-11 MED ORDER — SODIUM CHLORIDE 0.9% FLUSH
10.0000 mL | INTRAVENOUS | Status: DC | PRN
  Administered 2021-11-11: 10 mL

## 2021-11-11 MED ORDER — PRO-STAT SUGAR FREE PO LIQD
60.0000 mL | Freq: Two times a day (BID) | ORAL | Status: DC
  Filled 2021-11-11: qty 60

## 2021-11-11 MED ORDER — SODIUM CHLORIDE 0.9% FLUSH
10.0000 mL | Freq: Two times a day (BID) | INTRAVENOUS | Status: DC
  Administered 2021-11-11 – 2021-11-12 (×3): 10 mL

## 2021-11-11 MED ORDER — SODIUM CHLORIDE 0.9 % IV SOLN
8.0000 mg/kg | Freq: Every day | INTRAVENOUS | Status: DC
  Administered 2021-11-11 – 2021-11-12 (×2): 450 mg via INTRAVENOUS
  Filled 2021-11-11 (×3): qty 9

## 2021-11-11 MED ORDER — PRO-STAT SUGAR FREE PO LIQD
60.0000 mL | Freq: Two times a day (BID) | ORAL | Status: DC
  Filled 2021-11-11 (×2): qty 60

## 2021-11-11 NOTE — Progress Notes (Signed)
PHARMACY CONSULT NOTE FOR:  OUTPATIENT  PARENTERAL ANTIBIOTIC THERAPY (OPAT)  Indication: Sacral Osteo Regimen: Daptomycin 450 mg IV every 24 hours + Cefepime 2g IV every 8 hours + Metronidazole 500 mg po every 12 hours End date: 12/21/21  IV antibiotic discharge orders are pended. To discharging provider:  please sign these orders via discharge navigator,  Select New Orders & click on the button choice - Manage This Unsigned Work.     Thank you for allowing pharmacy to be a part of this patient's care.  Georgina Pillion, PharmD, BCPS Infectious Diseases Clinical Pharmacist 11/11/2021 10:30 AM   **Pharmacist phone directory can now be found on [email or web address] ([email or web address]).  Listed under [facility].

## 2021-11-11 NOTE — Progress Notes (Signed)
Peripherally Inserted Central Catheter Placement  The IV Nurse has discussed with the patient and/or persons authorized to consent for the patient, the purpose of this procedure and the potential benefits and risks involved with this procedure.  The benefits include less needle sticks, lab draws from the catheter, and the patient may be discharged home with the catheter. Risks include, but not limited to, infection, bleeding, blood clot (thrombus formation), and puncture of an artery; nerve damage and irregular heartbeat and possibility to perform a PICC exchange if needed/ordered by physician.  Alternatives to this procedure were also discussed.  Bard Power PICC patient education guide, fact sheet on infection prevention and patient information card has been provided to patient /or left at bedside.    PICC Placement Documentation  PICC Single Lumen 11/11/21 Left Brachial 38 cm 0 cm (Active)  Indication for Insertion or Continuance of Line Home intravenous therapies (PICC only) 11/11/21 1200  Exposed Catheter (cm) 0 cm 11/11/21 1200  Site Assessment Clean, Dry, Intact 11/11/21 1200  Line Status Flushed;Saline locked;Blood return noted 11/11/21 1200  Dressing Type Transparent;Securing device 11/11/21 1200  Dressing Status Antimicrobial disc in place 11/11/21 1200  Safety Lock Not Applicable 11/11/21 1200  Line Care Connections checked and tightened 11/11/21 1200  Dressing Intervention New dressing 11/11/21 1200  Dressing Change Due 11/18/21 11/11/21 1200       [name] 11/11/2021, 12:50 PM

## 2021-11-11 NOTE — Progress Notes (Addendum)
[facility] PROGRESS NOTE    Progress Note  RAJAE SEALEY  [id number] DOB: [birth year] DOA: 11/08/2021 PCP: Pcp, No     Brief Narrative:   [name] [name] is an 61 y.o. 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 Infectious disease was consulted, she was continued on Dapto and cefepime Flagyl was added. Insert PICC line 11/11/2021. She has remained afebrile culture data has remained negative till date. MRI of the spine distal sacrum osteomyelitis fluid signal tracking along the dorsal sacrum which may reflect an abscess.  No evidence of discitis or osteomyelitis. She will need 6 weeks of IV antibiotics.  Bilateral lower extremity pain: Radiating from the back likely due to his acute decubitus ulcer  History of CVA: Continue aspirin.  Normocytic anemia: Anemia panel will be unreliable. Hemoglobin is stable.  History of chronic pain: Continue gabapentin IV morphine as needed.  History of depression: Continue fluoxetine.  Unstageable sacral decubitus ulcer pressure ulcer present on admission: RN Pressure Injury Documentation: Pressure Injury 11/09/21 Coccyx Unstageable - Full thickness tissue loss in which the base of the injury is covered by slough (yellow, tan, gray, green or brown) and/or eschar (tan, brown or black) in the wound bed. (Active)  11/09/21 0223  Location: Coccyx  Location Orientation:   Staging: Unstageable - Full thickness tissue loss in which the base of the injury is covered by slough (yellow,  tan, gray, green or brown) and/or eschar (tan, brown or black) in the wound bed.  Wound Description (Comments):   Present on Admission: Yes  Dressing Type Foam - Lift dressing to assess site every shift 11/10/21 2110     Pressure Injury 11/09/21 Ankle Left;Medial Unstageable - Full thickness tissue loss in which the base of the injury is covered by slough (yellow, tan, gray, green or brown) and/or eschar (tan, brown or black) in the wound bed. (Active)  11/09/21 0226  Location: Ankle  Location Orientation: Left;Medial  Staging: Unstageable - Full thickness tissue loss in which the base of the injury is covered by slough (yellow, tan, gray, green or brown) and/or eschar (tan, brown or black) in the wound bed.  Wound Description (Comments):   Present on Admission: Yes  Dressing Type Foam - Lift dressing to assess site every shift 11/10/21 2110     Pressure Injury 11/09/21 Ankle Left;Lateral Unstageable - Full thickness tissue loss in which the base of the injury is covered by slough (yellow, tan, gray, green or brown) and/or eschar (tan, brown or black) in the wound bed. (Active)  11/09/21 0226  Location: Ankle  Location Orientation: Left;Lateral  Staging: Unstageable - Full thickness tissue loss in which the base of the injury is covered by slough (yellow, tan, gray, green or brown) and/or eschar (tan, brown or black) in the wound bed.  Wound Description (Comments):   Present on Admission: Yes  Dressing Type Foam - Lift dressing to assess site every shift 11/10/21 2110     Pressure Injury 11/09/21 Buttocks Left Unstageable - Full thickness tissue loss in which the base of the injury is covered by slough (  yellow, tan, gray, green or brown) and/or eschar (tan, brown or black) in the wound bed. (Active)  11/09/21 0043  Location: Buttocks  Location Orientation: Left  Staging: Unstageable - Full thickness tissue loss in which the base of the injury is covered by slough (yellow, tan, gray, green  or brown) and/or eschar (tan, brown or black) in the wound bed.  Wound Description (Comments):   Present on Admission: Yes  Dressing Type Foam - Lift dressing to assess site every shift 11/10/21 2110    Estimated body mass index is 20.8 kg/m as calculated from the following:   Height as of this encounter: 5\' 5"  (1.651 m).   Weight as of this encounter: 56.7 kg.    DVT prophylaxis: lovenox Family Communication:none Status is: Observation Patient is appropriate for inpatient due to acute osteomyelitis with abscess    Code Status:     Code Status Orders  (From admission, onward)           Start     Ordered   11/09/21 0242  Do not attempt resuscitation (DNR)  Continuous       Question Answer Comment  In the event of cardiac or respiratory ARREST Do not call a "code blue"   In the event of cardiac or respiratory ARREST Do not perform Intubation, CPR, defibrillation or ACLS   In the event of cardiac or respiratory ARREST Use medication by any route, position, wound care, and other measures to relive pain and suffering. May use oxygen, suction and manual treatment of airway obstruction as needed for comfort.      11/09/21 0243           Code Status History     Date Active Date Inactive Code Status Order ID Comments User Context   11/09/2021 0053 11/09/2021 0243 DNR [id number]  Rise Patience, MD Inpatient   11/21/2017 1256 11/27/2017 2023 DNR [id number]  Mariel Aloe, MD ED         IV Access:   Peripheral IV   Procedures and diagnostic studies:   MR LUMBAR SPINE WO CONTRAST  Result Date: 11/09/2021 CLINICAL DATA:  Severe lower back pain EXAM: MRI LUMBAR SPINE WITHOUT CONTRAST TECHNIQUE: Multiplanar, multisequence MR imaging of the lumbar spine was performed. No intravenous contrast was administered. COMPARISON:  CT abdomen/pelvis dated 1 day prior FINDINGS: Segmentation: Standard; the lowest formed disc space is designated L5-S1. Alignment:  Normal.  Vertebrae: Vertebral body heights are preserved. Background marrow signal is somewhat heterogeneous throughout without focal suspicious signal abnormality. There is no evidence of discitis/osteomyelitis in the lumbar spine. There is STIR signal abnormality in the distal sacrum consistent with osteomyelitis as seen on the prior CT abdomen/pelvis. There is fluid signal tracking along the dorsal aspect of the sacrum which could reflect phlegmon or abscess. There is no evidence of epidural fluid collection in the lumbar canal. Conus medullaris and cauda equina: Conus extends to the L1-L2 level. Conus and cauda equina appear normal. Paraspinal and other soft tissues: There is edema in the soft tissues overlying the distal sacrum. The paraspinal soft tissues are otherwise unremarkable. Disc levels: Lumbar disc heights are overall preserved. T12-L1: No significant spinal canal or neural foraminal stenosis L1-L2: No significant spinal canal or neural foraminal stenosis L2-L3: Mild facet arthropathy without significant spinal canal or neural foraminal stenosis L3-L4: Mild facet arthropathy without significant spinal canal or neural foraminal stenosis L4-L5: There is a mild disc bulge and mild bilateral facet arthropathy resulting in mild narrowing of  the subarticular zones without evidence of nerve root impingement and mild bilateral neural foraminal stenosis L5-S1: No significant spinal canal or neural foraminal stenosis. IMPRESSION: 1. Signal abnormality in the distal sacrum consistent with osteomyelitis as seen on the prior CT abdomen/pelvis. There is thin fluid signal tracking along the dorsal sacrum which may reflect phlegmon or abscess. 2. No evidence of discitis/osteomyelitis in the lumbar spine, and no evidence of epidural phlegmon/abscess in the lumbar canal. 3. Mild degenerative changes without significant spinal canal or neural foraminal stenosis. Electronically Signed   By: Valetta Mole M.D.   On: 11/09/2021  15:19   VAS [place] ABI WITH/WO TBI  Result Date: 11/09/2021  LOWER EXTREMITY DOPPLER STUDY Patient Name:  KALEIGHA WHAN  Date of Exam:   11/09/2021 Medical Rec #: [id number]       Accession #:    [id number] Date of Birth: July 22, 1960      Patient Gender: F Patient Age:   89 years Exam Location:  [facility] Procedure:      VAS [place] ABI WITH/WO TBI Referring Phys: Gean Birchwood --------------------------------------------------------------------------------  Indications: Chronic leg pain. High Risk Factors: Current smoker, prior CVA.  Limitations: Today's exam was limited due to Patient is contracted and immobile              from previous stroke. Performing Technologist: Rogelia Rohrer RVT, RDMS  Examination Guidelines: A complete evaluation includes at minimum, Doppler waveform signals and systolic blood pressure reading at the level of bilateral brachial, anterior tibial, and posterior tibial arteries, when vessel segments are accessible. Bilateral testing is considered an integral part of a complete examination. Photoelectric Plethysmograph (PPG) waveforms and toe systolic pressure readings are included as required and additional duplex testing as needed. Limited examinations for reoccurring indications may be performed as noted.  ABI Findings: +---------+------------------+-----+---------+----------+ Right    Rt Pressure (mmHg)IndexWaveform Comment    +---------+------------------+-----+---------+----------+ Brachial                                 contracted +---------+------------------+-----+---------+----------+ PTA                                      contracte  +---------+------------------+-----+---------+----------+ DP       93                0.86 triphasic           +---------+------------------+-----+---------+----------+ Great Toe77                0.71 Normal              +---------+------------------+-----+---------+----------+  +---------+------------------+-----+---------+-------+ Left     Lt Pressure (mmHg)IndexWaveform Comment +---------+------------------+-----+---------+-------+ Brachial 108                    triphasic        +---------+------------------+-----+---------+-------+ PTA      112               1.04 triphasic        +---------+------------------+-----+---------+-------+ DP       112               1.04 biphasic         +---------+------------------+-----+---------+-------+ Great Toe84                0.78 Normal           +---------+------------------+-----+---------+-------+ +-------+-----------+-----------+------------+------------+  ABI/TBIToday's ABIToday's TBIPrevious ABIPrevious TBI +-------+-----------+-----------+------------+------------+ Right  0.86       0.71                                +-------+-----------+-----------+------------+------------+ Left   1.04       0.78                                +-------+-----------+-----------+------------+------------+  Summary: Right: Resting right ankle-brachial index indicates mild right lower extremity arterial disease. The right toe-brachial index is normal. Left: Resting left ankle-brachial index is within normal range. No evidence of significant left lower extremity arterial disease. The left toe-brachial index is normal. *See table(s) above for measurements and observations.  Electronically signed by Jamelle Haring on 11/09/2021 at 9:27:56 PM.    Final    [place] EKG SITE RITE  Result Date: 11/11/2021 If Site Rite image not attached, placement could not be confirmed due to current cardiac rhythm.    Medical Consultants:   None.   Subjective:    [name] no complaints  Objective:    Vitals:   11/10/21 0350 11/10/21 1358 11/10/21 2051 11/11/21 0444  BP: (!) 91/58 (!) 85/57 124/81 112/74  Pulse: 70 80 100 77  Resp: 17 14 18 16   Temp: 97.8 F (36.6 C) 98 F (36.7 C) 98.1 F (36.7 C) 98.2 F  (36.8 C)  TempSrc:  Oral Oral   SpO2: 96% 94% 95% 97%  Weight:      Height:       SpO2: 97 %   Intake/Output Summary (Last 24 hours) at 11/11/2021 1025 Last data filed at 11/11/2021 0455 Gross per 24 hour  Intake 870 ml  Output 900 ml  Net -30 ml    Filed Weights   11/08/21 1709  Weight: 56.7 kg    Exam: General exam: In no acute distress. Respiratory system: Good air movement and clear to auscultation. Cardiovascular system: S1 & S2 heard, RRR. No JVD. Gastrointestinal system: Abdomen is nondistended, soft and nontender.  Extremities: No pedal edema. Skin: No rashes, lesions or ulcers Psychiatry: Judgement and insight appear normal. Mood & affect appropriate.   Data Reviewed:    Labs: Basic Metabolic Panel: Recent Labs  Lab 11/08/21 1949 11/09/21 0521 11/10/21 0527  NA 138 139 139  K 3.9 3.6 4.2  CL 103 104 103  CO2 30 28 29   GLUCOSE 120* 112* 108*  BUN 28* 26* 16  CREATININE 0.45 0.40* 0.38*  CALCIUM 8.8* 8.5* 8.6*    GFR Estimated Creatinine Clearance: 67.8 mL/min (A) (by C-G formula based on SCr of 0.38 mg/dL (L)). Liver Function Tests: Recent Labs  Lab 11/09/21 0521  AST 13*  ALT 14  ALKPHOS 51  BILITOT 0.4  PROT 6.1*  ALBUMIN 2.5*    No results for input(s): LIPASE, AMYLASE in the last 168 hours. No results for input(s): AMMONIA in the last 168 hours. Coagulation profile No results for input(s): INR, PROTIME in the last 168 hours. COVID-19 Labs  No results for input(s): DDIMER, FERRITIN, LDH, CRP in the last 72 hours.  Lab Results  Component Value Date   [place] NEGATIVE 09/07/2021    CBC: Recent Labs  Lab 11/08/21 1949 11/09/21 0521  WBC 9.6 8.9  NEUTROABS 5.8 5.2  HGB 10.5* 10.6*  HCT 32.8* 34.3*  MCV 89.1 89.3  PLT 315 322  Cardiac Enzymes: No results for input(s): CKTOTAL, CKMB, CKMBINDEX, TROPONINI in the last 168 hours. BNP (last 3 results) No results for input(s): PROBNP in the last 8760 hours. CBG: No  results for input(s): GLUCAP in the last 168 hours. D-Dimer: No results for input(s): DDIMER in the last 72 hours. Hgb A1c: No results for input(s): HGBA1C in the last 72 hours. Lipid Profile: No results for input(s): CHOL, HDL, LDLCALC, TRIG, CHOLHDL, LDLDIRECT in the last 72 hours. Thyroid function studies: No results for input(s): TSH, T4TOTAL, T3FREE, THYROIDAB in the last 72 hours.  Invalid input(s): FREET3 Anemia work up: No results for input(s): VITAMINB12, FOLATE, FERRITIN, TIBC, IRON, RETICCTPCT in the last 72 hours. Sepsis Labs: Recent Labs  Lab 11/08/21 1949 11/09/21 0521  WBC 9.6 8.9  LATICACIDVEN 1.0  --     Microbiology Recent Results (from the past 240 hour(s))  Culture, blood (routine x 2)     Status: None (Preliminary result)   Collection Time: 11/08/21  7:49 PM   Specimen: BLOOD  Result Value Ref Range Status   Specimen Description   Final    BLOOD BLOOD LEFT FOREARM Performed at [facility], [place] [address] [place], [place] [address]    Special Requests   Final    BOTTLES DRAWN AEROBIC AND ANAEROBIC Blood Culture results may not be optimal due to an inadequate volume of blood received in culture bottles Performed at [facility] [address] [place], [place] [address]    Culture   Final    NO GROWTH 3 DAYS Performed at [facility], [place] [address] [place], [place] [address]    Report Status PENDING  Incomplete  Culture, blood (routine x 2)     Status: None (Preliminary result)   Collection Time: 11/08/21  7:49 PM   Specimen: BLOOD  Result Value Ref Range Status   Specimen Description   Final    BLOOD BLOOD LEFT WRIST Performed at [facility] [address], [place], [place] [address]    Special Requests   Final    BOTTLES DRAWN AEROBIC AND ANAEROBIC Blood Culture adequate volume Performed at [facility] [address] [place], [place] [address]     Culture   Final    NO GROWTH 3 DAYS Performed at [facility], [place] [address], [place],  [address]    Report Status PENDING  Incomplete  MRSA Next Gen by PCR, Nasal     Status: Abnormal   Collection Time: 11/09/21  2:20 AM   Specimen: Nasal Mucosa; Nasal Swab  Result Value Ref Range Status   MRSA by PCR Next Gen DETECTED (A) NOT DETECTED Final    Comment: (NOTE) The GeneXpert MRSA Assay (FDA approved for NASAL specimens only), is one component of a comprehensive MRSA colonization surveillance program. It is not intended to diagnose MRSA infection nor to guide or monitor treatment for MRSA infections. Test performance is not FDA approved in patients less than 1 years old. Performed at [facility], [place] [address] [place], [place] [address]      Medications:    (feeding supplement) PROSource Plus  30 mL Oral BID BM   allopurinol  100 mg Oral Daily   vitamin C  500 mg Oral Daily   aspirin  81 mg Oral Daily   calcium-vitamin D  1 tablet Oral BID   Chlorhexidine Gluconate Cloth  6 each Topical Q0600   cycloSPORINE  1 drop Both Eyes BID   enoxaparin (LOVENOX) injection  40 mg Subcutaneous Q24H   feeding supplement  1 Container Oral BID BM   feeding supplement  237 mL Oral BID BM   FLUoxetine  40 mg Oral Daily   gabapentin  600 mg Oral Q8H   magnesium oxide  400 mg Oral Daily   metroNIDAZOLE  500 mg Oral Q12H   mirtazapine  7.5 mg Oral QHS   multivitamin with minerals  1 tablet Oral Daily   mupirocin ointment  1 application. Nasal BID   tiZANidine  2 mg Oral QHS   tiZANidine  4 mg Oral BID   Continuous Infusions:  ceFEPime (MAXIPIME) IV 2 g (11/11/21 0054)   vancomycin 1,250 mg (11/10/21 2133)      LOS: 2 days   Charlynne Cousins  [facility]  11/11/2021, 10:25 AM

## 2021-11-11 NOTE — Progress Notes (Signed)
[place] for Infectious Disease  Date of Admission:  11/08/2021           Reason for visit: Follow up on Sacral OM                                       Current antibiotics: Vancomycin Cefepime Flagyl  ASSESSMENT:    61 y.o. 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  She had MRI confirming sacral infection on 11/09/21.  Blood cultures are negative and she is hemodynamically stable.  IR evaluated for possible abscess/fluid collection but she did not have anything amenable to drainage. History of CVA: Patient has residual right sided deficits and is largely bedbound subsequent to her CVA. Multiple pressure injuries: Seen by WOC this admission. Protein calorie malnutrition: Albumin is 2.5.  RECOMMENDATIONS:    Continue antibiotics and will switch from vancomycin to daptomycin.  Continue cefepime and flagyl PICC line OPAT below Continue wound care Nutrition, offloading, and keeping wounds free or urine and stool are also keys to sacral wound care Please call as needed  Diagnosis: Sacral OM  Culture Result: None  Allergies  Allergen Reactions   Ativan [Lorazepam]    Bactrim [Sulfamethoxazole-Trimethoprim]     Not On MAR   Paxil [Paroxetine Hcl]     Not on [facility]    OPAT Orders Discharge antibiotics to be given via PICC line Discharge antibiotics: Per pharmacy protocol  Daptomycin 450 mg IV every 24 hours + Cefepime 2g IV every 8 hours + Metronidazole 500 mg po every 12 hours  Duration: 6 weeks  End Date: 6/28  [facility] Care Per Protocol:  Home health RN for IV administration and teaching; PICC line care and labs.    Labs weekly while on IV antibiotics: _xx_ CBC with differential __ BMP _xx_ CMP _xx_ CRP _xx_ ESR __ Vancomycin trough _xx_ CK  _xx_ Please pull PIC at completion of IV antibiotics __ Please leave PIC in place until doctor has seen patient or been notified  Fax weekly labs to  [phone]  Clinic Follow Up Appt: 12/20/21 at 11:15 with Juleen China    Principal Problem:   Sacral osteomyelitis ([place]) Active Problems:   History of CVA (cerebrovascular accident)   Anemia   Decubitus ulcer of sacral region   Osteomyelitis ([place])   Malnutrition of moderate degree    MEDICATIONS:    Scheduled Meds:  (feeding supplement) PROSource Plus  30 mL Oral BID BM   allopurinol  100 mg Oral Daily   vitamin C  500 mg Oral Daily   aspirin  81 mg Oral Daily   calcium-vitamin D  1 tablet Oral BID   Chlorhexidine Gluconate Cloth  6 each Topical Q0600   cycloSPORINE  1 drop Both Eyes BID   enoxaparin (LOVENOX) injection  40 mg Subcutaneous Q24H   feeding supplement  1 Container Oral BID BM   feeding supplement  237 mL Oral BID BM   FLUoxetine  40 mg Oral Daily   gabapentin  600 mg Oral Q8H   magnesium oxide  400 mg Oral Daily   metroNIDAZOLE  500 mg Oral Q12H   mirtazapine  7.5 mg Oral QHS   multivitamin with minerals  1 tablet Oral Daily   mupirocin ointment  1 application. Nasal BID   tiZANidine  2 mg Oral QHS   tiZANidine  4 mg Oral BID   Continuous Infusions:  ceFEPime (MAXIPIME) IV 2 g (11/11/21 0054)   DAPTOmycin (CUBICIN)  IV     PRN Meds:.acetaminophen **OR** acetaminophen, hydrocortisone cream, melatonin, morphine injection, polyvinyl alcohol  SUBJECTIVE:   24 hour events:  No events   No new complaints No fevers Frustrated that she has this sacral wound and infection Continued pain  Review of Systems  All other systems reviewed and are negative.    OBJECTIVE:   Blood pressure 112/74, pulse 77, temperature 98.2 F (36.8 C), resp. rate 16, height _0  (1.651 m), weight 56.7 kg, SpO2 97 %. Body mass index is 20.8 kg/m.  Physical Exam Constitutional:      General: She is not in acute distress.    Appearance: Normal appearance.  HENT:     Head: Normocephalic and atraumatic.  Eyes:     Extraocular Movements: Extraocular movements intact.      Conjunctiva/sclera: Conjunctivae normal.  Abdominal:     General: There is no distension.     Palpations: Abdomen is soft.  Musculoskeletal:     Cervical back: Normal range of motion and neck supple.     Comments: Right UE and LE contractures.   Skin:    General: Skin is warm and dry.  Neurological:     General: No focal deficit present.     Mental Status: She is alert and oriented to person, place, and time.  Psychiatric:        Mood and Affect: Mood normal.        Behavior: Behavior normal.     Lab Results: Lab Results  Component Value Date   WBC 8.9 11/09/2021   HGB 10.6 (L) 11/09/2021   HCT 34.3 (L) 11/09/2021   MCV 89.3 11/09/2021   PLT 322 11/09/2021    Lab Results  Component Value Date   NA 139 11/10/2021   K 4.2 11/10/2021   CO2 29 11/10/2021   GLUCOSE 108 (H) 11/10/2021   BUN 16 11/10/2021   CREATININE 0.38 (L) 11/10/2021   CALCIUM 8.6 (L) 11/10/2021   GFRNONAA >60 11/10/2021   GFRAA >60 11/27/2017    Lab Results  Component Value Date   ALT 14 11/09/2021   AST 13 (L) 11/09/2021   ALKPHOS 51 11/09/2021   BILITOT 0.4 11/09/2021    No results found for: CRP     Component Value Date/Time   ESRSEDRATE 17 11/26/2012 1307     I have reviewed the micro and lab results in Epic.  Imaging: MR LUMBAR SPINE WO CONTRAST  Result Date: 11/09/2021 CLINICAL DATA:  Severe lower back pain EXAM: MRI LUMBAR SPINE WITHOUT CONTRAST TECHNIQUE: Multiplanar, multisequence MR imaging of the lumbar spine was performed. No intravenous contrast was administered. COMPARISON:  CT abdomen/pelvis dated 1 day prior FINDINGS: Segmentation: Standard; the lowest formed disc space is designated L5-S1. Alignment:  Normal. Vertebrae: Vertebral body heights are preserved. Background marrow signal is somewhat heterogeneous throughout without focal suspicious signal abnormality. There is no evidence of discitis/osteomyelitis in the lumbar spine. There is STIR signal abnormality in the  distal sacrum consistent with osteomyelitis as seen on the prior CT abdomen/pelvis. There is fluid signal tracking along the dorsal aspect of the sacrum which could reflect phlegmon or abscess. There is no evidence of epidural fluid collection in the lumbar canal. Conus medullaris and cauda equina: Conus extends to the L1-L2 level. Conus and cauda equina appear normal. Paraspinal and other soft tissues: There is edema in the soft  tissues overlying the distal sacrum. The paraspinal soft tissues are otherwise unremarkable. Disc levels: Lumbar disc heights are overall preserved. T12-L1: No significant spinal canal or neural foraminal stenosis L1-L2: No significant spinal canal or neural foraminal stenosis L2-L3: Mild facet arthropathy without significant spinal canal or neural foraminal stenosis L3-L4: Mild facet arthropathy without significant spinal canal or neural foraminal stenosis L4-L5: There is a mild disc bulge and mild bilateral facet arthropathy resulting in mild narrowing of the subarticular zones without evidence of nerve root impingement and mild bilateral neural foraminal stenosis L5-S1: No significant spinal canal or neural foraminal stenosis. IMPRESSION: 1. Signal abnormality in the distal sacrum consistent with osteomyelitis as seen on the prior CT abdomen/pelvis. There is thin fluid signal tracking along the dorsal sacrum which may reflect phlegmon or abscess. 2. No evidence of discitis/osteomyelitis in the lumbar spine, and no evidence of epidural phlegmon/abscess in the lumbar canal. 3. Mild degenerative changes without significant spinal canal or neural foraminal stenosis. Electronically Signed   By: Valetta Mole M.D.   On: 11/09/2021 15:19   VAS [place] ABI WITH/WO TBI  Result Date: 11/09/2021  LOWER EXTREMITY DOPPLER STUDY Patient Name:  [name] [name]  Date of Exam:   11/09/2021 Medical Rec #: [id number]       Accession #:    [id number] Date of Birth: 08-Oct-1960      Patient Gender: F Patient Age:    13 years Exam Location:  [facility] Procedure:      VAS [place] ABI WITH/WO TBI Referring Phys: Gean Birchwood --------------------------------------------------------------------------------  Indications: Chronic leg pain. High Risk Factors: Current smoker, prior CVA.  Limitations: Today's exam was limited due to Patient is contracted and immobile              from previous stroke. Performing Technologist: Rogelia Rohrer RVT, RDMS  Examination Guidelines: A complete evaluation includes at minimum, Doppler waveform signals and systolic blood pressure reading at the level of bilateral brachial, anterior tibial, and posterior tibial arteries, when vessel segments are accessible. Bilateral testing is considered an integral part of a complete examination. Photoelectric Plethysmograph (PPG) waveforms and toe systolic pressure readings are included as required and additional duplex testing as needed. Limited examinations for reoccurring indications may be performed as noted.  ABI Findings: +---------+------------------+-----+---------+----------+ Right    Rt Pressure (mmHg)IndexWaveform Comment    +---------+------------------+-----+---------+----------+ Brachial                                 contracted +---------+------------------+-----+---------+----------+ PTA                                      contracte  +---------+------------------+-----+---------+----------+ DP       93                0.86 triphasic           +---------+------------------+-----+---------+----------+ Great Toe77                0.71 Normal              +---------+------------------+-----+---------+----------+ +---------+------------------+-----+---------+-------+ Left     Lt Pressure (mmHg)IndexWaveform Comment +---------+------------------+-----+---------+-------+ Brachial 108                    triphasic        +---------+------------------+-----+---------+-------+ PTA      112  1.04 triphasic        +---------+------------------+-----+---------+-------+ DP       112               1.04 biphasic         +---------+------------------+-----+---------+-------+ Great Toe84                0.78 Normal           +---------+------------------+-----+---------+-------+ +-------+-----------+-----------+------------+------------+ ABI/TBIToday's ABIToday's TBIPrevious ABIPrevious TBI +-------+-----------+-----------+------------+------------+ Right  0.86       0.71                                +-------+-----------+-----------+------------+------------+ Left   1.04       0.78                                +-------+-----------+-----------+------------+------------+  Summary: Right: Resting right ankle-brachial index indicates mild right lower extremity arterial disease. The right toe-brachial index is normal. Left: Resting left ankle-brachial index is within normal range. No evidence of significant left lower extremity arterial disease. The left toe-brachial index is normal. *See table(s) above for measurements and observations.  Electronically signed by Jamelle Haring on 11/09/2021 at 9:27:56 PM.    Final    [place] EKG SITE RITE  Result Date: 11/11/2021 If Site Rite image not attached, placement could not be confirmed due to current cardiac rhythm.    Imaging independently reviewed in Epic.    Raynelle High[place] for Infectious Disease [facility] [phone] pager 11/11/2021, 10:34 AM

## 2021-11-11 NOTE — TOC Initial Note (Signed)
Transition of Care [facility]) - Initial/Assessment Note    Patient Details  Name: [name] [name] MRN: [id number] Date of Birth: Aug 28, 1960  Transition of Care [facility]) CM/SW Contact:    Bethann Berkshire, [place] Phone Number: 11/11/2021, 12:40 PM  Clinical Narrative:                  CSW met with pt and confirmed she was from [facility] for LTC. Pt requests to discharge on Monday. CSW explained that MD would determine DC date and that anticipated DC is for tomorrow. CSW confirmed with [facility] place that pt can return tomorrow with PICC for Daptomycin 450 mg/24h and Cefepime 2g/8h for 6 weeks. CSW completed fl2 and faxed in hub.   Expected Discharge Plan: Skilled Nursing Facility Barriers to Discharge: Continued Medical Work up   Patient Goals and CMS Choice        Expected Discharge Plan and Services Expected Discharge Plan: [place]       Living arrangements for the past 2 months: [place]                                      Prior Living Arrangements/Services Living arrangements for the past 2 months: [place] Lives with:: Facility Resident Patient language and need for interpreter reviewed:: Yes        Need for Family Participation in Patient Care: No (Comment) Care giver support system in place?: Yes (comment)   Criminal Activity/Legal Involvement Pertinent to Current Situation/Hospitalization: No - Comment as needed  Activities of Daily Living Home Assistive Devices/Equipment: Wheelchair ADL Screening (condition at time of admission) Patient's cognitive ability adequate to safely complete daily activities?: Yes Is the patient deaf or have difficulty hearing?: No Does the patient have difficulty seeing, even when wearing glasses/contacts?: Yes Does the patient have difficulty concentrating, remembering, or making decisions?: Yes Patient able to express need for assistance with ADLs?: Yes Does the patient have  difficulty dressing or bathing?: Yes Independently performs ADLs?: No Communication: Independent Dressing (OT): Dependent Is this a change from baseline?: Pre-admission baseline Grooming: Dependent Is this a change from baseline?: Pre-admission baseline Feeding: Independent Bathing: Needs assistance Is this a change from baseline?: Pre-admission baseline Toileting: Dependent Is this a change from baseline?: Pre-admission baseline In/Out Bed: Dependent Is this a change from baseline?: Pre-admission baseline Walks in Home: Dependent Is this a change from baseline?: Pre-admission baseline Does the patient have difficulty walking or climbing stairs?: Yes Weakness of Legs: Both Weakness of Arms/Hands: Right  Permission Sought/Granted                  Emotional Assessment Appearance:: Appears stated age Attitude/Demeanor/Rapport: Lethargic Affect (typically observed): Appropriate Orientation: : Oriented to Self, Oriented to Place, Oriented to Situation Alcohol / Substance Use: Not Applicable Psych Involvement: No (comment)  Admission diagnosis:  Sacral osteomyelitis ([place]) [M46.28] Osteomyelitis of other site, unspecified type ([place]) [M86.9] Decubitus ulcer of sacral region [L89.159] Osteomyelitis [facility]) [M86.9] Patient Active Problem List   Diagnosis Date Noted   Malnutrition of moderate degree 11/11/2021   Anemia 11/09/2021   Decubitus ulcer of sacral region 11/09/2021   Osteomyelitis ([place]) 11/09/2021   Sacral osteomyelitis ([place]) 11/08/2021   Altered mental status, unspecified 11/21/2017   Tobacco abuse 11/21/2017   Alcohol abuse 11/21/2017   History of CVA (cerebrovascular accident) 11/21/2017   Acute lower UTI 11/21/2017  Female stress incontinence 10/10/2012   Urinary frequency 10/10/2012   Urinary tract infection, site not specified 09/26/2012   PCP:  Pcp, No Pharmacy:   [facility], [place] [address] [place] [address] Phone: [phone] Fax: [phone]     Social Determinants of Health (SDOH) Interventions    Readmission Risk Interventions     View : No data to display.

## 2021-11-11 NOTE — NC FL2 (Signed)
[place] MEDICAID FL2 LEVEL OF CARE SCREENING TOOL     IDENTIFICATION  Patient Name: [name] [name] Birthdate: [birth year] Sex: female Admission Date (Current Location): 11/08/2021  [facility] and [place] Number:  Herbalist and Address:  [facility] [place] [address], [place], [place] [address]      Provider Number: [id number]  Attending Physician Name and Address:  Aileen Fass, Tammi Klippel, MD  Relative Name and Phone Number:       Current Level of Care: SNF Recommended Level of Care: [place] Prior Approval Number:    Date Approved/Denied:   PASRR Number: [id number]  Discharge Plan: SNF    Current Diagnoses: Patient Active Problem List   Diagnosis Date Noted   Malnutrition of moderate degree 11/11/2021   Anemia 11/09/2021   Decubitus ulcer of sacral region 11/09/2021   Osteomyelitis ([place]) 11/09/2021   Sacral osteomyelitis ([place]) 11/08/2021   Altered mental status, unspecified 11/21/2017   Tobacco abuse 11/21/2017   Alcohol abuse 11/21/2017   History of CVA (cerebrovascular accident) 11/21/2017   Acute lower UTI 11/21/2017   Female stress incontinence 10/10/2012   Urinary frequency 10/10/2012   Urinary tract infection, site not specified 09/26/2012    Orientation RESPIRATION BLADDER Height & Weight     Self, Situation, Place  Normal Incontinent, Indwelling catheter Weight: 125 lb (56.7 kg) Height:  5\' 5"  (165.1 cm)  BEHAVIORAL SYMPTOMS/MOOD NEUROLOGICAL BOWEL NUTRITION STATUS      Incontinent Diet (See d/c summary)  AMBULATORY STATUS COMMUNICATION OF NEEDS Skin   Extensive Assist Verbally PU Stage and Appropriate Care (Pressure injury, buttocks left unstageable; pressure injury coccyx Unstageable; Pressure injury left ankle medial unstageable; pressure injry left ankle lateral unstageable)                       Personal Care Assistance Level of Assistance  Bathing, Feeding, Dressing Bathing Assistance:  Limited assistance Feeding assistance: Independent Dressing Assistance: Limited assistance     Functional Limitations Info  Sight, Hearing, Speech Sight Info: Impaired Hearing Info: Impaired Speech Info: Adequate    SPECIAL CARE FACTORS FREQUENCY  PT (By licensed PT), OT (By licensed OT)     PT Frequency: 2-3x/week OT Frequency: 2-3x/week            Contractures Contractures Info: Not present    Additional Factors Info  Code Status, Allergies Code Status Info: DNR Allergies Info: Ativan (lorazepam), Bactrim (sulfamethoxazole-trimethoprim), Paxil (paroxetine Hcl)           Current Medications (11/11/2021):  This is the current hospital active medication list Current Facility-Administered Medications  Medication Dose Route Frequency Provider Last Rate Last Admin   (feeding supplement) PROSource Plus liquid 30 mL  30 mL Oral BID BM Charlynne Cousins, MD   30 mL at 11/11/21 0903   acetaminophen (TYLENOL) tablet 650 mg  650 mg Oral Q6H PRN Rise Patience, MD   650 mg at 11/10/21 1520   Or   acetaminophen (TYLENOL) suppository 650 mg  650 mg Rectal Q6H PRN Rise Patience, MD       allopurinol (ZYLOPRIM) tablet 100 mg  100 mg Oral Daily Rise Patience, MD   100 mg at 11/11/21 [id number]   ascorbic acid (VITAMIN C) tablet 500 mg  500 mg Oral Daily Rise Patience, MD   500 mg at 11/11/21 [id number]   aspirin chewable tablet 81 mg  81 mg Oral Daily Rise Patience, MD  81 mg at 11/11/21 0902   calcium-vitamin D (OSCAL WITH D) 500-5 MG-MCG per tablet 1 tablet  1 tablet Oral BID Rise Patience, MD   1 tablet at 11/11/21 0902   ceFEPIme (MAXIPIME) 2 g in sodium chloride 0.9 % 100 mL IVPB  2 g Intravenous Q8H Angela Adam, RPH 200 mL/hr at 11/11/21 1238 2 g at 11/11/21 1238   Chlorhexidine Gluconate Cloth 2 % PADS 6 each  6 each Topical Q0600 Rise Patience, MD   6 each at 11/11/21 1239   cycloSPORINE (RESTASIS) 0.05 % ophthalmic emulsion 1 drop  1  drop Both Eyes BID Rise Patience, MD   1 drop at 11/11/21 [id number]   DAPTOmycin (CUBICIN) 450 mg in sodium chloride 0.9 % IVPB  8 mg/kg Intravenous Q2000 Jule Ser N, DO       enoxaparin (LOVENOX) injection 40 mg  40 mg Subcutaneous Q24H Rise Patience, MD   40 mg at 11/11/21 0901   feeding supplement (BOOST / RESOURCE BREEZE) liquid 1 Container  1 Container Oral BID BM Charlynne Cousins, MD   1 Container at 11/10/21 2127   feeding supplement (ENSURE ENLIVE / ENSURE PLUS) liquid 237 mL  237 mL Oral BID BM Rise Patience, MD   237 mL at 11/11/21 0903   feeding supplement (PRO-STAT SUGAR FREE 64) liquid 60 mL  60 mL Oral BID Charlynne Cousins, MD       FLUoxetine (PROZAC) capsule 40 mg  40 mg Oral Daily Rise Patience, MD   40 mg at 11/11/21 0902   gabapentin (NEURONTIN) capsule 600 mg  600 mg Oral Q8H Rise Patience, MD   600 mg at 11/11/21 0630   hydrocortisone cream 1 % 1 application.  1 application. Topical TID PRN Charlynne Cousins, MD   1 application. at 11/10/21 2138   magnesium oxide (MAG-OX) tablet 400 mg  400 mg Oral Daily Rise Patience, MD   400 mg at 11/11/21 [id number]   melatonin tablet 3 mg  3 mg Oral QHS PRN Rise Patience, MD       metroNIDAZOLE (FLAGYL) tablet 500 mg  500 mg Oral Q12H Mignon Pine, DO   500 mg at 11/11/21 0630   mirtazapine (REMERON) tablet 7.5 mg  7.5 mg Oral QHS Rise Patience, MD   7.5 mg at 11/10/21 2111   morphine (PF) 2 MG/ML injection 2 mg  2 mg Intravenous Q2H PRN Charlynne Cousins, MD   2 mg at 11/11/21 0900   multivitamin with minerals tablet 1 tablet  1 tablet Oral Daily Charlynne Cousins, MD   1 tablet at 11/11/21 0902   mupirocin ointment (BACTROBAN) 2 % 1 application.  1 application. Nasal BID Rise Patience, MD   1 application. at 11/11/21 0902   polyvinyl alcohol (LIQUIFILM TEARS) 1.4 % ophthalmic solution 2 drop  2 drop Both Eyes Q4H PRN Rise Patience, MD       tiZANidine  (ZANAFLEX) tablet 2 mg  2 mg Oral QHS Rise Patience, MD   2 mg at 11/10/21 2111   tiZANidine (ZANAFLEX) tablet 4 mg  4 mg Oral BID Rise Patience, MD   4 mg at 11/11/21 0901     Discharge Medications: Please see discharge summary for a list of discharge medications.  Relevant Imaging Results:  Relevant Lab Results:   Additional Information SSN   [id number]  Bethann Berkshire, LCSW

## 2021-11-12 DIAGNOSIS — Z8673 Personal history of transient ischemic attack (TIA), and cerebral infarction without residual deficits: Secondary | ICD-10-CM

## 2021-11-12 DIAGNOSIS — M4628 Osteomyelitis of vertebra, sacral and sacrococcygeal region: Secondary | ICD-10-CM | POA: Diagnosis not present

## 2021-11-12 DIAGNOSIS — E440 Moderate protein-calorie malnutrition: Secondary | ICD-10-CM

## 2021-11-12 DIAGNOSIS — L89154 Pressure ulcer of sacral region, stage 4: Secondary | ICD-10-CM | POA: Diagnosis not present

## 2021-11-12 LAB — LACTIC ACID, PLASMA: Lactic Acid, Venous: 0.6 mmol/L (ref 0.5–1.9)

## 2021-11-12 MED ORDER — SODIUM CHLORIDE 0.9 % IV BOLUS
1000.0000 mL | Freq: Once | INTRAVENOUS | Status: AC
  Administered 2021-11-12: 1000 mL via INTRAVENOUS

## 2021-11-12 MED ORDER — SODIUM CHLORIDE 0.9 % IV BOLUS
500.0000 mL | Freq: Once | INTRAVENOUS | Status: AC
  Administered 2021-11-12: 500 mL via INTRAVENOUS

## 2021-11-12 NOTE — TOC Progression Note (Signed)
Transition of Care [facility]) - Progression Note    Patient Details  Name: [name] [name] MRN: [id number] Date of Birth: 10-Jul-1960  Transition of Care [facility]) CM/SW Contact  Ross Ludwig, [place] Phone Number: 11/12/2021, 12:19 PM  Clinical Narrative:     CSW spoke to [place] at [facility] and they said they can accept patient tomorrow if she is medically ready for discharge.  CSW updated attending physician and bedside nurse.  TOC to continue to follow patient's progress throughout discharge planning.   Expected Discharge Plan: [place] Barriers to Discharge: Continued Medical Work up  Expected Discharge Plan and Services Expected Discharge Plan: [place] arrangements for the past 2 months: [place]                                       Social Determinants of Health (SDOH) Interventions    Readmission Risk Interventions     View : No data to display.

## 2021-11-12 NOTE — Progress Notes (Signed)
[facility] PROGRESS NOTE    Progress Note  AARVI [name]  [id number] DOB: 1960-11-22 DOA: 11/08/2021 PCP: Pcp, No     Brief Narrative:   [name] [name] is an 61 y.o. 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 Infectious disease was consulted, she was continued on Dapto and cefepime Flagyl was added. Insert PICC line 11/11/2021. Has remained afebrile. We will go to skilled nursing facility with 6 weeks of antibiotics. She is nauseated this morning, will use Zofran. She will probably be discharged once her nausea is controlled.  Bilateral lower extremity pain: Radiating from the back likely due to his acute decubitus ulcer  History of CVA: Continue aspirin.  Normocytic anemia: Anemia panel will be unreliable. Hemoglobin is stable.  History of chronic pain: Continue gabapentin IV morphine as needed.  History of depression: Continue fluoxetine.  Unstageable sacral decubitus ulcer pressure ulcer present on admission: RN Pressure Injury Documentation: Pressure Injury 11/09/21 Coccyx Unstageable - Full thickness tissue loss in which the base of the injury is covered by slough (yellow, tan, gray, green or brown) and/or eschar (tan, brown or black) in the wound bed. (Active)  11/09/21 0223  Location: Coccyx  Location Orientation:   Staging: Unstageable - Full thickness tissue loss in which the base of the injury is covered by slough (yellow, tan, gray, green or brown) and/or eschar (tan, brown or black) in the wound  bed.  Wound Description (Comments):   Present on Admission: Yes  Dressing Type Foam - Lift dressing to assess site every shift 11/11/21 1920     Pressure Injury 11/09/21 Ankle Left;Medial Unstageable - Full thickness tissue loss in which the base of the injury is covered by slough (yellow, tan, gray, green or brown) and/or eschar (tan, brown or black) in the wound bed. (Active)  11/09/21 0226  Location: Ankle  Location Orientation: Left;Medial  Staging: Unstageable - Full thickness tissue loss in which the base of the injury is covered by slough (yellow, tan, gray, green or brown) and/or eschar (tan, brown or black) in the wound bed.  Wound Description (Comments):   Present on Admission: Yes  Dressing Type Foam - Lift dressing to assess site every shift 11/11/21 1920     Pressure Injury 11/09/21 Ankle Left;Lateral Unstageable - Full thickness tissue loss in which the base of the injury is covered by slough (yellow, tan, gray, green or brown) and/or eschar (tan, brown or black) in the wound bed. (Active)  11/09/21 0226  Location: Ankle  Location Orientation: Left;Lateral  Staging: Unstageable - Full thickness tissue loss in which the base of the injury is covered by slough (yellow, tan, gray, green or brown) and/or eschar (tan, brown or black) in the wound bed.  Wound Description (Comments):   Present on Admission: Yes  Dressing Type Foam - Lift dressing to assess site every shift 11/11/21 1920     Pressure Injury 11/09/21 Buttocks Left Unstageable - Full thickness tissue loss in which the base of the injury is covered by slough (yellow, tan, gray, green or brown) and/or eschar (tan, brown or black)  in the wound bed. (Active)  11/09/21 0043  Location: Buttocks  Location Orientation: Left  Staging: Unstageable - Full thickness tissue loss in which the base of the injury is covered by slough (yellow, tan, gray, green or brown) and/or eschar (tan, brown or black) in the wound bed.  Wound  Description (Comments):   Present on Admission: Yes  Dressing Type Foam - Lift dressing to assess site every shift 11/11/21 1920    Estimated body mass index is 20.8 kg/m as calculated from the following:   Height as of this encounter: 5\' 5"  (1.651 m).   Weight as of this encounter: 56.7 kg.    DVT prophylaxis: lovenox Family Communication:none Status is: Observation Patient is appropriate for inpatient due to acute osteomyelitis with abscess    Code Status:     Code Status Orders  (From admission, onward)           Start     Ordered   11/09/21 0242  Do not attempt resuscitation (DNR)  Continuous       Question Answer Comment  In the event of cardiac or respiratory ARREST Do not call a "code blue"   In the event of cardiac or respiratory ARREST Do not perform Intubation, CPR, defibrillation or ACLS   In the event of cardiac or respiratory ARREST Use medication by any route, position, wound care, and other measures to relive pain and suffering. May use oxygen, suction and manual treatment of airway obstruction as needed for comfort.      11/09/21 0243           Code Status History     Date Active Date Inactive Code Status Order ID Comments User Context   11/09/2021 0053 11/09/2021 0243 DNR [id number]  Rise Patience, MD Inpatient   11/21/2017 1256 11/27/2017 2023 DNR [id number]  Mariel Aloe, MD ED         IV Access:   Peripheral IV   Procedures and diagnostic studies:   [place] EKG SITE RITE  Result Date: 11/11/2021 If Site Rite image not attached, placement could not be confirmed due to current cardiac rhythm.    Medical Consultants:   None.   Subjective:    [name] has been complaining of nausea.  Objective:    Vitals:   11/12/21 0353 11/12/21 0553 11/12/21 0654 11/12/21 0758  BP: (!) 74/43 (!) 86/56 (!) 104/58 95/64  Pulse: 62 69 72 85  Resp: 18 18  16   Temp: 98.1 F (36.7 C) (!) 97.5 F (36.4 C)  99.1 F (37.3 C)   TempSrc: Oral Oral  Oral  SpO2: 94%   95%  Weight:      Height:       SpO2: 95 %   Intake/Output Summary (Last 24 hours) at 11/12/2021 0911 Last data filed at 11/12/2021 0500 Gross per 24 hour  Intake 300 ml  Output 900 ml  Net -600 ml    Filed Weights   11/08/21 1709  Weight: 56.7 kg    Exam: General exam: In no acute distress. Respiratory system: Good air movement and clear to auscultation. Cardiovascular system: S1 & S2 heard, RRR. No JVD. Gastrointestinal system: Abdomen is nondistended, soft and nontender.  Extremities: No pedal edema. Skin: No rashes, lesions or ulcers  Data Reviewed:    Labs: Basic Metabolic Panel: Recent Labs  Lab 11/08/21 1949 11/09/21 0521 11/10/21 0527  NA 138 139 139  K 3.9 3.6 4.2  CL 103 104 103  CO2 30 28 29   GLUCOSE 120* 112* 108*  BUN 28* 26* 16  CREATININE 0.45 0.40* 0.38*  CALCIUM 8.8* 8.5* 8.6*    GFR Estimated Creatinine Clearance: 67.8 mL/min (A) (by C-G formula based on SCr of 0.38 mg/dL (L)). Liver Function Tests: Recent Labs  Lab 11/09/21 0521  AST 13*  ALT 14  ALKPHOS 51  BILITOT 0.4  PROT 6.1*  ALBUMIN 2.5*    No results for input(s): LIPASE, AMYLASE in the last 168 hours. No results for input(s): AMMONIA in the last 168 hours. Coagulation profile No results for input(s): INR, PROTIME in the last 168 hours. COVID-19 Labs  No results for input(s): DDIMER, FERRITIN, LDH, CRP in the last 72 hours.  Lab Results  Component Value Date   [place] NEGATIVE 09/07/2021    CBC: Recent Labs  Lab 11/08/21 1949 11/09/21 0521  WBC 9.6 8.9  NEUTROABS 5.8 5.2  HGB 10.5* 10.6*  HCT 32.8* 34.3*  MCV 89.1 89.3  PLT 315 322    Cardiac Enzymes: Recent Labs  Lab 11/11/21 1105  CKTOTAL 14*   BNP (last 3 results) No results for input(s): PROBNP in the last 8760 hours. CBG: No results for input(s): GLUCAP in the last 168 hours. D-Dimer: No results for input(s): DDIMER in the last 72 hours. Hgb  A1c: No results for input(s): HGBA1C in the last 72 hours. Lipid Profile: No results for input(s): CHOL, HDL, LDLCALC, TRIG, CHOLHDL, LDLDIRECT in the last 72 hours. Thyroid function studies: No results for input(s): TSH, T4TOTAL, T3FREE, THYROIDAB in the last 72 hours.  Invalid input(s): FREET3 Anemia work up: No results for input(s): VITAMINB12, FOLATE, FERRITIN, TIBC, IRON, RETICCTPCT in the last 72 hours. Sepsis Labs: Recent Labs  Lab 11/08/21 1949 11/09/21 0521 11/12/21 0528  WBC 9.6 8.9  --   LATICACIDVEN 1.0  --  0.6    Microbiology Recent Results (from the past 240 hour(s))  Culture, blood (routine x 2)     Status: None (Preliminary result)   Collection Time: 11/08/21  7:49 PM   Specimen: BLOOD  Result Value Ref Range Status   Specimen Description   Final    BLOOD BLOOD LEFT FOREARM Performed at [facility], [place] [address] [place], [place] [address]    Special Requests   Final    BOTTLES DRAWN AEROBIC AND ANAEROBIC Blood Culture results may not be optimal due to an inadequate volume of blood received in culture bottles Performed at [place] [address] [place], [place] [address]    Culture   Final    NO GROWTH 4 DAYS Performed at [facility], [place] [address], [place], [place] [address]    Report Status PENDING  Incomplete  Culture, blood (routine x 2)     Status: None (Preliminary result)   Collection Time: 11/08/21  7:49 PM   Specimen: BLOOD  Result Value Ref Range Status   Specimen Description   Final    BLOOD BLOOD LEFT WRIST Performed at [place] [address], [place], [place] [address]    Special Requests   Final    BOTTLES DRAWN AEROBIC AND ANAEROBIC Blood Culture adequate volume Performed at [place] [address], [place], [place] [address]    Culture   Final    NO GROWTH 4 DAYS Performed at [facility], [place] [address], [place],  [place] [address]    Report Status PENDING  Incomplete  MRSA  Next Gen by PCR, Nasal     Status: Abnormal   Collection Time: 11/09/21  2:20 AM   Specimen: Nasal Mucosa; Nasal Swab  Result Value Ref Range Status   MRSA by PCR Next Gen DETECTED (A) NOT DETECTED Final    Comment: (NOTE) The GeneXpert MRSA Assay (FDA approved for NASAL specimens only), is one component of a comprehensive MRSA colonization surveillance program. It is not intended to diagnose MRSA infection nor to guide or monitor treatment for MRSA infections. Test performance is not FDA approved in patients less than 36 years old. Performed at [facility], [place] [address] [place], [place] [address]      Medications:    (feeding supplement) PROSource Plus  30 mL Oral BID BM   allopurinol  100 mg Oral Daily   vitamin C  500 mg Oral Daily   aspirin  81 mg Oral Daily   calcium-vitamin D  1 tablet Oral BID   Chlorhexidine Gluconate Cloth  6 each Topical Q0600   cycloSPORINE  1 drop Both Eyes BID   enoxaparin (LOVENOX) injection  40 mg Subcutaneous Q24H   feeding supplement  1 Container Oral BID BM   feeding supplement  237 mL Oral BID BM   FLUoxetine  40 mg Oral Daily   gabapentin  600 mg Oral Q8H   magnesium oxide  400 mg Oral Daily   metroNIDAZOLE  500 mg Oral Q12H   mirtazapine  7.5 mg Oral QHS   multivitamin with minerals  1 tablet Oral Daily   mupirocin ointment  1 application. Nasal BID   sodium chloride flush  10-40 mL Intracatheter Q12H   tiZANidine  2 mg Oral QHS   tiZANidine  4 mg Oral BID   Continuous Infusions:  ceFEPime (MAXIPIME) IV 2 g (11/11/21 2341)   DAPTOmycin (CUBICIN)  IV 450 mg (11/11/21 1327)      LOS: 3 days   Charlynne Cousins  [facility]  11/12/2021, 9:11 AM

## 2021-11-12 NOTE — Progress Notes (Signed)
Patient denies being nauseated. No orders for Zofran or other antiemetic. eMAR review shows no antiemetic needed overnight. MD notified and aware.

## 2021-11-12 NOTE — Progress Notes (Signed)
   11/12/21 0353  Assess: MEWS Score  Temp 98.1 F (36.7 C)  BP (!) 74/43  Pulse Rate 62  Resp 18  SpO2 94 %  O2 Device Room Air  Assess: MEWS Score  MEWS Temp 0  MEWS Systolic 2  MEWS Pulse 0  MEWS RR 0  MEWS LOC 0  MEWS Score 2  MEWS Score Color Yellow  Assess: if the MEWS score is Yellow or Red  Were vital signs taken at a resting state? Yes  Focused Assessment No change from prior assessment  Does the patient meet 2 or more of the SIRS criteria? Yes  Does the patient have a confirmed or suspected source of infection? Yes  Provider and Rapid Response Notified? Yes  MEWS guidelines implemented *See Row Information* Yes  Treat  MEWS Interventions Escalated (See documentation below)  Pain Scale 0-10  Pain Score 0  Take Vital Signs  Increase Vital Sign Frequency  Yellow: Q 2hr X 2 then Q 4hr X 2, if remains yellow, continue Q 4hrs  Escalate  MEWS: Escalate Yellow: discuss with charge nurse/RN and consider discussing with provider and RRT  Notify: Charge Nurse/RN  Name of Charge Nurse/RN Notified Jerilynn Som, RN  Date Charge Nurse/RN Notified 11/12/21  Time Charge Nurse/RN Notified 0355  Notify: Provider  Provider Name/Title Dr.Kakrakandy  Date Provider Notified 11/12/21  Time Provider Notified [phone]  Method of Notification Page  Notification Reason Other (Comment) (BP 74/43; mews yellow)  Provider response Other (Comment) (awaiting response)  Document  Patient Outcome Not stable and remains on department  Progress note created (see row info) Yes  Assess: SIRS CRITERIA  SIRS Temperature  0  SIRS Pulse 0  SIRS Respirations  0  SIRS WBC 0  SIRS Score Sum  0   Pt yellow mews d/t low BP. MD and Charge RN notified and made aware. See new orders. Yellow mews protocol initiated. Plan of care ongoing.

## 2021-11-13 DIAGNOSIS — M8609 Acute hematogenous osteomyelitis, multiple sites: Secondary | ICD-10-CM | POA: Diagnosis not present

## 2021-11-13 DIAGNOSIS — M869 Osteomyelitis, unspecified: Secondary | ICD-10-CM | POA: Diagnosis not present

## 2021-11-13 DIAGNOSIS — M4628 Osteomyelitis of vertebra, sacral and sacrococcygeal region: Secondary | ICD-10-CM | POA: Diagnosis not present

## 2021-11-13 DIAGNOSIS — L89154 Pressure ulcer of sacral region, stage 4: Secondary | ICD-10-CM | POA: Diagnosis not present

## 2021-11-13 LAB — CULTURE, BLOOD (ROUTINE X 2)
Culture: NO GROWTH
Culture: NO GROWTH
Special Requests: ADEQUATE

## 2021-11-13 LAB — LACTIC ACID, PLASMA: Lactic Acid, Venous: 1.3 mmol/L (ref 0.5–1.9)

## 2021-11-13 MED ORDER — LACTATED RINGERS IV BOLUS
500.0000 mL | Freq: Once | INTRAVENOUS | Status: AC
  Administered 2021-11-13: 500 mL via INTRAVENOUS

## 2021-11-13 MED ORDER — METRONIDAZOLE 500 MG PO TABS: 500.0000 mg | ORAL_TABLET | Freq: Two times a day (BID) | ORAL | 0 refills | Status: AC

## 2021-11-13 MED ORDER — HEPARIN SOD (PORK) LOCK FLUSH 100 UNIT/ML IV SOLN
250.0000 [IU] | INTRAVENOUS | Status: AC | PRN
  Administered 2021-11-13: 250 [IU]

## 2021-11-13 MED ORDER — CEFEPIME IV (FOR PTA / DISCHARGE USE ONLY): 2.0000 g | Freq: Three times a day (TID) | INTRAVENOUS | 0 refills | Status: AC

## 2021-11-13 MED ORDER — DAPTOMYCIN IV (FOR PTA / DISCHARGE USE ONLY): 450.0000 mg | INTRAVENOUS | 0 refills | Status: AC

## 2021-11-13 NOTE — Discharge Summary (Addendum)
Physician Discharge Summary  [name] [name] OTR:[id number] DOB: 09-23-1960 DOA: 11/08/2021  PCP: Merryl Hacker, No  Admit date: 11/08/2021 Discharge date: 11/13/2021  Admitted From: SNF Disposition:  SNF  Recommendations for Outpatient Follow-up:  Follow up with PCP in 1-2 weeks Please obtain BMP/CBC in one week She will go to skilled nursing facility on IV antibiotics for 6 weeks his PICC line was inserted on 11/12/2021.   Home Health:No Equipment/Devices:None  Discharge Condition:Stable CODE STATUS:Full Diet recommendation: Heart Healthy  Brief/Interim Summary:  61 y.o. 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 PICC line was inserted on 11/11/2021. She remained afebrile culture data remain negative. She will go back to skilled nursing facility with 6 weeks of IV antibiotics.  Bilateral lower extremity pain: Likely radiating from t sickle decubitus ulcer wound care was consulted continue daily dressing as per wound care.  History of CVA: Continue aspirin.  Normocytic anemia: Hemoglobin has remained stable.  History  of chronic pain: Continue gabapentin no changes made to her medication.  History of depression: Continue fluoxetine.  Unstageable sacral decubitus ulcer present on admission: Wound care was consulted recommended dressing changes.  Discharge Instructions  Discharge Instructions     [facility] pharmacist to adjust dose for Vancomycin, Aminoglycosides and other anti-infective therapies as requested by physician.   Complete by: As directed    [facility] to provide Cath Flo 2mg    Complete by: As directed    Administer for PICC line occlusion and as ordered by physician for other access device issues.   Anaphylaxis Kit: Provided to treat any anaphylactic reaction to the medication being provided to the patient if First Dose or when requested by physician   Complete by: As directed    Epinephrine 1mg /ml vial / amp: Administer 0.3mg  (0.80ml) subcutaneously once for moderate to severe anaphylaxis, nurse to call physician and pharmacy when reaction occurs and call 911 if needed for immediate care   Diphenhydramine 50mg /ml IV vial: Administer 25-50mg  IV/IM PRN for first dose reaction, rash, itching, mild reaction, nurse to call physician and pharmacy when reaction occurs   Sodium Chloride 0.9% NS 572ml IV: Administer if needed for hypovolemic blood pressure drop or as ordered by physician after call to physician with anaphylactic reaction   Change dressing on IV access line weekly and PRN   Complete by: As directed    Diet - low sodium heart healthy   Complete by: As directed    Discharge wound care:   Complete by: As directed    Per wound care instructions.   Flush IV access with Sodium Chloride 0.9% and Heparin 10 units/ml or 100 units/ml   Complete by: As directed  Home infusion instructions - [facility]   Complete by: As directed    Instructions: Flush IV access with Sodium Chloride 0.9% and Heparin 10units/ml or 100units/ml   Change dressing on IV  access line: Weekly and PRN   Instructions Cath Flo $Remove'2mg'Pzfyzxb$ : Administer for PICC Line occlusion and as ordered by physician for other access device   [facility] pharmacist to adjust dose for: Vancomycin, Aminoglycosides and other anti-infective therapies as requested by physician   Increase activity slowly   Complete by: As directed    Method of administration may be changed at the discretion of home infusion pharmacist based upon assessment of the patient and/or caregiver's ability to self-administer the medication ordered   Complete by: As directed       Allergies as of 11/13/2021       Reactions   Ativan [lorazepam]    Bactrim [sulfamethoxazole-trimethoprim]    Not On MAR   Paxil [paroxetine Hcl]    Not on [facility]        Medication List     STOP taking these medications    pregabalin 50 MG capsule Commonly known as: LYRICA       TAKE these medications    allopurinol 100 MG tablet Commonly known as: ZYLOPRIM Take 100 mg by mouth daily.   aspirin 81 MG tablet Take 81 mg by mouth daily.   calcium-vitamin D 500-5 MG-MCG tablet Commonly known as: OSCAL WITH D Take 1 tablet by mouth 2 (two) times daily.   ceFEPime  IVPB Commonly known as: MAXIPIME Inject 2 g into the vein every 8 (eight) hours. Indication:  Sacral Osteo First Dose: Yes Last Day of Therapy:  12/21/21 Labs - Once weekly:  CBC/D and BMP, Labs - Every other week:  ESR and CRP Method of administration: IV Push Method of administration may be changed at the discretion of home infusion pharmacist based upon assessment of the patient and/or caregiver's ability to self-administer the medication ordered.   cholecalciferol 25 MCG (1000 UNIT) tablet Commonly known as: VITAMIN D Take 1,000 Units by mouth daily.   cycloSPORINE 0.05 % ophthalmic emulsion Commonly known as: RESTASIS Place 1 drop into both eyes 2 (two) times daily.   daptomycin  IVPB Commonly known as: CUBICIN Inject 450 mg into the  vein daily. Indication:  Sacral Osteo First Dose: Yes Last Day of Therapy:  12/21/21 Labs - Once weekly:  CBC/D, BMP, and CPK Labs - Every other week:  ESR and CRP Method of administration: IV Push Method of administration may be changed at the discretion of home infusion pharmacist based upon assessment of the patient and/or caregiver's ability to self-administer the medication ordered.   diclofenac Sodium 1 % Gel Commonly known as: VOLTAREN Apply 2 g topically every 6 (six) hours as needed (for pain).   FLUoxetine 40 MG capsule Commonly known as: PROZAC Take 40 mg by mouth daily.   gabapentin 600 MG tablet Commonly known as: NEURONTIN Take 600 mg by mouth every 8 (eight) hours.   Hypromellose 0.4 % Soln Place 2 drops into both eyes every 4 (four) hours as needed (dry eyes).   ibuprofen 400 MG tablet Commonly known as: ADVIL Take 800 mg by mouth every 6 (six) hours as needed (for pain).   LACTOBACILLUS PO Take 1 capsule by mouth 2 (two) times daily.   loperamide 2 MG capsule Commonly known as: IMODIUM Take 2 mg by mouth every 6 (six) hours as needed for diarrhea or loose stools.  magnesium oxide 400 MG tablet Commonly known as: MAG-OX Take 400 mg by mouth daily.   melatonin 3 MG Tabs tablet Take 3 mg by mouth at bedtime as needed (for insomnia).   metroNIDAZOLE 500 MG tablet Commonly known as: FLAGYL Take 1 tablet (500 mg total) by mouth 2 (two) times daily. Stop date 12/21/21 when IV antibiotics are completed   mirtazapine 7.5 MG tablet Commonly known as: REMERON Take 7.5 mg by mouth at bedtime.   PRO-STAT PO Take 30 mLs by mouth 2 (two) times daily.   sennosides-docusate sodium 8.6-50 MG tablet Commonly known as: SENOKOT-S Take 2 tablets by mouth daily as needed for constipation.   tiZANidine 2 MG tablet Commonly known as: ZANAFLEX Take 2 mg by mouth at bedtime. Then give with $RemoveB'4mg'gFcqfHlQ$  tablet at bedtime for a total dose of $Remov'6mg'xiNjly$  at midnight   tiZANidine 4 MG  tablet Commonly known as: ZANAFLEX Take 4 mg by mouth 2 (two) times daily. Also give $RemoveB'4mg'pFFwGhvA$  by mouth along with $RemoveBe'2mg'EcyxfYJnV$  for a total dose of $Remov'6mg'ADSiYi$  at midnight   vitamin C 500 MG tablet Commonly known as: ASCORBIC ACID Take 500 mg by mouth daily.   Zinc Oxide 10 % Oint Apply 1 application. topically in the morning and at bedtime. Apply to face topically every day and evening               Discharge Care Instructions  (From admission, onward)           Start     Ordered   11/13/21 0000  Change dressing on IV access line weekly and PRN  (Home infusion instructions - [facility] )        11/13/21 0845   11/13/21 0000  Discharge wound care:       Comments: Per wound care instructions.   11/13/21 0845            Allergies  Allergen Reactions   Ativan [Lorazepam]    Bactrim [Sulfamethoxazole-Trimethoprim]     Not On MAR   Paxil [Paroxetine Hcl]     Not on MAR    Consultations: Infectious disease   Procedures/Studies: MR LUMBAR SPINE WO CONTRAST  Result Date: 11/09/2021 CLINICAL DATA:  Severe lower back pain EXAM: MRI LUMBAR SPINE WITHOUT CONTRAST TECHNIQUE: Multiplanar, multisequence MR imaging of the lumbar spine was performed. No intravenous contrast was administered. COMPARISON:  CT abdomen/pelvis dated 1 day prior FINDINGS: Segmentation: Standard; the lowest formed disc space is designated L5-S1. Alignment:  Normal. Vertebrae: Vertebral body heights are preserved. Background marrow signal is somewhat heterogeneous throughout without focal suspicious signal abnormality. There is no evidence of discitis/osteomyelitis in the lumbar spine. There is STIR signal abnormality in the distal sacrum consistent with osteomyelitis as seen on the prior CT abdomen/pelvis. There is fluid signal tracking along the dorsal aspect of the sacrum which could reflect phlegmon or abscess. There is no evidence of epidural fluid collection in the lumbar canal. Conus medullaris and cauda  equina: Conus extends to the L1-L2 level. Conus and cauda equina appear normal. Paraspinal and other soft tissues: There is edema in the soft tissues overlying the distal sacrum. The paraspinal soft tissues are otherwise unremarkable. Disc levels: Lumbar disc heights are overall preserved. T12-L1: No significant spinal canal or neural foraminal stenosis L1-L2: No significant spinal canal or neural foraminal stenosis L2-L3: Mild facet arthropathy without significant spinal canal or neural foraminal stenosis L3-L4: Mild facet arthropathy without significant spinal canal or neural foraminal stenosis L4-L5: There is  a mild disc bulge and mild bilateral facet arthropathy resulting in mild narrowing of the subarticular zones without evidence of nerve root impingement and mild bilateral neural foraminal stenosis L5-S1: No significant spinal canal or neural foraminal stenosis. IMPRESSION: 1. Signal abnormality in the distal sacrum consistent with osteomyelitis as seen on the prior CT abdomen/pelvis. There is thin fluid signal tracking along the dorsal sacrum which may reflect phlegmon or abscess. 2. No evidence of discitis/osteomyelitis in the lumbar spine, and no evidence of epidural phlegmon/abscess in the lumbar canal. 3. Mild degenerative changes without significant spinal canal or neural foraminal stenosis. Electronically Signed   By: Valetta Mole M.D.   On: 11/09/2021 15:19   CT ABDOMEN PELVIS W CONTRAST  Result Date: 11/08/2021 CLINICAL DATA:  Decubitus ulcer. EXAM: CT ABDOMEN AND PELVIS WITH CONTRAST TECHNIQUE: Multidetector CT imaging of the abdomen and pelvis was performed using the standard protocol following bolus administration of intravenous contrast. RADIATION DOSE REDUCTION: This exam was performed according to the departmental dose-optimization program which includes automated exposure control, adjustment of the mA and/or kV according to patient size and/or use of iterative reconstruction technique.  CONTRAST:  135mL OMNIPAQUE IOHEXOL 300 MG/ML  SOLN COMPARISON:  06/24/2021 FINDINGS: Lower Chest: No acute findings. Hepatobiliary:  No hepatic masses identified. Pancreas:  No mass or inflammatory changes. Spleen: Within normal limits in size and appearance. Adrenals/Urinary Tract: No masses identified. A few tiny benign renal cysts are noted bilaterally (no followup imaging is recommended). A 2 mm calculus is noted in the midpole the right kidney. No evidence of ureteral calculi or hydronephrosis. Bladder is distended. Lower portion of bladder is not visualized due to severe artifact from bilateral hip prostheses, however at least 2 calculi are seen along the left lateral bladder wall, largest measuring 6 mm. Stomach/Bowel: No evidence of obstruction, inflammatory process or abnormal fluid collections. Vascular/Lymphatic: No pathologically enlarged lymph nodes. No acute vascular findings. Aortic atherosclerotic calcification incidentally noted. Reproductive:  No mass or other significant abnormality. Other:  None. Musculoskeletal: A decubitus ulcer is seen overlying the distal sacrum and coccyx. No evidence of soft tissue abscess. Osteolysis is seen at the junction of the sacrum and coccyx, consistent with osteomyelitis (see sagittal images 70-76/series 6). No other suspicious bone lesions identified. IMPRESSION: Decubitus ulcer, with osteomyelitis at the junction of the sacrum and coccyx. No evidence of soft tissue abscess. Distended urinary bladder, with at least 2 bladder calculi measuring up to 6 mm. Tiny nonobstructing right renal calculus. No evidence of ureteral calculi or hydronephrosis. Aortic Atherosclerosis (ICD10-I70.0). Electronically Signed   By: Marlaine Hind M.D.   On: 11/08/2021 21:50   DG Foot 2 Views Left  Result Date: 11/08/2021 CLINICAL DATA:  Pressure wound left heel. EXAM: LEFT FOOT - 2 VIEW COMPARISON:  None Available. FINDINGS: The bones are diffusely osteopenic. There is soft tissue  swelling of the forefoot. There is no radiopaque foreign body. There is no acute fracture or dislocation. No cortical erosions are identified. There are mild degenerative changes of the first metatarsophalangeal joint with hallux valgus. IMPRESSION: 1. No acute bony abnormality. 2. Soft tissue swelling of the forefoot. 3. Diffuse osteopenia. 4. Mild degenerative changes of the first metatarsophalangeal joint with hallux valgus. Electronically Signed   By: Ronney Asters M.D.   On: 11/08/2021 20:08   VAS [place] ABI WITH/WO TBI  Result Date: 11/09/2021  LOWER EXTREMITY DOPPLER STUDY Patient Name:  [name] [name]  Date of Exam:   11/09/2021 Medical Rec #: [id number]  Accession #:    [id number] Date of Birth: 07-Mar-1961      Patient Gender: F Patient Age:   24 years Exam Location:  [facility] Procedure:      VAS [place] ABI WITH/WO TBI Referring Phys: Gean Birchwood --------------------------------------------------------------------------------  Indications: Chronic leg pain. High Risk Factors: Current smoker, prior CVA.  Limitations: Today's exam was limited due to Patient is contracted and immobile              from previous stroke. Performing Technologist: Rogelia Rohrer RVT, RDMS  Examination Guidelines: A complete evaluation includes at minimum, Doppler waveform signals and systolic blood pressure reading at the level of bilateral brachial, anterior tibial, and posterior tibial arteries, when vessel segments are accessible. Bilateral testing is considered an integral part of a complete examination. Photoelectric Plethysmograph (PPG) waveforms and toe systolic pressure readings are included as required and additional duplex testing as needed. Limited examinations for reoccurring indications may be performed as noted.  ABI Findings: +---------+------------------+-----+---------+----------+ Right    Rt Pressure (mmHg)IndexWaveform Comment    +---------+------------------+-----+---------+----------+  Brachial                                 contracted +---------+------------------+-----+---------+----------+ PTA                                      contracte  +---------+------------------+-----+---------+----------+ DP       93                0.86 triphasic           +---------+------------------+-----+---------+----------+ Great Toe77                0.71 Normal              +---------+------------------+-----+---------+----------+ +---------+------------------+-----+---------+-------+ Left     Lt Pressure (mmHg)IndexWaveform Comment +---------+------------------+-----+---------+-------+ Brachial 108                    triphasic        +---------+------------------+-----+---------+-------+ PTA      112               1.04 triphasic        +---------+------------------+-----+---------+-------+ DP       112               1.04 biphasic         +---------+------------------+-----+---------+-------+ Great Toe84                0.78 Normal           +---------+------------------+-----+---------+-------+ +-------+-----------+-----------+------------+------------+ ABI/TBIToday's ABIToday's TBIPrevious ABIPrevious TBI +-------+-----------+-----------+------------+------------+ Right  0.86       0.71                                +-------+-----------+-----------+------------+------------+ Left   1.04       0.78                                +-------+-----------+-----------+------------+------------+  Summary: Right: Resting right ankle-brachial index indicates mild right lower extremity arterial disease. The right toe-brachial index is normal. Left: Resting left ankle-brachial index is within normal range. No evidence of significant left lower extremity arterial disease. The left toe-brachial index is normal. *  See table(s) above for measurements and observations.  Electronically signed by Jamelle Haring on 11/09/2021 at 9:27:56 PM.    Final    [place] EKG  SITE RITE  Result Date: 11/11/2021 If Site Rite image not attached, placement could not be confirmed due to current cardiac rhythm.  (Echo, Carotid, EGD, Colonoscopy, ERCP)    Subjective: No complaints  Discharge Exam: Vitals:   11/13/21 0410 11/13/21 0555  BP: (!) 77/46 (!) 99/58  Pulse: 61 73  Resp: 14 20  Temp: 98.2 F (36.8 C) 98.5 F (36.9 C)  SpO2: 96% 94%   Vitals:   11/12/21 1546 11/12/21 1959 11/13/21 0410 11/13/21 0555  BP: (!) 97/51 129/71 (!) 77/46 (!) 99/58  Pulse: 75 91 61 73  Resp: $Remo'16 14 14 20  'ItKCq$ Temp: 98.2 F (36.8 C) 99.8 F (37.7 C) 98.2 F (36.8 C) 98.5 F (36.9 C)  TempSrc: Oral Oral Oral Oral  SpO2: 95% 96% 96% 94%  Weight:      Height:        General: Pt is alert, awake, not in acute distress Cardiovascular: RRR, S1/S2 +, no rubs, no gallops Respiratory: CTA bilaterally, no wheezing, no rhonchi Abdominal: Soft, NT, ND, bowel sounds + Extremities: no edema, no cyanosis    The results of significant diagnostics from this hospitalization (including imaging, microbiology, ancillary and laboratory) are listed below for reference.     Microbiology: Recent Results (from the past 240 hour(s))  Culture, blood (routine x 2)     Status: None   Collection Time: 11/08/21  7:49 PM   Specimen: BLOOD  Result Value Ref Range Status   Specimen Description   Final    BLOOD BLOOD LEFT FOREARM Performed at [facility] [address] [place], [place] [address]    Special Requests   Final    BOTTLES DRAWN AEROBIC AND ANAEROBIC Blood Culture results may not be optimal due to an inadequate volume of blood received in culture bottles Performed at [facility] [address] [place], [place] [address]    Culture   Final    NO GROWTH 5 DAYS Performed at [facility], [place] [address], [place], [place] [address]    Report Status 11/13/2021 FINAL  Final  Culture, blood (routine x 2)     Status: None   Collection  Time: 11/08/21  7:49 PM   Specimen: BLOOD  Result Value Ref Range Status   Specimen Description   Final    BLOOD BLOOD LEFT WRIST Performed at [facility] [address] [place], [place] [address]    Special Requests   Final    BOTTLES DRAWN AEROBIC AND ANAEROBIC Blood Culture adequate volume Performed at [facility] [address] [place], [place] [address]    Culture   Final    NO GROWTH 5 DAYS Performed at [facility], [place] [address] [place], [place] [address]    Report Status 11/13/2021 FINAL  Final  MRSA Next Gen by PCR, Nasal     Status: Abnormal   Collection Time: 11/09/21  2:20 AM   Specimen: Nasal Mucosa; Nasal Swab  Result Value Ref Range Status   MRSA by PCR Next Gen DETECTED (A) NOT DETECTED Final    Comment: (NOTE) The GeneXpert MRSA Assay (FDA approved for NASAL specimens only), is one component of a comprehensive MRSA colonization surveillance program. It is not intended to diagnose MRSA infection nor to guide or monitor  treatment for MRSA infections. Test performance is not FDA approved in patients less than 86 years old. Performed at [facility], [place] [address] [place], [place] [address]      Labs: BNP (last 3 results) No results for input(s): BNP in the last 8760 hours. Basic Metabolic Panel: Recent Labs  Lab 11/08/21 1949 11/09/21 0521 11/10/21 0527  NA 138 139 139  K 3.9 3.6 4.2  CL 103 104 103  CO2 $Re'30 28 29  'Lkj$ GLUCOSE 120* 112* 108*  BUN 28* 26* 16  CREATININE 0.45 0.40* 0.38*  CALCIUM 8.8* 8.5* 8.6*   Liver Function Tests: Recent Labs  Lab 11/09/21 0521  AST 13*  ALT 14  ALKPHOS 51  BILITOT 0.4  PROT 6.1*  ALBUMIN 2.5*   No results for input(s): LIPASE, AMYLASE in the last 168 hours. No results for input(s): AMMONIA in the last 168 hours. CBC: Recent Labs  Lab 11/08/21 1949 11/09/21 0521  WBC 9.6 8.9  NEUTROABS 5.8 5.2  HGB 10.5* 10.6*  HCT 32.8* 34.3*   MCV 89.1 89.3  PLT 315 322   Cardiac Enzymes: Recent Labs  Lab 11/11/21 1105  CKTOTAL 14*   BNP: Invalid input(s): POCBNP CBG: No results for input(s): GLUCAP in the last 168 hours. D-Dimer No results for input(s): DDIMER in the last 72 hours. Hgb A1c No results for input(s): HGBA1C in the last 72 hours. Lipid Profile No results for input(s): CHOL, HDL, LDLCALC, TRIG, CHOLHDL, LDLDIRECT in the last 72 hours. Thyroid function studies No results for input(s): TSH, T4TOTAL, T3FREE, THYROIDAB in the last 72 hours.  Invalid input(s): FREET3 Anemia work up No results for input(s): VITAMINB12, FOLATE, FERRITIN, TIBC, IRON, RETICCTPCT in the last 72 hours. Urinalysis    Component Value Date/Time   COLORURINE YELLOW 11/09/2021 1830   APPEARANCEUR HAZY (A) 11/09/2021 1830   APPEARANCEUR Hazy 11/26/2012 1642   LABSPEC 1.031 (H) 11/09/2021 1830   LABSPEC 1.024 11/26/2012 1642   PHURINE 6.0 11/09/2021 1830   GLUCOSEU NEGATIVE 11/09/2021 1830   GLUCOSEU Negative 11/26/2012 1642   [place] 11/09/2021 1830   BILIRUBINUR NEGATIVE 11/09/2021 1830   BILIRUBINUR Negative 11/26/2012 1642   KETONESUR 5 (A) 11/09/2021 1830   PROTEINUR NEGATIVE 11/09/2021 1830   UROBILINOGEN negative 10/10/2012 1633   UROBILINOGEN 1.0 05/08/2008 1210   NITRITE POSITIVE (A) 11/09/2021 1830   LEUKOCYTESUR SMALL (A) 11/09/2021 1830   LEUKOCYTESUR Negative 11/26/2012 1642   Sepsis Labs Invalid input(s): PROCALCITONIN,  WBC,  LACTICIDVEN Microbiology Recent Results (from the past 240 hour(s))  Culture, blood (routine x 2)     Status: None   Collection Time: 11/08/21  7:49 PM   Specimen: BLOOD  Result Value Ref Range Status   Specimen Description   Final    BLOOD BLOOD LEFT FOREARM Performed at [facility], [place] [address], [place], [place] [address]    Special Requests   Final    BOTTLES DRAWN AEROBIC AND ANAEROBIC Blood Culture results may not be optimal due to an inadequate  volume of blood received in culture bottles Performed at [facility] [address], [place], [place] [address]    Culture   Final    NO GROWTH 5 DAYS Performed at [facility], [place] [address], [place],  [address]    Report Status 11/13/2021 FINAL  Final  Culture, blood (routine x 2)     Status: None   Collection Time: 11/08/21  7:49 PM   Specimen:  BLOOD  Result Value Ref Range Status   Specimen Description   Final    BLOOD BLOOD LEFT WRIST Performed at [facility] [address], [place], [place] [address]    Special Requests   Final    BOTTLES DRAWN AEROBIC AND ANAEROBIC Blood Culture adequate volume Performed at [facility] [address], [place], [place] [address]    Culture   Final    NO GROWTH 5 DAYS Performed at [facility], [place] [address] [place], [place] [address]    Report Status 11/13/2021 FINAL  Final  MRSA Next Gen by PCR, Nasal     Status: Abnormal   Collection Time: 11/09/21  2:20 AM   Specimen: Nasal Mucosa; Nasal Swab  Result Value Ref Range Status   MRSA by PCR Next Gen DETECTED (A) NOT DETECTED Final    Comment: (NOTE) The GeneXpert MRSA Assay (FDA approved for NASAL specimens only), is one component of a comprehensive MRSA colonization surveillance program. It is not intended to diagnose MRSA infection nor to guide or monitor treatment for MRSA infections. Test performance is not FDA approved in patients less than 66 years old. Performed at [facility], [place] [address], [place], [place] [address]       SIGNED:   Charlynne Cousins, MD  [facility] 11/13/2021, 9:34 AM Pager   If 7PM-7AM, please contact night-coverage [email or web address] Password [email or web address]

## 2021-11-13 NOTE — TOC Transition Note (Addendum)
Transition of Care [facility]) - CM/SW Discharge Note   Patient Details  Name: [name] [name] MRN: [id number] Date of Birth: 05-13-1961  [facility] CM/SW Contact:  Ross Ludwig, LCSW Phone Number: 11/13/2021, 12:00 PM   Clinical Narrative:     Patient to be d/c'ed today to [facility] ([facility]) room 158A.  Patient and family agreeable to plans will transport via ems RN to call report to [phone].  CSW attempted to call family member Nira Conn, unable to leave a message.     Final next level of care: [place] Barriers to Discharge: Barriers Resolved   Patient Goals and CMS Choice Patient states their goals for this hospitalization and ongoing recovery are:: To return to [facility] ([facility]) CMS Medicare.gov Compare Post Acute Care list provided to:: Patient Choice offered to / list presented to : Patient  Discharge Placement   Existing PASRR number confirmed : 11/11/21            Patient to be transferred to facility by: [facility] Name of family member notified: Attempted to call sister in law Nira Conn, unable to leave a message Patient and family notified of of transfer: 11/13/21  Discharge Plan and Services                                     Social Determinants of Health (SDOH) Interventions     Readmission Risk Interventions     View : No data to display.

## 2021-11-13 NOTE — Progress Notes (Signed)
Durenda Guthrie to be D/C'd Skilled nursing facility per MD order.  Discussed with the patient and all questions fully answered.  An After Visit Summary and prescriptions were printed and given to the [facility] for receiving facility .  Report called to Maryruth Hancock, LPN.  Patient instructed to return to ED, call 911, or call MD for any changes in condition.   Patient escorted via stretcher, and D/C to [facility] via non emergency ambulance.  Pauletta Browns 11/13/2021 12:46 PM

## 2021-12-20 ENCOUNTER — Ambulatory Visit (INDEPENDENT_AMBULATORY_CARE_PROVIDER_SITE_OTHER): Payer: Medicaid Other | Admitting: Internal Medicine

## 2021-12-20 ENCOUNTER — Other Ambulatory Visit: Payer: Self-pay

## 2021-12-20 ENCOUNTER — Encounter: Payer: Self-pay | Admitting: Internal Medicine

## 2021-12-20 ENCOUNTER — Telehealth: Payer: Self-pay

## 2021-12-20 VITALS — BP 109/78 | HR 89 | Temp 97.8°F

## 2021-12-20 DIAGNOSIS — M4628 Osteomyelitis of vertebra, sacral and sacrococcygeal region: Secondary | ICD-10-CM | POA: Diagnosis present

## 2021-12-20 NOTE — Telephone Encounter (Signed)
Verbal orders given to [place], Charity fundraiser at [employer], per Dr.Wallace - Pull PICC line after lase dose of abx's on 12/21/2021. Karin Golden repeated orders back to me and verbalized her understanding. Office note from today's visit also sent to facility   [facility], CMA

## 2022-01-02 ENCOUNTER — Encounter (HOSPITAL_BASED_OUTPATIENT_CLINIC_OR_DEPARTMENT_OTHER): Payer: Medicaid Other | Attending: General Surgery | Admitting: General Surgery

## 2022-01-02 DIAGNOSIS — E440 Moderate protein-calorie malnutrition: Secondary | ICD-10-CM | POA: Diagnosis not present

## 2022-01-02 DIAGNOSIS — L89522 Pressure ulcer of left ankle, stage 2: Secondary | ICD-10-CM | POA: Diagnosis not present

## 2022-01-02 DIAGNOSIS — M4628 Osteomyelitis of vertebra, sacral and sacrococcygeal region: Secondary | ICD-10-CM | POA: Diagnosis not present

## 2022-01-02 DIAGNOSIS — F17210 Nicotine dependence, cigarettes, uncomplicated: Secondary | ICD-10-CM | POA: Insufficient documentation

## 2022-01-02 DIAGNOSIS — L89523 Pressure ulcer of left ankle, stage 3: Secondary | ICD-10-CM | POA: Diagnosis not present

## 2022-01-02 DIAGNOSIS — L89154 Pressure ulcer of sacral region, stage 4: Secondary | ICD-10-CM | POA: Diagnosis not present

## 2022-01-02 NOTE — Progress Notes (Signed)
[name] [name], [name]. ([id number]) Visit Report for 01/02/2022 Chief Complaint Document Details Patient Name: Date of Service: [facility], [name] 01/02/2022 8:00 [name] [name] Medical Record Number: [id number] Patient Account Number: [id number] Date of Birth/Sex: Treating RN: 1960-12-05 (61 y.o. [name] [name] Primary Care Provider: PCP, NO Other Clinician: Referring Provider: Treating Provider/Extender: Jari Favre in Treatment: 0 Information Obtained from: Patient Chief Complaint Patient is at the clinic for treatment of multiple open pressure ulcers Electronic Signature(s) Signed: 01/02/2022 9:16:50 AM By: Fredirick Maudlin MD FACS Entered By: Fredirick Maudlin on 01/02/2022 09:16:50 -------------------------------------------------------------------------------- Debridement Details Patient Name: Date of Service: [name] [name], [name] 01/02/2022 8:00 [name] [name] Medical Record Number: [id number] Patient Account Number: [id number] Date of Birth/Sex: Treating RN: [birth year] (61 y.o. [name] [name] Primary Care Provider: PCP, NO Other Clinician: Referring Provider: Treating Provider/Extender: Jari Favre in Treatment: 0 Debridement Performed for Assessment: Wound #2 Left,Lateral Ankle Performed By: Physician Fredirick Maudlin, MD Debridement Type: Debridement Level of Consciousness (Pre-procedure): Awake and Alert Pre-procedure Verification/Time Out Yes - 08:59 Taken: Start Time: 08:59 Pain Control: Lidocaine 5% topical ointment T Area Debrided (L x W): otal 2.7 (cm) x 2.2 (cm) = 5.94 (cm) Tissue and other material debrided: Non-Viable, Slough, Subcutaneous, Slough Level: Skin/Subcutaneous Tissue Debridement Description: Excisional Instrument: Curette Bleeding: Minimum Hemostasis Achieved: Pressure End Time: 09:01 Procedural Pain: 0 Post Procedural Pain: 0 Response to Treatment: Procedure was tolerated well Level of Consciousness (Post-  Awake and Alert procedure): Post Debridement Measurements of Total Wound Length: (cm) 2.7 Stage: Category/Stage III Width: (cm) 2.2 Depth: (cm) 0.2 Volume: (cm) 0.933 Character of Wound/Ulcer Post Debridement: Improved Post Procedure Diagnosis Same as Pre-procedure Electronic Signature(s) Signed: 01/02/2022 11:59:17 AM By: Dellie Catholic RN Signed: 01/02/2022 12:30:27 PM By: Fredirick Maudlin MD FACS Entered By: Dellie Catholic on 01/02/2022 09:05:46 -------------------------------------------------------------------------------- Debridement Details Patient Name: Date of Service: [name] [name], [name] 01/02/2022 8:00 [name] [name] Medical Record Number: [id number] Patient Account Number: [id number] Date of Birth/Sex: Treating RN: December 26, 1960 (61 y.o. [name] [name] Primary Care Provider: PCP, NO Other Clinician: Referring Provider: Treating Provider/Extender: Jari Favre in Treatment: 0 Debridement Performed for Assessment: Wound #3 Left,Medial Ankle Performed By: Physician Fredirick Maudlin, MD Debridement Type: Debridement Level of Consciousness (Pre-procedure): Awake and Alert Pre-procedure Verification/Time Out Yes - 08:59 Taken: Start Time: 08:59 Pain Control: Lidocaine 5% topical ointment T Area Debrided (L x W): otal 0.3 (cm) x 0.3 (cm) = 0.09 (cm) Tissue and other material debrided: Non-Viable, Biofilm Level: Non-Viable Tissue Debridement Description: Selective/Open Wound Instrument: Curette Bleeding: Minimum Hemostasis Achieved: Pressure End Time: 09:01 Procedural Pain: 0 Post Procedural Pain: 0 Response to Treatment: Procedure was tolerated well Level of Consciousness (Post- Awake and Alert procedure): Post Debridement Measurements of Total Wound Length: (cm) 0.3 Stage: Category/Stage II Width: (cm) 0.3 Depth: (cm) 0.1 Volume: (cm) 0.007 Character of Wound/Ulcer Post Debridement: Improved Post Procedure Diagnosis Same as  Pre-procedure Electronic Signature(s) Signed: 01/02/2022 11:59:17 AM By: Dellie Catholic RN Signed: 01/02/2022 12:30:27 PM By: Fredirick Maudlin MD FACS Entered By: Dellie Catholic on 01/02/2022 09:06:44 -------------------------------------------------------------------------------- Debridement Details Patient Name: Date of Service: [name] [name], [name] 01/02/2022 8:00 [name] [name] Medical Record Number: [id number] Patient Account Number: [id number] Date of Birth/Sex: Treating RN: Mar 13, 1961 (61 y.o. [name] [name] Primary Care Provider: PCP, NO Other Clinician: Referring Provider: Treating Provider/Extender: Jari Favre in Treatment: 0 Debridement Performed for Assessment: Wound #1 Sacrum Performed By: Physician Fredirick Maudlin,  MD Debridement Type: Debridement Level of Consciousness (Pre-procedure): Awake and Alert Pre-procedure Verification/Time Out Yes - 08:59 Taken: Start Time: 08:59 Pain Control: Lidocaine 5% topical ointment T Area Debrided (L x W): otal 4.2 (cm) x 2.8 (cm) = 11.76 (cm) Tissue and other material debrided: Non-Viable, Slough, Subcutaneous, Slough Level: Skin/Subcutaneous Tissue Debridement Description: Excisional Instrument: Curette Bleeding: Minimum Hemostasis Achieved: Pressure End Time: 09:01 Procedural Pain: 0 Post Procedural Pain: 0 Response to Treatment: Procedure was tolerated well Level of Consciousness (Post- Awake and Alert procedure): Post Debridement Measurements of Total Wound Length: (cm) 4.2 Stage: Category/Stage IV Width: (cm) 2.8 Depth: (cm) 4 Volume: (cm) 36.945 Character of Wound/Ulcer Post Debridement: Improved Post Procedure Diagnosis Same as Pre-procedure Electronic Signature(s) Signed: 01/02/2022 11:59:17 AM By: Karie Schwalbe RN Signed: 01/02/2022 12:30:27 PM By: Duanne Guess MD FACS Entered By: Karie Schwalbe on 01/02/2022  09:07:26 -------------------------------------------------------------------------------- HPI Details Patient Name: Date of Service: [name] [name], [name] 01/02/2022 8:00 [name] [name] Medical Record Number: [id number] Patient Account Number: [id number] Date of Birth/Sex: Treating RN: Feb 16, 1961 (61 y.o. 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 I am unclear as to how long her sacral pressure ulcer has been present, but there is bone exposed and apparently she was having more drainage from the site. She has also developed [name] stage III pressure ulcer on her left lateral ankle and [name] stage II pressure ulcer on her left medial ankle. She is not diabetic, but does have [name] smoking history and continues to smoke about half [name] pack [name] day. ABIs were performed and were within normal limits. Antibiotics were prescribed for [name] total course of 6 weeks. She has been followed by infectious disease and last saw them on December 20, 2021. Infectious disease referred her to the wound care [name] for evaluation and management. The patient resists frequent turning because of chronic pain issues. The aide from her facility states that she is on [name] low-air-loss mattress. The patient says she has not been permitting dressing changes for the past few days because of her pain. The dressings that were removed from her sacral wound were putrid. She does state that she drinks 2-3 protein shakes such as boost or Ensure daily and she is also receiving Pro-Stat at her facility. Electronic  Signature(s) Signed: 01/02/2022 9:23:42 AM By: Duanne Guess MD FACS Entered By: Duanne Guess on 01/02/2022 09:23:42 -------------------------------------------------------------------------------- Physical Exam Details Patient Name: Date of Service: [name] [name], [name] 01/02/2022 8:00 [name] [name] Medical Record Number: [id number] Patient Account Number: [id number] Date of Birth/Sex: Treating RN: 12-17-1960 (61 y.o. [name] [name] Primary Care Provider: PCP, NO Other Clinician: Referring Provider: Treating Provider/Extender: Cleophus Molt in Treatment: 0 Constitutional . . . . No acute distress. Respiratory Normal work of breathing on room air.. Notes 01/02/2022: On her left medial ankle, there is [name] stage II pressure ulcer with just some skin breakdown; on her left lateral ankle, she has [name] stage III ulcer that looks like it also has some surrounding deep tissue injury. The ulcer has fibrinous exudate that is fairly adherent. The sacral wound has bone frankly exposed.  It has undermining but otherwise appears fairly clean with just [name] bit of slough. Electronic Signature(s) Signed: 01/02/2022 9:25:23 AM By: Fredirick Maudlin MD FACS Entered By: Fredirick Maudlin on 01/02/2022 09:25:22 -------------------------------------------------------------------------------- Physician Orders Details Patient Name: Date of Service: [name] [name], [name] 01/02/2022 8:00 [name] [name] Medical Record Number: [id number] Patient Account Number: [id number] Date of Birth/Sex: Treating RN: June 17, 1961 (61 y.o. 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 - ***** HOYER********Dr. Celine Ahr Room 3 next appointment Monday 01/16/22 at 3:15pm Return [name] Off-Loading [name] fluidized (Group 3) mattress - Level 3 mattress ir Other: - Continue wearing Bunny boots or something similar like prevalon boots and offload pressure points Additional Orders / Instructions Follow Nutritious Diet - Keep doing Protein shakes and Prostat Wound Treatment Wound #1 - Sacrum Cleanser: Soap and Water 1 x Per Day/30 Days Discharge Instructions: May shower and wash wound with dial antibacterial soap and water prior to dressing change. Cleanser: Wound Cleanser 1 x Per Day/30 Days Discharge Instructions: Cleanse the wound with wound cleanser prior to applying [name] clean dressing using gauze sponges, not tissue or cotton balls. Prim Dressing: Dakin's Solution 0.25%, 16 (oz) 1 x Per Day/30 Days ary Discharge Instructions: Moisten gauze with Dakin's solution Secondary Dressing: ABD Pad, 5x9 1 x Per Day/30 Days Discharge Instructions: Apply over primary dressing as directed. Secondary Dressing: Woven Gauze Sponge, Non-Sterile 4x4 in 1 x Per Day/30 Days Discharge Instructions: Apply over primary dressing as directed. Secured With: 21M Medipore Public affairs consultant Surgical T 2x10 (in/yd) 1 x Per Day/30 Days ape Discharge Instructions: Secure with tape as directed. Wound #2 - Ankle Wound Laterality: Left, Lateral Cleanser: Soap and Water 1 x Per Day/30 Days Discharge Instructions: May shower and wash wound with dial antibacterial soap and water prior to dressing change. Cleanser: Wound Cleanser 1 x Per Day/30 Days Discharge Instructions: Cleanse the wound with wound cleanser prior to applying [name] clean dressing using gauze sponges,  not tissue or cotton balls. Prim Dressing: KerraCel Ag Gelling Fiber Dressing, 2x2 in (silver alginate) 1 x Per Day/30 Days ary Discharge Instructions: Apply silver alginate to wound bed as instructed Prim Dressing: Santyl Ointment 1 x Per Day/30 Days ary Discharge Instructions: Apply nickel thick amount to wound bed as instructed Secondary Dressing: Bordered Gauze, 2x2 in 1 x Per Day/30 Days Discharge Instructions: Apply over primary dressing as directed. Secondary Dressing: Woven Gauze Sponges 2x2 in 1 x Per Day/30 Days Discharge Instructions: Apply over primary dressing as directed. Wound #3 - Ankle Wound Laterality: Left, Medial Prim Dressing: KerraCel Ag Gelling Fiber Dressing, 2x2 in (silver alginate) 1 x Per Day/30 Days ary Discharge Instructions: Apply silver alginate to wound bed as instructed Secondary Dressing: Bordered Gauze, 2x2 in 1 x Per Day/30 Days Discharge Instructions: Apply over primary dressing as directed. Electronic Signature(s) Signed: 01/02/2022 12:30:27 PM By: Fredirick Maudlin MD FACS Entered By: Fredirick Maudlin on 01/02/2022 09:25:43 -------------------------------------------------------------------------------- Problem List Details Patient Name: Date of Service: [name] [name], [name] 01/02/2022 8:00 [name] [name] Medical Record Number:  [id number] Patient Account Number: [id number] Date of Birth/Sex: Treating RN: 02-Feb-1961 (61 y.o. [name] [name] Primary Care Provider: PCP, NO Other Clinician: Referring Provider: Treating Provider/Extender: Jari Favre in Treatment: 0 Active Problems ICD-10 Encounter Code Description Active Date MDM Diagnosis L89.154 Pressure ulcer of sacral region, stage 4 01/02/2022 No Yes L89.522 Pressure ulcer of left ankle, stage 2 01/02/2022 No Yes L89.523 Pressure ulcer of left ankle, stage 3 01/02/2022 No Yes M46.28 Osteomyelitis of vertebra, sacral and sacrococcygeal region 01/02/2022 No Yes Z86.73 Personal  history of transient ischemic attack (TIA), and cerebral infarction 01/02/2022 No Yes without residual deficits E44.0 Moderate protein-calorie malnutrition 01/02/2022 No Yes Inactive Problems Resolved Problems Electronic Signature(s) Signed: 01/02/2022 9:13:54 AM By: Fredirick Maudlin MD FACS Previous Signature: 01/02/2022 8:42:19 AM Version By: Fredirick Maudlin MD FACS Entered By: Fredirick Maudlin on 01/02/2022 09:13:53 -------------------------------------------------------------------------------- Progress Note Details Patient Name: Date of Service: [name] [name], [name] 01/02/2022 8:00 [name] [name] Medical Record Number: [id number] Patient Account Number: [id number] Date of Birth/Sex: Treating RN: Mar 30, 1961 (60 y.o. 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 I am unclear as to how long her sacral pressure ulcer has been present, but there is bone exposed and apparently she was having more drainage from the site. She has also developed [name] stage III pressure ulcer on her left lateral ankle and [name] stage II pressure ulcer on her left medial ankle. She is not diabetic, but does have [name] smoking history and continues to smoke about half [name] pack [name] day. ABIs were performed and were within normal limits. Antibiotics were prescribed for [name] total course of 6 weeks. She has been followed by infectious disease and last saw them on December 20, 2021. Infectious  disease referred her to the wound care [name] for evaluation and management. The patient resists frequent turning because of chronic pain issues. The aide from her facility states that she is on [name] low-air-loss mattress. The patient says she has not been permitting dressing changes for the past few days because of her pain. The dressings that were removed from her sacral wound were putrid. She does state that she drinks 2-3 protein shakes such as boost or Ensure daily and she is also receiving Pro-Stat at her facility. Patient History Information obtained from Patient. Allergies Sulfamide (Severity: Severe) Family History Unknown History. Social History Current some day smoker, Marital Status - Single, Alcohol Use - Never, Drug Use - No History, Caffeine Use - Rarely. Medical [name] Surgical History Notes nd Cardiovascular CVA 2001 Psychiatric Bipolar disorder Objective Constitutional No acute distress. Vitals Time Taken: 8:15 AM, Height: 67 in, Source: Stated, Weight: 130 lbs, Source: Stated, BMI: 20.4, Temperature: 98.3 F, Pulse: 84 bpm, Respiratory Rate: 16 breaths/min, Blood Pressure: 102/70 mmHg. Respiratory Normal work of breathing on room air.. General Notes: 01/02/2022: On her left medial ankle, there is [name] stage II pressure ulcer with just some skin breakdown; on her left lateral ankle, she has [name] stage III ulcer that looks like it also has  some surrounding deep tissue injury. The ulcer has fibrinous exudate that is fairly adherent. The sacral wound has bone frankly exposed. It has undermining but otherwise appears fairly clean with just [name] bit of slough. Integumentary (Hair, Skin) Wound #1 status is Open. Original cause of wound was Pressure Injury. The date acquired was: 06/16/2021. The wound is located on the Sacrum. The wound measures 4.2cm length x 2.8cm width x 4cm depth; 9.236cm^2 area and 36.945cm^3 volume. There is bone and Fat Layer (Subcutaneous Tissue) exposed. There is no  tunneling noted, however, there is undermining starting at 6:00 and ending at 5:00 with [name] maximum distance of 1.5cm. There is [name] medium amount of serosanguineous drainage noted. There is small (1-33%) red granulation within the wound bed. There is [name] large (67-100%) amount of necrotic tissue within the wound bed including Adherent Slough. Wound #2 status is Open. Original cause of wound was Pressure Injury. The date acquired was: 06/16/2021. The wound is located on the Left,Lateral Ankle. The wound measures 2.7cm length x 2.2cm width x 0.2cm depth; 4.665cm^2 area and 0.933cm^3 volume. There is Fat Layer (Subcutaneous Tissue) exposed. There is no tunneling or undermining noted. There is [name] medium amount of serosanguineous drainage noted. There is small (1-33%) red granulation within the wound bed. There is [name] large (67-100%) amount of necrotic tissue within the wound bed including Adherent Slough. Wound #3 status is Open. Original cause of wound was Gradually Appeared. The date acquired was: 09/09/2021. The wound is located on the Left,Medial Ankle. The wound measures 0.3cm length x 0.3cm width x 0.1cm depth; 0.071cm^2 area and 0.007cm^3 volume. There is Fat Layer (Subcutaneous Tissue) exposed. There is no tunneling or undermining noted. There is [name] medium amount of serosanguineous drainage noted. The wound margin is distinct with the outline attached to the wound base. There is small (1-33%) red granulation within the wound bed. There is [name] large (67-100%) amount of necrotic tissue within the wound bed including Adherent Slough. Assessment Active Problems ICD-10 Pressure ulcer of sacral region, stage 4 Pressure ulcer of left ankle, stage 2 Pressure ulcer of left ankle, stage 3 Osteomyelitis of vertebra, sacral and sacrococcygeal region Personal history of transient ischemic attack (TIA), and cerebral infarction without residual deficits Moderate protein-calorie malnutrition Procedures Wound  #1 Pre-procedure diagnosis of Wound #1 is [name] Pressure Ulcer located on the Sacrum . There was [name] Excisional Skin/Subcutaneous Tissue Debridement with [name] total area of 11.76 sq cm performed by Fredirick Maudlin, MD. With the following instrument(s): Curette to remove Non-Viable tissue/material. Material removed includes Subcutaneous Tissue and Slough and after achieving pain control using Lidocaine 5% topical ointment. No specimens were taken. [name] time out was conducted at 08:59, prior to the start of the procedure. [name] Minimum amount of bleeding was controlled with Pressure. The procedure was tolerated well with [name] pain level of 0 throughout and [name] pain level of 0 following the procedure. Post Debridement Measurements: 4.2cm length x 2.8cm width x 4cm depth; 36.945cm^3 volume. Post debridement Stage noted as Category/Stage IV. Character of Wound/Ulcer Post Debridement is improved. Post procedure Diagnosis Wound #1: Same as Pre-Procedure Wound #2 Pre-procedure diagnosis of Wound #2 is [name] Pressure Ulcer located on the Left,Lateral Ankle . There was [name] Excisional Skin/Subcutaneous Tissue Debridement with [name] total area of 5.94 sq cm performed by Fredirick Maudlin, MD. With the following instrument(s): Curette to remove Non-Viable tissue/material. Material removed includes Subcutaneous Tissue and Slough and after achieving pain control using Lidocaine 5% topical ointment. No specimens were taken.  [name] time out was conducted at 08:59, prior to the start of the procedure. [name] Minimum amount of bleeding was controlled with Pressure. The procedure was tolerated well with [name] pain level of 0 throughout and [name] pain level of 0 following the procedure. Post Debridement Measurements: 2.7cm length x 2.2cm width x 0.2cm depth; 0.933cm^3 volume. Post debridement Stage noted as Category/Stage III. Character of Wound/Ulcer Post Debridement is improved. Post procedure Diagnosis Wound #2: Same as Pre-Procedure Wound #3 Pre-procedure  diagnosis of Wound #3 is [name] Pressure Ulcer located on the Left,Medial Ankle . There was [name] Selective/Open Wound Non-Viable Tissue Debridement with [name] total area of 0.09 sq cm performed by Fredirick Maudlin, MD. With the following instrument(s): Curette to remove Non-Viable tissue/material. Material removed includes Biofilm after achieving pain control using Lidocaine 5% topical ointment. No specimens were taken. [name] time out was conducted at 08:59, prior to the start of the procedure. [name] Minimum amount of bleeding was controlled with Pressure. The procedure was tolerated well with [name] pain level of 0 throughout and [name] pain level of 0 following the procedure. Post Debridement Measurements: 0.3cm length x 0.3cm width x 0.1cm depth; 0.007cm^3 volume. Post debridement Stage noted as Category/Stage II. Character of Wound/Ulcer Post Debridement is improved. Post procedure Diagnosis Wound #3: Same as Pre-Procedure Plan Follow-up Appointments: Return Appointment in 2 weeks. - ***** HOYER********Dr. Celine Ahr Room 3 next appointment Monday 01/16/22 at 3:15pm Off-Loading: Air fluidized (Group 3) mattress - Level 3 mattress Other: - Continue wearing Bunny boots or something similar like prevalon boots and offload pressure points Additional Orders / Instructions: Follow Nutritious Diet - Keep doing Protein shakes and Prostat WOUND #1: - Sacrum Wound Laterality: Cleanser: Soap and Water 1 x Per Day/30 Days Discharge Instructions: May shower and wash wound with dial antibacterial soap and water prior to dressing change. Cleanser: Wound Cleanser 1 x Per Day/30 Days Discharge Instructions: Cleanse the wound with wound cleanser prior to applying [name] clean dressing using gauze sponges, not tissue or cotton balls. Prim Dressing: Dakin's Solution 0.25%, 16 (oz) 1 x Per Day/30 Days ary Discharge Instructions: Moisten gauze with Dakin's solution Secondary Dressing: ABD Pad, 5x9 1 x Per Day/30 Days Discharge Instructions: Apply  over primary dressing as directed. Secondary Dressing: Woven Gauze Sponge, Non-Sterile 4x4 in 1 x Per Day/30 Days Discharge Instructions: Apply over primary dressing as directed. Secured With: 10M Medipore Public affairs consultant Surgical T 2x10 (in/yd) 1 x Per Day/30 Days ape Discharge Instructions: Secure with tape as directed. WOUND #2: - Ankle Wound Laterality: Left, Lateral Cleanser: Soap and Water 1 x Per Day/30 Days Discharge Instructions: May shower and wash wound with dial antibacterial soap and water prior to dressing change. Cleanser: Wound Cleanser 1 x Per Day/30 Days Discharge Instructions: Cleanse the wound with wound cleanser prior to applying [name] clean dressing using gauze sponges, not tissue or cotton balls. Prim Dressing: KerraCel Ag Gelling Fiber Dressing, 2x2 in (silver alginate) 1 x Per Day/30 Days ary Discharge Instructions: Apply silver alginate to wound bed as instructed Prim Dressing: Santyl Ointment 1 x Per Day/30 Days ary Discharge Instructions: Apply nickel thick amount to wound bed as instructed Secondary Dressing: Bordered Gauze, 2x2 in 1 x Per Day/30 Days Discharge Instructions: Apply over primary dressing as directed. Secondary Dressing: Woven Gauze Sponges 2x2 in 1 x Per Day/30 Days Discharge Instructions: Apply over primary dressing as directed. WOUND #3: - Ankle Wound Laterality: Left, Medial Prim Dressing: KerraCel Ag Gelling Fiber Dressing, 2x2 in (silver alginate) 1 x  Per Day/30 Days ary Discharge Instructions: Apply silver alginate to wound bed as instructed Secondary Dressing: Bordered Gauze, 2x2 in 1 x Per Day/30 Days Discharge Instructions: Apply over primary dressing as directed. 01/02/2022: This is [name] 61 year old bedbound patient with significant contractures. She has sacral osteomyelitis and recently completed [name] 6-week course of IV antibiotics. On her left medial ankle, there is [name] stage II pressure ulcer with just some skin breakdown; on her left lateral ankle,  she has [name] stage III ulcer that looks like it also has some surrounding deep tissue injury. The ulcer has fibrinous exudate that is fairly adherent. The sacral wound has bone frankly exposed. It has undermining but otherwise appears fairly clean with just [name] bit of slough. I used [name] curette to debride slough and nonviable subcutaneous tissue from the sacral wound and the lateral ankle wound as well as some biofilm and light slough from the medial ankle wound. She is clearly failed [name] low-air-loss mattress and I think she needs to move to [name] level 3 air-fluidized surface. She needs to be wearing bilateral Prevalon boots and be turned on [name] frequent basis. Whoever is prescribing her pain medications should make accommodations so that she is more able to cooperate with her care. She should continue her protein intake as she has described it. We will use Santyl on the lateral ankle wound with silver alginate, silver alginate alone to the medial ankle wound, and half percent Dakin's-moistened gauze to the sacral wound. Her dressings need to be changed on [name] daily basis. This was emphasized to her multiple times. We will have her follow-up in 2 weeks due to the challenges of transportation for her. Electronic Signature(s) Signed: 01/02/2022 9:27:55 AM By: Duanne Guess MD FACS Entered By: Duanne Guess on 01/02/2022 09:27:55 -------------------------------------------------------------------------------- HxROS Details Patient Name: Date of Service: [name] [name], [name] 01/02/2022 8:00 [name] [name] Medical Record Number: [id number] Patient Account Number: [id number] Date of Birth/Sex: Treating RN: 1960/10/26 (61 y.o. [name] [name] Primary Care Provider: PCP, NO Other Clinician: Referring Provider: Treating Provider/Extender: Cleophus Molt in Treatment: 0 Information Obtained From Patient Cardiovascular Medical History: Past Medical History Notes: CVA 2001 Psychiatric Medical  History: Past Medical History Notes: Bipolar disorder Immunizations Pneumococcal Vaccine: Received Pneumococcal Vaccination: No Implantable Devices No devices added Family and Social History Unknown History: Yes; Current some day smoker; Marital Status - Single; Alcohol Use: Never; Drug Use: No History; Caffeine Use: Rarely; Financial Concerns: No; Food, Clothing or Shelter Needs: No; Support System Lacking: No; Transportation Concerns: No Electronic Signature(s) Signed: 01/02/2022 11:59:17 AM By: Karie Schwalbe RN Signed: 01/02/2022 12:30:27 PM By: Duanne Guess MD FACS Entered By: Karie Schwalbe on 01/02/2022 08:35:39 -------------------------------------------------------------------------------- SuperBill Details Patient Name: Date of Service: [name] [name], [name] 01/02/2022 Medical Record Number: [id number] Patient Account Number: [id number] Date of Birth/Sex: Treating RN: Aug 20, 1960 (61 y.o. 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), and cerebral infarction without residual deficits E44.0 Moderate protein-calorie malnutrition Facility Procedures CPT4 Code: [id number] Description: 99214 - WOUND CARE VISIT-LEV 4 EST PT Modifier: 25 Quantity: 1 CPT4 Code: [id number] Description: 11042 - DEB SUBQ TISSUE 20 SQ CM/<  ICD-10 Diagnosis Description L89.154 Pressure ulcer of sacral region, stage 4 L89.523 Pressure ulcer of left ankle, stage 3 Modifier: Quantity: 1 CPT4 Code: [id number] Description: [id number] - DEBRIDE WOUND 1ST 20 SQ CM OR < ICD-10  Diagnosis Description L89.522 Pressure ulcer of left ankle, stage 2 Modifier: Quantity: 1 Physician Procedures : CPT4 Code Description Modifier [id number] - WC PHYS LEVEL 4 - NEW PT 25 ICD-10 Diagnosis Description L89.154 Pressure ulcer of sacral region, stage 4 L89.522 Pressure ulcer of left ankle, stage 2 L89.523 Pressure ulcer of left ankle, stage 3 M46.28  Osteomyelitis of vertebra, sacral and sacrococcygeal region Quantity: 1 : [id number] 11042 - WC PHYS SUBQ TISS 20 SQ CM ICD-10 Diagnosis Description L89.154 Pressure ulcer of sacral region, stage 4 L89.523 Pressure ulcer of left ankle, stage 3 Quantity: 1 : [id number] - WC PHYS DEBR WO ANESTH 20 SQ CM ICD-10 Diagnosis Description L89.522 Pressure ulcer of left ankle, stage 2 Quantity: 1 Electronic Signature(s) Signed: 01/02/2022 11:59:17 AM By: Dellie Catholic RN Signed: 01/02/2022 12:30:27 PM By: Fredirick Maudlin MD FACS Previous Signature: 01/02/2022 9:29:52 AM Version By: Fredirick Maudlin MD FACS Entered By: Dellie Catholic on 01/02/2022 09:41:32

## 2022-01-02 NOTE — Progress Notes (Signed)
[name], [name]. ([id number]) Visit Report for 01/02/2022 Abuse Risk Screen Details Patient Name: Date of Service: [name] [name], [name] 01/02/2022 8:00 A M Medical Record Number: [id number] Patient Account Number: [id number] Date of Birth/Sex: Treating RN: 1961-01-24 (61 y.o. [name] [name] Primary Care [name] [name]: PCP, NO Other Clinician: Referring [name] [name]: Treating [name] [name]/Extender: [name] [name] in Treatment: 0 Abuse Risk Screen Items Answer ABUSE RISK SCREEN: Has anyone close to you tried to hurt or harm you recentlyo No Do you feel uncomfortable with anyone in your familyo No Has anyone forced you do things that you didnt want to doo No Electronic Signature(s) Signed: 01/02/2022 11:59:17 AM By: Karie Schwalbe RN Entered By: Karie Schwalbe on 01/02/2022 08:35:46 -------------------------------------------------------------------------------- Activities of Daily Living Details Patient Name: Date of Service: [place] [name], [name] 01/02/2022 8:00 A M Medical Record Number: [id number] Patient Account Number: [id number] Date of Birth/Sex: Treating RN: 08-30-1960 (61 y.o. [name] [name] Primary Care [name] [name]: PCP, NO Other Clinician: Referring [name] [name]: Treating [name] [name]/Extender: [name] [name] in Treatment: 0 Activities of Daily Living Items Answer Activities of Daily Living (Please select one for each item) Drive Automobile Not Able T Medications ake Need Assistance Use T elephone Need Assistance Care for Appearance Not Able Use T oilet Not Able Bath / Shower Not Able Dress Self Not Able Feed Self Need Assistance Walk Not Able Get In / Out Bed Not Able Housework Not Able Prepare Meals Not Able Handle Money Not Able Shop for Self Not Able Electronic Signature(s) Signed: 01/02/2022 11:59:17 AM By: Karie Schwalbe RN Entered By: Karie Schwalbe on 01/02/2022  08:48:27 -------------------------------------------------------------------------------- Education Screening Details Patient Name: Date of Service: [name] [name], [name] 01/02/2022 8:00 A M Medical Record Number: [id number] Patient Account Number: [id number] Date of Birth/Sex: Treating RN: [birth year] (61 y.o. [name] [name] Primary Care [name] [name]: PCP, NO Other Clinician: Referring [name] [name]: Treating [name] [name]/Extender: [name] [name] in Treatment: 0 Primary Learner Assessed: Patient Learning Preferences/Education Level/Primary Language Learning Preference: Explanation Highest Education Level: High School Preferred Language: English Cognitive Barrier Language Barrier: No Translator Needed: No Memory Deficit: No Emotional Barrier: No Cultural/Religious Beliefs Affecting Medical Care: No Physical Barrier Impaired Vision: No Impaired Hearing: No Decreased Hand dexterity: Yes Limitations: Contracted right arm Knowledge/Comprehension Knowledge Level: Medium Comprehension Level: Medium Ability to understand written instructions: Medium Ability to understand verbal instructions: Medium Motivation Anxiety Level: Anxious Cooperation: Cooperative Education Importance: Acknowledges Need Interest in Health Problems: Asks Questions Perception: Coherent Willingness to Engage in Self-Management Low Activities: Readiness to Engage in Self-Management Low Activities: Electronic Signature(s) Signed: 01/02/2022 11:59:17 AM By: Karie Schwalbe RN Entered By: Karie Schwalbe on 01/02/2022 08:49:33 -------------------------------------------------------------------------------- Fall Risk Assessment Details Patient Name: Date of Service: [name] [name], [name] 01/02/2022 8:00 A M Medical Record Number: [id number] Patient Account Number: [id number] Date of Birth/Sex: Treating RN: February 10, 1961 (61 y.o. [name] [name] Primary Care [name] [name]: PCP, NO Other  Clinician: Referring [name] [name]: Treating [name] [name]/Extender: [name] [name] in Treatment: 0 Fall Risk Assessment Items Have you had 2 or more falls in the last 12 monthso 0 No Have you had any fall that resulted in injury in the last 12 monthso 0 Yes FALLS RISK SCREEN History of falling - immediate or within 3 months 0 No Secondary diagnosis (Do you have 2 or more medical diagnoseso) 15 Yes Ambulatory aid None/bed rest/wheelchair/nurse 0 Yes Crutches/cane/walker 0 No Furniture 0 No Intravenous therapy Access/Saline/Heparin Lock 0 No Gait/Transferring Normal/ bed rest/ wheelchair  0 Yes Weak (short steps with or without shuffle, stooped but able to lift head while walking, may seek 0 No support from furniture) Impaired (short steps with shuffle, may have difficulty arising from chair, head down, impaired 0 No balance) Mental Status Oriented to own ability 0 Yes Electronic Signature(s) Signed: 01/02/2022 11:59:17 AM By: Karie Schwalbe RN Entered By: Karie Schwalbe on 01/02/2022 08:51:07 -------------------------------------------------------------------------------- Foot Assessment Details Patient Name: Date of Service: [name] [name], [name] 01/02/2022 8:00 A M Medical Record Number: [id number] Patient Account Number: [id number] Date of Birth/Sex: Treating RN: 04-09-1961 (61 y.o. [name] [name] Primary Care [name] [name]: PCP, NO Other Clinician: Referring [name] [name]: Treating [name] [name]/Extender: [name] [name] in Treatment: 0 Foot Assessment Items Site Locations + = Sensation present, - = Sensation absent, C = Callus, U = Ulcer R = Redness, W = Warmth, M = Maceration, PU = Pre-ulcerative lesion F = Fissure, S = Swelling, D = Dryness Assessment Right: Left: Other Deformity: No No Prior Foot Ulcer: No No Prior Amputation: No No Charcot Joint: No No Ambulatory Status: Non-ambulatory Assistance Device: Wheelchair Gait:  Surveyor, mining) Signed: 01/02/2022 11:59:17 AM By: Karie Schwalbe RN Entered By: Karie Schwalbe on 01/02/2022 08:54:42 -------------------------------------------------------------------------------- Nutrition Risk Screening Details Patient Name: Date of Service: [name] [name], [name] 01/02/2022 8:00 A M Medical Record Number: [id number] Patient Account Number: [id number] Date of Birth/Sex: Treating RN: 1960-09-08 (61 y.o. [name] [name] Primary Care Hubert Derstine: PCP, NO Other Clinician: Referring Arika Mainer: Treating Josep Luviano/Extender: [name] [name] in Treatment: 0 Height (in): 67 Weight (lbs): 130 Body Mass Index (BMI): 20.4 Nutrition Risk Screening Items Score Screening NUTRITION RISK SCREEN: I have an illness or condition that made me change the kind and/or amount of food I eat 0 No I eat fewer than two meals per day 0 No I eat few fruits and vegetables, or milk products 0 No I have three or more drinks of beer, liquor or wine almost every day 0 No I have tooth or mouth problems that make it hard for me to eat 0 No I don't always have enough money to buy the food I need 0 No I eat alone most of the time 0 No I take three or more different prescribed or over-the-counter drugs a day 1 Yes Without wanting to, I have lost or gained 10 pounds in the last six months 2 Yes I am not always physically able to shop, cook and/or feed myself 0 No Nutrition Protocols Good Risk Protocol Moderate Risk Protocol 0 Provide education on nutrition High Risk Proctocol Risk Level: Moderate Risk Score: 3 Electronic Signature(s) Signed: 01/02/2022 11:59:17 AM By: Karie Schwalbe RN Entered By: Karie Schwalbe on 01/02/2022 08:51:44

## 2022-01-06 NOTE — Progress Notes (Signed)
[name], [name]. ([id number]) Visit Report for 01/02/2022 Allergy List Details Patient Name: Date of Service: [facility], [name] 01/02/2022 8:00 A M Medical Record Number: [id number] Patient Account Number: [id number] Date of Birth/Sex: Treating RN: Nov 18, 1960 (61 y.o. [name] [name] Primary Care Lashaunda Schild: PCP, NO Other Clinician: Referring Hiawatha Dressel: Treating Athel Merriweather/Extender: Jari Favre in Treatment: 0 Allergies Active Allergies Sulfamide Severity: Severe Allergy Notes Electronic Signature(s) Signed: 01/02/2022 11:59:17 AM By: Dellie Catholic RN Entered By: Dellie Catholic on 01/02/2022 08:18:34 -------------------------------------------------------------------------------- Arrival Information Details Patient Name: Date of Service: [name] [name], [name] 01/02/2022 8:00 A M Medical Record Number: [id number] Patient Account Number: [id number] Date of Birth/Sex: Treating RN: 07-Jul-1960 (61 y.o. [name] [name] Primary Care Lecretia Buczek: PCP, NO Other Clinician: Referring Vernadine Coombs: Treating Keilana Morlock/Extender: Jari Favre in Treatment: 0 Visit Information Patient Arrived: Wheel Chair Arrival Time: 08:05 Accompanied By: caregiver Transfer Assistance: Harrel Lemon Lift Patient Identification Verified: Yes Patient Has Alerts: Yes Patient Alerts: ABI R 0.86 ABI L 1.04 TBI R 0.71 TBI L 0.78 Notes *** HOYER***** Extra Time Electronic Signature(s) Signed: 01/02/2022 11:59:17 AM By: Dellie Catholic RN Entered By: Dellie Catholic on 01/02/2022 09:20:33 -------------------------------------------------------------------------------- Clinic Level of Care Assessment Details Patient Name: Date of Service: [facility], [name] 01/02/2022 8:00 A M Medical Record Number: [id number] Patient Account Number: [id number] Date of Birth/Sex: Treating RN: 11/02/1960 (61 y.o. [name] [name] Primary Care Navya Timmons: PCP, NO Other Clinician: Referring  Orestes Geiman: Treating Joeline Freer/Extender: Jari Favre in Treatment: 0 Clinic Level of Care Assessment Items TOOL 1 Quantity Score X- 1 0 Use when EandM and Procedure is performed on INITIAL visit ASSESSMENTS - Nursing Assessment / Reassessment X- 1 20 General Physical Exam (combine w/ comprehensive assessment (listed just below) when performed on new pt. evals) X- 1 25 Comprehensive Assessment (HX, ROS, Risk Assessments, Wounds Hx, etc.) ASSESSMENTS - Wound and Skin Assessment / Reassessment X- 1 10 Dermatologic / Skin Assessment (not related to wound area) ASSESSMENTS - Ostomy and/or Continence Assessment and Care []  - 0 Incontinence Assessment and Management []  - 0 Ostomy Care Assessment and Management (repouching, etc.) PROCESS - Coordination of Care []  - 0 Simple Patient / Family Education for ongoing care X- 1 20 Complex (extensive) Patient / Family Education for ongoing care X- 1 10 Staff obtains Programmer, systems, Records, T Results / Process Orders est X- 1 10 Staff telephones HHA, Nursing Homes / Clarify orders / etc []  - 0 Routine Transfer to another Facility (non-emergent condition) []  - 0 Routine Hospital Admission (non-emergent condition) X- 1 15 New Admissions / Biomedical engineer / Ordering NPWT Apligraf, etc. , []  - 0 Emergency Hospital Admission (emergent condition) PROCESS - Special Needs []  - 0 Pediatric / Minor Patient Management []  - 0 Isolation Patient Management []  - 0 Hearing / Language / Visual special needs []  - 0 Assessment of Community assistance (transportation, D/C planning, etc.) X- 1 15 Additional assistance / Altered mentation []  - 0 Support Surface(s) Assessment (bed, cushion, seat, etc.) INTERVENTIONS - Miscellaneous []  - 0 External ear exam []  - 0 Patient Transfer (multiple staff / Civil Service fast streamer / Similar devices) []  - 0 Simple Staple / Suture removal (25 or less) []  - 0 Complex Staple / Suture removal  (26 or more) []  - 0 Hypo/Hyperglycemic Management (do not check if billed separately) []  - 0 Ankle / Brachial Index (ABI) - do not check if billed separately Has the patient been seen at the hospital within  the last three years: Yes Total Score: 125 Level Of Care: New/Established - Level 4 Electronic Signature(s) Signed: 01/02/2022 11:59:17 AM By: Karie Schwalbe RN Signed: 01/02/2022 11:59:17 AM By: Karie Schwalbe RN Entered By: Karie Schwalbe on 01/02/2022 09:41:09 -------------------------------------------------------------------------------- Encounter Discharge Information Details Patient Name: Date of Service: [facility], [name] 01/02/2022 8:00 A M Medical Record Number: [id number] Patient Account Number: [id number] Date of Birth/Sex: Treating RN: 1960/09/02 (61 y.o. [name] [name] Primary Care Charlesa Ehle: PCP, NO Other Clinician: Referring Johndaniel Catlin: Treating Dacota Devall/Extender: Cleophus Molt in Treatment: 0 Encounter Discharge Information Items Post Procedure Vitals Discharge Condition: Stable Temperature (F): 98.3 Ambulatory Status: Wheelchair Pulse (bpm): 84 Discharge Destination: Skilled Nursing Facility Respiratory Rate (breaths/min): 16 Telephoned: No Blood Pressure (mmHg): 102/70 Orders Sent: Yes Transportation: Other Accompanied By: caregiver Schedule Follow-up Appointment: Yes Clinical Summary of Care: Patient Declined Notes **** HOYER***** Electronic Signature(s) Signed: 01/02/2022 11:59:17 AM By: Karie Schwalbe RN Entered By: Karie Schwalbe on 01/02/2022 09:43:09 -------------------------------------------------------------------------------- Lower Extremity Assessment Details Patient Name: Date of Service: [facility], [name] 01/02/2022 8:00 A M Medical Record Number: [id number] Patient Account Number: [id number] Date of Birth/Sex: Treating RN: 1960-11-13 (61 y.o. [name] [name] Primary Care Rania Prothero: PCP, NO Other  Clinician: Referring Harlene Petralia: Treating Marcelis Wissner/Extender: Cleophus Molt in Treatment: 0 Edema Assessment Assessed: Kyra Searles: No] [Right: No] E[Left: dema] [Right: :] Calf Left: Right: Point of Measurement: 34 cm From Medial Instep 31 cm Ankle Left: Right: Point of Measurement: 12 cm From Medial Instep 22.7 cm Knee To Floor Left: Right: From Medial Instep 41 cm Electronic Signature(s) Signed: 01/02/2022 11:59:17 AM By: Karie Schwalbe RN Entered By: Karie Schwalbe on 01/02/2022 08:45:16 -------------------------------------------------------------------------------- Multi Wound Chart Details Patient Name: Date of Service: [name] [name], [name] 01/02/2022 8:00 A M Medical Record Number: [id number] Patient Account Number: [id number] Date of Birth/Sex: Treating RN: 11-12-1960 (61 y.o. [name] [name] Primary Care Lessly Stigler: PCP, NO Other Clinician: Referring Kalecia Hartney: Treating Aidon Klemens/Extender: Cleophus Molt in Treatment: 0 Vital Signs Height(in): 67 Pulse(bpm): 84 Weight(lbs): 130 Blood Pressure(mmHg): 102/70 Body Mass Index(BMI): 20.4 Temperature(F): 98.3 Respiratory Rate(breaths/min): 16 Photos: Sacrum Left, Lateral Ankle Left, Medial Ankle Wound Location: Pressure Injury Pressure Injury Gradually Appeared Wounding Event: Pressure Ulcer Pressure Ulcer Pressure Ulcer Primary Etiology: 06/16/2021 06/16/2021 09/09/2021 Date Acquired: 0 0 0 Weeks of Treatment: Open Open Open Wound Status: No No No Wound Recurrence: 4.2x2.8x4 2.7x2.2x0.2 0.3x0.3x0.1 Measurements L x W x D (cm) 9.236 4.665 0.071 A (cm) : rea 36.945 0.933 0.007 Volume (cm) : 0.00% 0.00% N/A % Reduction in A rea: 0.00% 0.00% N/A % Reduction in Volume: 6 Starting Position 1 (o'clock): 5 Ending Position 1 (o'clock): 1.5 Maximum Distance 1 (cm): Yes No No Undermining: Category/Stage IV Category/Stage III Category/Stage  II Classification: Medium Medium Medium Exudate A mount: Serosanguineous Serosanguineous Serosanguineous Exudate Type: red, [name] red, [name] red, [name] Exudate Color: N/A N/A Distinct, outline attached Wound Margin: Small (1-33%) Small (1-33%) Small (1-33%) Granulation A mount: Red Red Red Granulation Quality: Large (67-100%) Large (67-100%) Large (67-100%) Necrotic A mount: Fat Layer (Subcutaneous Tissue): Yes Fat Layer (Subcutaneous Tissue): Yes Fat Layer (Subcutaneous Tissue): Yes Exposed Structures: Bone: Yes Fascia: No Fascia: No Fascia: No Tendon: No Tendon: No Tendon: No Muscle: No Muscle: No Muscle: No Joint: No Joint: No Joint: No Bone: No Bone: No N/A None None Epithelialization: Debridement - Excisional Debridement - Excisional Debridement - Selective/Open Wound Debridement: Pre-procedure Verification/Time Out 08:59 08:59 08:59 Taken: Lidocaine 5%  topical ointment Lidocaine 5% topical ointment Lidocaine 5% topical ointment Pain Control: Subcutaneous, Slough Subcutaneous, Slough N/A Tissue Debrided: Skin/Subcutaneous Tissue Skin/Subcutaneous Tissue Non-Viable Tissue Level: 11.76 5.94 0.09 Debridement A (sq cm): rea Curette Curette Curette Instrument: Minimum Minimum Minimum Bleeding: Pressure Pressure Pressure Hemostasis A chieved: 0 0 0 Procedural Pain: 0 0 0 Post Procedural Pain: Procedure was tolerated well Procedure was tolerated well Procedure was tolerated well Debridement Treatment Response: 4.2x2.8x4 2.7x2.2x0.2 0.3x0.3x0.1 Post Debridement Measurements L x W x D (cm) 36.945 0.933 0.007 Post Debridement Volume: (cm) Category/Stage IV Category/Stage III Category/Stage II Post Debridement Stage: Debridement Debridement Debridement Procedures Performed: Treatment Notes Electronic Signature(s) Signed: 01/02/2022 9:16:27 AM By: Duanne Guess MD FACS Signed: 01/02/2022 11:59:17 AM By: Karie Schwalbe RN Entered By: Duanne Guess on 01/02/2022 09:16:27 -------------------------------------------------------------------------------- Multi-Disciplinary Care Plan Details Patient Name: Date of Service: [facility], [name] 01/02/2022 8:00 A M Medical Record Number: [id number] Patient Account Number: [id number] Date of Birth/Sex: Treating RN: 01-24-1961 (61 y.o. [name] [name] Primary Care Sanoe Hazan: PCP, NO Other Clinician: Referring Haskel Dewalt: Treating Teeghan Hammer/Extender: Cleophus Molt in Treatment: 0 Active Inactive Wound/Skin Impairment Nursing Diagnoses: Knowledge deficit related to ulceration/compromised skin integrity Goals: Patient/caregiver will verbalize understanding of skin care regimen Date Initiated: 01/02/2022 Target Resolution Date: 02/23/2022 Goal Status: Active Interventions: Assess ulceration(s) every visit Treatment Activities: Skin care regimen initiated : 01/02/2022 Notes: Electronic Signature(s) Signed: 01/02/2022 11:59:17 AM By: Karie Schwalbe RN Entered By: Karie Schwalbe on 01/02/2022 09:39:31 -------------------------------------------------------------------------------- Pain Assessment Details Patient Name: Date of Service: [name] [name], [name] 01/02/2022 8:00 A M Medical Record Number: [id number] Patient Account Number: [id number] Date of Birth/Sex: Treating RN: 1960/10/12 (61 y.o. [name] [name] Primary Care Danice Dippolito: PCP, NO Other Clinician: Referring Kathelyn Gombos: Treating Rosalinda Seaman/Extender: Cleophus Molt in Treatment: 0 Active Problems Location of Pain Severity and Description of Pain Patient Has Paino Yes Site Locations Pain Location: Generalized Pain With Dressing Change: No Duration of the Pain. Constant / Intermittento Constant Rate the pain. Current Pain Level: 7 Worst Pain Level: 10 Least Pain Level: 7 Tolerable Pain Level: 7 Character of Pain Describe the Pain: Difficult to Pinpoint Pain  Management and Medication Current Pain Management: Medication: Yes Cold Application: No Rest: Yes Massage: No Activity: No T.E.N.S.: No Heat Application: No Leg drop or elevation: No Is the Current Pain Management Adequate: Adequate How does your wound impact your activities of daily livingo Sleep: No Bathing: No Appetite: No Relationship With Others: No Bladder Continence: No Emotions: No Bowel Continence: No Work: No Toileting: No Drive: No Dressing: No Hobbies: No Electronic Signature(s) Signed: 01/02/2022 11:59:17 AM By: Karie Schwalbe RN Entered By: Karie Schwalbe on 01/02/2022 08:55:24 -------------------------------------------------------------------------------- Patient/Caregiver Education Details Patient Name: Date of Service: [name] [name], [name] 7/10/2023andnbsp8:00 A M Medical Record Number: [id number] Patient Account Number: [id number] Date of Birth/Gender: Treating RN: Aug 29, 1960 (61 y.o. [name] [name] Primary Care Physician: PCP, NO Other Clinician: Referring Physician: Treating Physician/Extender: Cleophus Molt in Treatment: 0 Education Assessment Education Provided To: Patient and Caregiver Education Topics Provided Wound/Skin Impairment: Methods: Explain/Verbal Responses: Return demonstration correctly Electronic Signature(s) Signed: 01/02/2022 11:59:17 AM By: Karie Schwalbe RN Entered By: Karie Schwalbe on 01/02/2022 09:39:46 -------------------------------------------------------------------------------- Wound Assessment Details Patient Name: Date of Service: [name] [name], [name] 01/02/2022 8:00 A M Medical Record Number: [id number] Patient Account Number: [id number] Date of Birth/Sex: Treating RN: Feb 07, 1961 (61 y.o. [name] [name] Primary Care Prakriti Carignan: PCP, NO Other Clinician: Referring Mayson Mcneish:  Treating Yenny Kosa/Extender: Jari Favre in Treatment: 0 Wound Status Wound  Number: 1 Primary Etiology: Pressure Ulcer Wound Location: Sacrum Wound Status: Open Wounding Event: Pressure Injury Date Acquired: 06/16/2021 Weeks Of Treatment: 0 Clustered Wound: No Photos Wound Measurements Length: (cm) 4.2 Width: (cm) 2.8 Depth: (cm) 4 Area: (cm) 9.236 Volume: (cm) 36.945 % Reduction in Area: 0% % Reduction in Volume: 0% Tunneling: No Undermining: Yes Starting Position (o'clock): 6 Ending Position (o'clock): 5 Maximum Distance: (cm) 1.5 Wound Description Classification: Category/Stage IV Exudate Amount: Medium Exudate Type: Serosanguineous Exudate Color: red, [name] Foul Odor After Cleansing: No Slough/Fibrino Yes Wound Bed Granulation Amount: Small (1-33%) Exposed Structure Granulation Quality: Red Fascia Exposed: No Necrotic Amount: Large (67-100%) Fat Layer (Subcutaneous Tissue) Exposed: Yes Necrotic Quality: Adherent Slough Tendon Exposed: No Muscle Exposed: No Joint Exposed: No Bone Exposed: Yes Treatment Notes Wound #1 (Sacrum) Cleanser Soap and Water Discharge Instruction: May shower and wash wound with dial antibacterial soap and water prior to dressing change. Wound Cleanser Discharge Instruction: Cleanse the wound with wound cleanser prior to applying a clean dressing using gauze sponges, not tissue or cotton balls. Peri-Wound Care Topical Primary Dressing Dakin's Solution 0.25%, 16 (oz) Discharge Instruction: Moisten gauze with Dakin's solution Secondary Dressing ABD Pad, 5x9 Discharge Instruction: Apply over primary dressing as directed. Woven Gauze Sponge, Non-Sterile 4x4 in Discharge Instruction: Apply over primary dressing as directed. Secured With [employer] Surgical T 2x10 (in/yd) ape Discharge Instruction: Secure with tape as directed. Compression Wrap Compression Stockings Add-Ons Electronic Signature(s) Signed: 01/02/2022 11:59:17 AM By: Dellie Catholic RN Signed: 01/06/2022 12:33:08 PM By: Sharyn Creamer RN, BSN Entered By: Sharyn Creamer on 01/02/2022 08:42:38 -------------------------------------------------------------------------------- Wound Assessment Details Patient Name: Date of Service: [name] [name], [name] 01/02/2022 8:00 A M Medical Record Number: [id number] Patient Account Number: [id number] Date of Birth/Sex: Treating RN: May 17, 1961 (61 y.o. [name] [name] Primary Care Afifa Truax: PCP, NO Other Clinician: Referring Antonela Freiman: Treating Selby Slovacek/Extender: Jari Favre in Treatment: 0 Wound Status Wound Number: 2 Primary Etiology: Pressure Ulcer Wound Location: Left, Lateral Ankle Wound Status: Open Wounding Event: Pressure Injury Date Acquired: 06/16/2021 Weeks Of Treatment: 0 Clustered Wound: No Photos Wound Measurements Length: (cm) 2.7 Width: (cm) 2.2 Depth: (cm) 0.2 Area: (cm) 4.665 Volume: (cm) 0.933 % Reduction in Area: 0% % Reduction in Volume: 0% Epithelialization: None Tunneling: No Undermining: No Wound Description Classification: Category/Stage III Exudate Amount: Medium Exudate Type: Serosanguineous Exudate Color: red, [name] Foul Odor After Cleansing: No Slough/Fibrino Yes Wound Bed Granulation Amount: Small (1-33%) Exposed Structure Granulation Quality: Red Fascia Exposed: No Necrotic Amount: Large (67-100%) Fat Layer (Subcutaneous Tissue) Exposed: Yes Necrotic Quality: Adherent Slough Tendon Exposed: No Muscle Exposed: No Joint Exposed: No Bone Exposed: No Treatment Notes Wound #2 (Ankle) Wound Laterality: Left, Lateral Cleanser Soap and Water Discharge Instruction: May shower and wash wound with dial antibacterial soap and water prior to dressing change. Wound Cleanser Discharge Instruction: Cleanse the wound with wound cleanser prior to applying a clean dressing using gauze sponges, not tissue or cotton balls. Peri-Wound Care Topical Primary Dressing KerraCel Ag Gelling Fiber Dressing, 2x2 in  (silver alginate) Discharge Instruction: Apply silver alginate to wound bed as instructed Santyl Ointment Discharge Instruction: Apply nickel thick amount to wound bed as instructed Secondary Dressing Bordered Gauze, 2x2 in Discharge Instruction: Apply over primary dressing as directed. Woven Gauze Sponges 2x2 in Discharge Instruction: Apply over primary dressing as directed. Secured With Compression Wrap Compression Stockings Add-Ons Electronic  Signature(s) Signed: 01/02/2022 11:59:17 AM By: Karie Schwalbe RN Signed: 01/06/2022 12:33:08 PM By: Redmond Pulling RN, BSN Entered By: Redmond Pulling on 01/02/2022 08:43:47 -------------------------------------------------------------------------------- Wound Assessment Details Patient Name: Date of Service: [name] [name], [name] 01/02/2022 8:00 A M Medical Record Number: [id number] Patient Account Number: [id number] Date of Birth/Sex: Treating RN: June 15, 1961 (62 y.o. [name] [name] Primary Care Usbaldo Pannone: PCP, NO Other Clinician: Referring Desarie Feild: Treating Susana Duell/Extender: Cleophus Molt in Treatment: 0 Wound Status Wound Number: 3 Primary Etiology: Pressure Ulcer Wound Location: Left, Medial Ankle Wound Status: Open Wounding Event: Gradually Appeared Date Acquired: 09/09/2021 Weeks Of Treatment: 0 Clustered Wound: No Photos Wound Measurements Length: (cm) 0.3 Width: (cm) 0.3 Depth: (cm) 0.1 Area: (cm) 0.071 Volume: (cm) 0.007 % Reduction in Area: % Reduction in Volume: Epithelialization: None Tunneling: No Undermining: No Wound Description Classification: Category/Stage II Wound Margin: Distinct, outline attached Exudate Amount: Medium Exudate Type: Serosanguineous Exudate Color: red, [name] Foul Odor After Cleansing: No Slough/Fibrino Yes Wound Bed Granulation Amount: Small (1-33%) Exposed Structure Granulation Quality: Red Fascia Exposed: No Necrotic Amount: Large (67-100%) Fat  Layer (Subcutaneous Tissue) Exposed: Yes Necrotic Quality: Adherent Slough Tendon Exposed: No Muscle Exposed: No Joint Exposed: No Bone Exposed: No Treatment Notes Wound #3 (Ankle) Wound Laterality: Left, Medial Cleanser Peri-Wound Care Topical Primary Dressing KerraCel Ag Gelling Fiber Dressing, 2x2 in (silver alginate) Discharge Instruction: Apply silver alginate to wound bed as instructed Secondary Dressing Bordered Gauze, 2x2 in Discharge Instruction: Apply over primary dressing as directed. Secured With Compression Wrap Compression Stockings Facilities manager) Signed: 01/06/2022 12:33:08 PM By: Redmond Pulling RN, BSN Entered By: Redmond Pulling on 01/02/2022 08:51:10 -------------------------------------------------------------------------------- Vitals Details Patient Name: Date of Service: [name] [name], [name] 01/02/2022 8:00 A M Medical Record Number: [id number] Patient Account Number: [id number] Date of Birth/Sex: Treating RN: [birth year] (61 y.o. [name] [name] Primary Care Cing : PCP, NO Other Clinician: Referring Ivelise Castillo: Treating Beuford Garcilazo/Extender: Cleophus Molt in Treatment: 0 Vital Signs Time Taken: 08:15 Temperature (F): 98.3 Height (in): 67 Pulse (bpm): 84 Source: Stated Respiratory Rate (breaths/min): 16 Weight (lbs): 130 Blood Pressure (mmHg): 102/70 Source: Stated Reference Range: 80 - 120 mg / dl Body Mass Index (BMI): 20.4 Electronic Signature(s) Signed: 01/02/2022 11:59:17 AM By: Karie Schwalbe RN Entered By: Karie Schwalbe on 01/02/2022 08:17:11

## 2022-01-16 ENCOUNTER — Encounter (HOSPITAL_BASED_OUTPATIENT_CLINIC_OR_DEPARTMENT_OTHER): Payer: Medicaid Other | Admitting: General Surgery

## 2022-01-16 DIAGNOSIS — L89154 Pressure ulcer of sacral region, stage 4: Secondary | ICD-10-CM | POA: Diagnosis not present

## 2022-01-16 NOTE — Progress Notes (Signed)
Charley, Tonnia L. ([id number]) Visit Report for 01/16/2022 Arrival Information Details Patient Name: Date of Service: [facility], [name] 01/16/2022 3:15 PM Medical Record Number: [id number] Patient Account Number: [id number] Date of Birth/Sex: Treating RN: 01-Jan-1961 (61 y.o. [name] Primary Care Jovane Foutz: PCP, NO Other Clinician: Referring Shantana Christon: Treating Boluwatife Mutchler/Extender: Jari Favre in Treatment: 2 Visit Information History Since Last Visit Added or deleted any medications: No Patient Arrived: Wheel Chair Any new allergies or adverse reactions: No Arrival Time: 15:39 Had a fall or experienced change in No Accompanied By: caregiver activities of daily living that may affect Transfer Assistance: Harrel Lemon Lift risk of falls: Patient Identification Verified: Yes Signs or symptoms of abuse/neglect since last visito No Patient Has Alerts: Yes Hospitalized since last visit: No Patient Alerts: ABI R 0.86 Implantable device outside of the clinic excluding No ABI L 1.04 cellular tissue based products placed in the center TBI R 0.71 since last visit: TBI L 0.78 Has Dressing in Place as Prescribed: Yes Pain Present Now: Yes Electronic Signature(s) Signed: 01/16/2022 5:34:25 PM By: Dellie Catholic RN Entered By: Dellie Catholic on 01/16/2022 15:40:16 -------------------------------------------------------------------------------- Encounter Discharge Information Details Patient Name: Date of Service: [facility], [name] 01/16/2022 3:15 PM Medical Record Number: [id number] Patient Account Number: [id number] Date of Birth/Sex: Treating RN: 12/11/1960 (61 y.o. [name] Primary Care Shenandoah Vandergriff: PCP, NO Other Clinician: Referring Porter Nakama: Treating Terrianne Cavness/Extender: Jari Favre in Treatment: 2 Encounter Discharge Information Items Post Procedure Vitals Discharge Condition: Stable Temperature (F): 98.6 Ambulatory  Status: Wheelchair Pulse (bpm): 64 Discharge Destination: [place] Respiratory Rate (breaths/min): 18 Telephoned: No Blood Pressure (mmHg): 101/71 Orders Sent: Yes Transportation: Other Accompanied By: Friend/caregiver Schedule Follow-up Appointment: Yes Clinical Summary of Care: Patient Declined Electronic Signature(s) Signed: 01/16/2022 5:34:25 PM By: Dellie Catholic RN Entered By: Dellie Catholic on 01/16/2022 17:25:13 -------------------------------------------------------------------------------- Lower Extremity Assessment Details Patient Name: Date of Service: [facility], [name] 01/16/2022 3:15 PM Medical Record Number: [id number] Patient Account Number: [id number] Date of Birth/Sex: Treating RN: 10-10-1960 (61 y.o. [name] Primary Care Jahnia Hewes: PCP, NO Other Clinician: Referring Oswin Johal: Treating Maanav Kassabian/Extender: Jari Favre in Treatment: 2 Edema Assessment Assessed: [Left: No] [Right: No] E[Left: dema] [Right: :] Calf Left: Right: Point of Measurement: 34 cm From Medial Instep 31 cm Ankle Left: Right: Point of Measurement: 12 cm From Medial Instep 22.7 cm Electronic Signature(s) Signed: 01/16/2022 5:34:25 PM By: Dellie Catholic RN Entered By: Dellie Catholic on 01/16/2022 15:42:10 -------------------------------------------------------------------------------- Multi Wound Chart Details Patient Name: Date of Service: [facility], [name] 01/16/2022 3:15 PM Medical Record Number: [id number] Patient Account Number: [id number] Date of Birth/Sex: Treating RN: Apr 29, 1961 (61 y.o. [name] Primary Care Lillah Standre: PCP, NO Other Clinician: Referring Hayzel Ruberg: Treating Dametri Ozburn/Extender: Jari Favre in Treatment: 2 Vital Signs Height(in): 67 Pulse(bpm): 64 Weight(lbs): 130 Blood Pressure(mmHg): 101/71 Body Mass Index(BMI): 20.4 Temperature(F): 98.6 Respiratory  Rate(breaths/min): 18 Photos: Sacrum Left, Lateral Ankle Left, Medial Ankle Wound Location: Pressure Injury Pressure Injury Gradually Appeared Wounding Event: Pressure Ulcer Pressure Ulcer Pressure Ulcer Primary Etiology: 06/16/2021 06/16/2021 09/09/2021 Date Acquired: 2 2 2  Weeks of Treatment: Open Open Open Wound Status: No No No Wound Recurrence: 4.2x2.5x3.6 3x2.3x0.2 0.2x0.2x0.1 Measurements L x W x D (cm) 8.247 5.419 0.031 A (cm) : rea 29.688 1.084 0.003 Volume (cm) : 10.70% -16.20% 56.30% % Reduction in A rea: 19.60% -16.20% 57.10% % Reduction in Volume: 6 Starting Position 1 (o'clock): 5  Ending Position 1 (o'clock): 1.5 Maximum Distance 1 (cm): Yes No No Undermining: Category/Stage IV Category/Stage III Category/Stage II Classification: Medium Medium Medium Exudate A mount: Serosanguineous Serosanguineous Serosanguineous Exudate Type: red, brown red, brown red, brown Exudate Color: N/A N/A Distinct, outline attached Wound Margin: Large (67-100%) Large (67-100%) Small (1-33%) Granulation A mount: Red, Hyper-granulation, Friable Red, Hyper-granulation, Friable Red, Pink Granulation Quality: Small (1-33%) Small (1-33%) Large (67-100%) Necrotic A mount: Adherent Slough Adherent Slough Eschar, Adherent Slough Necrotic Tissue: Fat Layer (Subcutaneous Tissue): Yes Fat Layer (Subcutaneous Tissue): Yes Fat Layer (Subcutaneous Tissue): Yes Exposed Structures: Bone: Yes Fascia: No Fascia: No Fascia: No Tendon: No Tendon: No Tendon: No Muscle: No Muscle: No Muscle: No Joint: No Joint: No Joint: No Bone: No Bone: No N/A None None Epithelialization: Treatment Notes Electronic Signature(s) Signed: 01/16/2022 4:51:20 PM By: Duanne Guess MD FACS Signed: 01/16/2022 5:34:25 PM By: Karie Schwalbe RN Entered By: Duanne Guess on 01/16/2022 16:51:20 -------------------------------------------------------------------------------- Multi-Disciplinary  Care Plan Details Patient Name: Date of Service: [facility], [name] 01/16/2022 3:15 PM Medical Record Number: [id number] Patient Account Number: [id number] Date of Birth/Sex: Treating RN: September 24, 1960 (61 y.o. [name] Primary Care Kemon Devincenzi: PCP, NO Other Clinician: Referring Ima Hafner: Treating Ronn Smolinsky/Extender: Cleophus Molt in Treatment: 2 Active Inactive Wound/Skin Impairment Nursing Diagnoses: Knowledge deficit related to ulceration/compromised skin integrity Goals: Patient/caregiver will verbalize understanding of skin care regimen Date Initiated: 01/02/2022 Target Resolution Date: 02/23/2022 Goal Status: Active Interventions: Assess ulceration(s) every visit Treatment Activities: Skin care regimen initiated : 01/02/2022 Notes: Electronic Signature(s) Signed: 01/16/2022 5:34:25 PM By: Karie Schwalbe RN Entered By: Karie Schwalbe on 01/16/2022 17:23:04 -------------------------------------------------------------------------------- Pain Assessment Details Patient Name: Date of Service: [name], [name] 01/16/2022 3:15 PM Medical Record Number: [id number] Patient Account Number: [id number] Date of Birth/Sex: Treating RN: 27-Aug-1960 (61 y.o. [name] Primary Care Whitfield Dulay: PCP, NO Other Clinician: Referring Ahrianna Siglin: Treating Junius Faucett/Extender: Cleophus Molt in Treatment: 2 Active Problems Location of Pain Severity and Description of Pain Patient Has Paino Yes Site Locations Pain Location: Generalized Pain With Dressing Change: Yes Duration of the Pain. Constant / Intermittento Constant Rate the pain. Current Pain Level: 10 Worst Pain Level: 10 Least Pain Level: 7 Tolerable Pain Level: 7 Character of Pain Describe the Pain: Difficult to Pinpoint Pain Management and Medication Current Pain Management: Medication: Yes Cold Application: No Rest: Yes Massage: No Activity: No T.E.N.S.:  No Heat Application: No Leg drop or elevation: No Is the Current Pain Management Adequate: Inadequate How does your wound impact your activities of daily livingo Sleep: No Bathing: No Appetite: No Relationship With Others: No Bladder Continence: No Emotions: No Bowel Continence: No Work: No Toileting: No Drive: No Dressing: No Hobbies: No Electronic Signature(s) Signed: 01/16/2022 5:34:25 PM By: Karie Schwalbe RN Entered By: Karie Schwalbe on 01/16/2022 15:42:06 -------------------------------------------------------------------------------- Patient/Caregiver Education Details Patient Name: Date of Service: [name], [name] 7/24/2023andnbsp3:15 PM Medical Record Number: [id number] Patient Account Number: [id number] Date of Birth/Gender: Treating RN: 11-04-1960 (61 y.o. [name] Primary Care Physician: PCP, NO Other Clinician: Referring Physician: Treating Physician/Extender: Cleophus Molt in Treatment: 2 Education Assessment Education Provided To: Patient Education Topics Provided Wound/Skin Impairment: Methods: Explain/Verbal Responses: Return demonstration correctly Electronic Signature(s) Signed: 01/16/2022 5:34:25 PM By: Karie Schwalbe RN Entered By: Karie Schwalbe on 01/16/2022 17:23:21 -------------------------------------------------------------------------------- Wound Assessment Details Patient Name: Date of Service: [name], [name] 01/16/2022 3:15 PM Medical Record Number: [id number] Patient Account Number:  [id number] Date of Birth/Sex: Treating RN: 06/09/1961 (61 y.o. [name] Primary Care Richa Shor: PCP, NO Other Clinician: Referring Elazar Argabright: Treating Kacee Sukhu/Extender: Cleophus Molt in Treatment: 2 Wound Status Wound Number: 1 Primary Etiology: Pressure Ulcer Wound Location: Sacrum Wound Status: Open Wounding Event: Pressure Injury Date Acquired: 06/16/2021 Weeks  Of Treatment: 2 Clustered Wound: No Photos Wound Measurements Length: (cm) 4.2 Width: (cm) 2.5 Depth: (cm) 3.6 Area: (cm) 8.247 Volume: (cm) 29.688 % Reduction in Area: 10.7% % Reduction in Volume: 19.6% Undermining: Yes Starting Position (o'clock): 6 Ending Position (o'clock): 5 Maximum Distance: (cm) 1.5 Wound Description Classification: Category/Stage IV Exudate Amount: Medium Exudate Type: Serosanguineous Exudate Color: red, brown Foul Odor After Cleansing: No Slough/Fibrino Yes Wound Bed Granulation Amount: Large (67-100%) Exposed Structure Granulation Quality: Red, Hyper-granulation, Friable Fascia Exposed: No Necrotic Amount: Small (1-33%) Fat Layer (Subcutaneous Tissue) Exposed: Yes Necrotic Quality: Adherent Slough Tendon Exposed: No Muscle Exposed: No Joint Exposed: No Bone Exposed: Yes Treatment Notes Wound #1 (Sacrum) Cleanser Soap and Water Discharge Instruction: May shower and wash wound with dial antibacterial soap and water prior to dressing change. Wound Cleanser Discharge Instruction: Cleanse the wound with wound cleanser prior to applying a clean dressing using gauze sponges, not tissue or cotton balls. Peri-Wound Care Topical Primary Dressing Dakin's Solution 0.25%, 16 (oz) Discharge Instruction: Moisten gauze with Dakin's solution Secondary Dressing ABD Pad, 5x9 Discharge Instruction: Apply over primary dressing as directed. Woven Gauze Sponge, Non-Sterile 4x4 in Discharge Instruction: Apply over primary dressing as directed. Secured With [employer] T 2x10 (in/yd) ape Discharge Instruction: Secure with tape as directed. Compression Wrap Compression Stockings Add-Ons Electronic Signature(s) Signed: 01/16/2022 5:34:25 PM By: Karie Schwalbe RN Entered By: Karie Schwalbe on 01/16/2022 15:51:21 -------------------------------------------------------------------------------- Wound Assessment Details Patient  Name: Date of Service: [name], [name] 01/16/2022 3:15 PM Medical Record Number: [id number] Patient Account Number: [id number] Date of Birth/Sex: Treating RN: 1960-07-18 (61 y.o. [name] Primary Care Frenchie Dangerfield: PCP, NO Other Clinician: Referring Shaida Route: Treating Curtis Cain/Extender: Cleophus Molt in Treatment: 2 Wound Status Wound Number: 2 Primary Etiology: Pressure Ulcer Wound Location: Left, Lateral Ankle Wound Status: Open Wounding Event: Pressure Injury Date Acquired: 06/16/2021 Weeks Of Treatment: 2 Clustered Wound: No Photos Wound Measurements Length: (cm) 3 Width: (cm) 2.3 Depth: (cm) 0.2 Area: (cm) 5.419 Volume: (cm) 1.084 % Reduction in Area: -16.2% % Reduction in Volume: -16.2% Epithelialization: None Tunneling: No Undermining: No Wound Description Classification: Category/Stage III Exudate Amount: Medium Exudate Type: Serosanguineous Exudate Color: red, brown Foul Odor After Cleansing: No Slough/Fibrino Yes Wound Bed Granulation Amount: Large (67-100%) Exposed Structure Granulation Quality: Red, Hyper-granulation, Friable Fascia Exposed: No Necrotic Amount: Small (1-33%) Fat Layer (Subcutaneous Tissue) Exposed: Yes Necrotic Quality: Adherent Slough Tendon Exposed: No Muscle Exposed: No Joint Exposed: No Bone Exposed: No Treatment Notes Wound #2 (Ankle) Wound Laterality: Left, Lateral Cleanser Soap and Water Discharge Instruction: May shower and wash wound with dial antibacterial soap and water prior to dressing change. Wound Cleanser Discharge Instruction: Cleanse the wound with wound cleanser prior to applying a clean dressing using gauze sponges, not tissue or cotton balls. Peri-Wound Care Topical Primary Dressing KerraCel Ag Gelling Fiber Dressing, 2x2 in (silver alginate) Discharge Instruction: Apply silver alginate to wound bed as instructed Santyl Ointment Discharge Instruction: Apply nickel thick  amount to wound bed as instructed Secondary Dressing Bordered Gauze, 2x2 in Discharge Instruction: Apply over primary dressing as directed. Woven Gauze Sponges 2x2 in Discharge Instruction: Apply  over primary dressing as directed. Secured With Compression Wrap Compression Stockings Environmental education officer) Signed: 01/16/2022 5:34:25 PM By: Dellie Catholic RN Entered By: Dellie Catholic on 01/16/2022 15:58:28 -------------------------------------------------------------------------------- Wound Assessment Details Patient Name: Date of Service: [facility], [name] 01/16/2022 3:15 PM Medical Record Number: [id number] Patient Account Number: [id number] Date of Birth/Sex: Treating RN: 1960/09/21 (61 y.o. [name] Primary Care Anaiya Wisinski: PCP, NO Other Clinician: Referring Arial Galligan: Treating Collyn Selk/Extender: Jari Favre in Treatment: 2 Wound Status Wound Number: 3 Primary Etiology: Pressure Ulcer Wound Location: Left, Medial Ankle Wound Status: Open Wounding Event: Gradually Appeared Date Acquired: 09/09/2021 Weeks Of Treatment: 2 Clustered Wound: No Photos Wound Measurements Length: (cm) 0.2 Width: (cm) 0.2 Depth: (cm) 0.1 Area: (cm) 0.031 Volume: (cm) 0.003 % Reduction in Area: 56.3% % Reduction in Volume: 57.1% Epithelialization: None Tunneling: No Undermining: No Wound Description Classification: Category/Stage II Wound Margin: Distinct, outline attached Exudate Amount: Medium Exudate Type: Serosanguineous Exudate Color: red, brown Foul Odor After Cleansing: No Slough/Fibrino Yes Wound Bed Granulation Amount: Small (1-33%) Exposed Structure Granulation Quality: Red, Pink Fascia Exposed: No Necrotic Amount: Large (67-100%) Fat Layer (Subcutaneous Tissue) Exposed: Yes Necrotic Quality: Eschar, Adherent Slough Tendon Exposed: No Muscle Exposed: No Joint Exposed: No Bone Exposed: No Treatment Notes Wound #3  (Ankle) Wound Laterality: Left, Medial Cleanser Peri-Wound Care Topical Primary Dressing KerraCel Ag Gelling Fiber Dressing, 2x2 in (silver alginate) Discharge Instruction: Apply silver alginate to wound bed as instructed Secondary Dressing Bordered Gauze, 2x2 in Discharge Instruction: Apply over primary dressing as directed. Secured With Compression Wrap Compression Stockings Environmental education officer) Signed: 01/16/2022 5:34:25 PM By: Dellie Catholic RN Entered By: Dellie Catholic on 01/16/2022 16:01:58 -------------------------------------------------------------------------------- Vitals Details Patient Name: Date of Service: [facility], [name] 01/16/2022 3:15 PM Medical Record Number: [id number] Patient Account Number: [id number] Date of Birth/Sex: Treating RN: 08/07/1960 (61 y.o. [name] Primary Care Roben Schliep: PCP, NO Other Clinician: Referring Averyana Pillars: Treating Shauniece Kwan/Extender: Jari Favre in Treatment: 2 Vital Signs Time Taken: 15:38 Temperature (F): 98.6 Height (in): 67 Pulse (bpm): 64 Weight (lbs): 130 Respiratory Rate (breaths/min): 18 Body Mass Index (BMI): 20.4 Blood Pressure (mmHg): 101/71 Reference Range: 80 - 120 mg / dl Electronic Signature(s) Signed: 01/16/2022 5:34:25 PM By: Dellie Catholic RN Entered By: Dellie Catholic on 01/16/2022 15:41:20

## 2022-01-17 NOTE — Progress Notes (Addendum)
[name] [name], [name]. ([id number]) Visit Report for 01/16/2022 Chief Complaint Document Details Patient Name: Date of Service: [name], [name] 01/16/2022 3:15 PM Medical Record Number: [id number] Patient Account Number: [id number] Date of Birth/Sex: Treating RN: February 08, 1961 (61 y.o. [name] [name] Primary Care Provider: PCP, NO Other Clinician: Referring Provider: Treating Provider/Extender: [name] [name] in Treatment: 2 Information Obtained from: Patient Chief Complaint Patient is at the clinic for treatment of multiple open pressure ulcers Electronic Signature(s) Signed: 01/16/2022 4:51:27 PM By: [name] [name] Entered By: [name] [name] on 01/16/2022 16:51:27 -------------------------------------------------------------------------------- Debridement Details Patient Name: Date of Service: [name] [name], [name] 01/16/2022 3:15 PM Medical Record Number: [id number] Patient Account Number: [id number] Date of Birth/Sex: Treating RN: [birth year] (61 y.o. [name] [name] Primary Care Provider: PCP, NO Other Clinician: Referring Provider: Treating Provider/Extender: [name] [name] in Treatment: 2 Debridement Performed for Assessment: Wound #3 Left,Medial Ankle Performed By: Physician [name] Maudlin, MD Debridement Type: Debridement Level of Consciousness (Pre-procedure): Awake and Alert Pre-procedure Verification/Time Out Yes - 16:15 Taken: Start Time: 16:15 Pain Control: Lidocaine 5% topical ointment T Area Debrided (L x W): otal 0.2 (cm) x 0.2 (cm) = 0.04 (cm) Tissue and other material debrided: Non-Viable, Eschar Level: Non-Viable Tissue Debridement Description: Selective/Open Wound Instrument: Curette Bleeding: Minimum Hemostasis Achieved: Pressure End Time: 16:17 Procedural Pain: 0 Post Procedural Pain: 0 Response to Treatment: Procedure was tolerated well Level of Consciousness (Post- Awake and  Alert procedure): Post Debridement Measurements of Total Wound Length: (cm) 0.2 Stage: Category/Stage II Width: (cm) 0.2 Depth: (cm) 0.1 Volume: (cm) 0.003 Character of Wound/Ulcer Post Debridement: Improved Post Procedure Diagnosis Same as Pre-procedure Electronic Signature(s) Signed: 01/16/2022 5:34:25 PM By: [name] Catholic RN Signed: 01/17/2022 7:34:06 AM By: [name] [name] Entered By: [name] [name] on 01/16/2022 17:22:14 -------------------------------------------------------------------------------- HPI Details Patient Name: Date of Service: [name] [name], [name] 01/16/2022 3:15 PM Medical Record Number: [id number] Patient Account Number: [id number] Date of Birth/Sex: Treating RN: [birth year] (61 y.o. [name] [name] Primary Care Provider: PCP, NO Other Clinician: Referring Provider: Treating Provider/Extender: [name] [name] in Treatment: 2 History of Present Illness HPI Description: ADMISSION 01/02/2022 This is [name] 61 year old woman with [name] past medical history significant for [name] stroke that has left her bedbound and with significant contractures. 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 I am unclear as to how long her sacral pressure ulcer has been present, but there is bone exposed and apparently she was having more drainage from the site. She has also developed [name] stage III pressure ulcer on her left lateral ankle and [name] stage II pressure ulcer on her left medial ankle. She is not diabetic, but does have [name] smoking history and continues to smoke about half [name] pack [name] day. ABIs were performed and were within normal limits. Antibiotics were prescribed for [name] total course of 6 weeks. She has been followed by infectious disease and last saw them on December 20, 2021. Infectious disease referred her to the wound care center for evaluation and management. The patient resists frequent turning  because of chronic pain issues. The aide from her facility states that she is on [name] low-air-loss mattress. The patient says she has not been permitting dressing changes for the past few days because of her pain. The dressings that were removed from her sacral wound were putrid. She does state that she drinks 2-3  protein shakes such as boost or Ensure daily and she is also receiving Pro-Stat at her facility. 01/16/2022: She has not received [name] level 3 surface and is still on [name] level 2. She says she is drinking protein shakes; she has [name] Boost with her today but it only has 10 g of protein. The left medial ankle wound has [name] little bit of eschar and just [name] tiny open area. The left lateral ankle wound has hypertrophic granulation tissue. The sacral wound is perhaps [name] little bit smaller but still has bone frankly exposed. Electronic Signature(s) Signed: 01/16/2022 4:52:58 PM By: [name] [name] Entered By: [name] [name] on 01/16/2022 16:52:58 -------------------------------------------------------------------------------- Chemical Cauterization Details Patient Name: Date of Service: [name] [name], [name] 01/16/2022 3:15 PM Medical Record Number: [id number] Patient Account Number: [id number] Date of Birth/Sex: Treating RN: 06-06-1961 (61 y.o. [name] [name] Primary Care Provider: PCP, NO Other Clinician: Referring Provider: Treating Provider/Extender: [name] [name] in Treatment: 2 Procedure Performed for: Wound #2 Left,Lateral Ankle Performed By: Physician [name] Guess, MD Post Procedure Diagnosis Same as Pre-procedure Electronic Signature(s) Signed: 01/16/2022 5:34:25 PM By: Karie Schwalbe RN Signed: 01/17/2022 7:34:06 AM By: [name] [name] Signed: 01/17/2022 7:34:06 AM By: [name] [name] Entered By: Karie Schwalbe on 01/16/2022 17:22:50 -------------------------------------------------------------------------------- Physical Exam  Details Patient Name: Date of Service: [name], [name] 01/16/2022 3:15 PM Medical Record Number: [id number] Patient Account Number: [id number] Date of Birth/Sex: Treating RN: 1960/09/15 (61 y.o. [name] [name] Primary Care Provider: PCP, NO Other Clinician: Referring Provider: Treating Provider/Extender: [name] [name] in Treatment: 2 Constitutional . . . . No acute distress.Marland Kitchen Respiratory Normal work of breathing on room air.. Notes 01/16/2022: The left medial ankle wound has [name] little bit of eschar and just [name] tiny open area. The left lateral ankle wound has hypertrophic granulation tissue. The sacral wound is perhaps [name] little bit smaller but still has bone frankly exposed. Electronic Signature(s) Signed: 01/16/2022 4:53:37 PM By: [name] [name] Entered By: [name] [name] on 01/16/2022 16:53:37 -------------------------------------------------------------------------------- Physician Orders Details Patient Name: Date of Service: [name] [name], [name] 01/16/2022 3:15 PM Medical Record Number: [id number] Patient Account Number: [id number] Date of Birth/Sex: Treating RN: [birth year] (61 y.o. [name] [name] Primary Care Provider: PCP, NO Other Clinician: Referring Provider: Treating Provider/Extender: [name] [name] in Treatment: 2 Verbal / Phone Orders: No Diagnosis Coding ICD-10 Coding Code Description L89.154 Pressure ulcer of sacral region, stage 4 L89.522 Pressure ulcer of left ankle, stage 2 L89.523 Pressure ulcer of left ankle, stage 3 M46.28 Osteomyelitis of vertebra, sacral and sacrococcygeal region Z86.73 Personal history of transient ischemic attack (TIA), and cerebral infarction without residual deficits E44.0 Moderate protein-calorie malnutrition Follow-up Appointments ppointment in 2 weeks. - ***** HOYER********Dr. Lady Gary Room 3 next appointment Monday 01/30/22 at 3:15pm Return [name] Please come by stretcher  to the Wound Center. Off-Loading [name] fluidized (Group 3) mattress - Level 3 mattress ir Other: - Continue wearing Bunny boots or something similar like prevalon boots and offload pressure points Additional Orders / Instructions Follow Nutritious Diet - Keep doing Protein shakes and Prostat Wound Treatment Wound #1 - Sacrum Cleanser: Soap and Water 1 x Per Day/30 Days Discharge Instructions: May shower and wash wound with dial antibacterial soap and water prior to dressing change. Cleanser: Wound Cleanser 1 x Per Day/30 Days Discharge Instructions: Cleanse the wound with wound cleanser prior to applying [name] clean dressing using gauze sponges, not  tissue or cotton balls. Prim Dressing: Dakin's Solution 0.25%, 16 (oz) 1 x Per Day/30 Days ary Discharge Instructions: Moisten gauze with Dakin's solution Secondary Dressing: ABD Pad, 5x9 1 x Per Day/30 Days Discharge Instructions: Apply over primary dressing as directed. Secondary Dressing: Woven Gauze Sponge, Non-Sterile 4x4 in 1 x Per Day/30 Days Discharge Instructions: Apply over primary dressing as directed. Secured With: 66M Medipore Scientist, research (life sciences) Surgical T 2x10 (in/yd) 1 x Per Day/30 Days ape Discharge Instructions: Secure with tape as directed. Wound #2 - Ankle Wound Laterality: Left, Lateral Cleanser: Soap and Water 1 x Per Day/30 Days Discharge Instructions: May shower and wash wound with dial antibacterial soap and water prior to dressing change. Cleanser: Wound Cleanser 1 x Per Day/30 Days Discharge Instructions: Cleanse the wound with wound cleanser prior to applying [name] clean dressing using gauze sponges, not tissue or cotton balls. Prim Dressing: KerraCel Ag Gelling Fiber Dressing, 2x2 in (silver alginate) 1 x Per Day/30 Days ary Discharge Instructions: Apply silver alginate to wound bed as instructed Prim Dressing: Santyl Ointment 1 x Per Day/30 Days ary Discharge Instructions: Apply nickel thick amount to wound bed as  instructed Secondary Dressing: Bordered Gauze, 2x2 in 1 x Per Day/30 Days Discharge Instructions: Apply over primary dressing as directed. Secondary Dressing: Woven Gauze Sponges 2x2 in 1 x Per Day/30 Days Discharge Instructions: Apply over primary dressing as directed. Wound #3 - Ankle Wound Laterality: Left, Medial Prim Dressing: KerraCel Ag Gelling Fiber Dressing, 2x2 in (silver alginate) 1 x Per Day/30 Days ary Discharge Instructions: Apply silver alginate to wound bed as instructed Secondary Dressing: Bordered Gauze, 2x2 in 1 x Per Day/30 Days Discharge Instructions: Apply over primary dressing as directed. Electronic Signature(s) Signed: 01/16/2022 5:07:36 PM By: [name] [name] Entered By: [name] [name] on 01/16/2022 16:53:54 -------------------------------------------------------------------------------- Problem List Details Patient Name: Date of Service: [name] [name], [name] 01/16/2022 3:15 PM Medical Record Number: [id number] Patient Account Number: [id number] Date of Birth/Sex: Treating RN: 1960-09-13 (61 y.o. [name] [name] Primary Care Provider: PCP, NO Other Clinician: Referring Provider: Treating Provider/Extender: [name] [name] in Treatment: 2 Active Problems ICD-10 Encounter Code Description Active Date MDM Diagnosis L89.154 Pressure ulcer of sacral region, stage 4 01/02/2022 No Yes L89.522 Pressure ulcer of left ankle, stage 2 01/02/2022 No Yes L89.523 Pressure ulcer of left ankle, stage 3 01/02/2022 No Yes M46.28 Osteomyelitis of vertebra, sacral and sacrococcygeal region 01/02/2022 No Yes Z86.73 Personal history of transient ischemic attack (TIA), and cerebral infarction 01/02/2022 No Yes without residual deficits E44.0 Moderate protein-calorie malnutrition 01/02/2022 No Yes Inactive Problems Resolved Problems Electronic Signature(s) Signed: 01/16/2022 4:51:12 PM By: [name] [name] Entered By: [name] [name] on  01/16/2022 16:51:12 -------------------------------------------------------------------------------- Progress Note Details Patient Name: Date of Service: [name] [name], [name] 01/16/2022 3:15 PM Medical Record Number: [id number] Patient Account Number: [id number] Date of Birth/Sex: Treating RN: [birth year] (61 y.o. [name] [name] Primary Care Provider: PCP, NO Other Clinician: Referring Provider: Treating Provider/Extender: [name] [name] in Treatment: 2 Subjective Chief Complaint Information obtained from Patient Patient is at the clinic for treatment of multiple open pressure ulcers History of Present Illness (HPI) ADMISSION 01/02/2022 This is [name] 61 year old woman with [name] past medical history significant for [name] stroke that has left her bedbound and with significant contractures. 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 I am unclear as to how long her sacral pressure  ulcer has been present, but there is bone exposed and apparently she was having more drainage from the site. She has also developed [name] stage III pressure ulcer on her left lateral ankle and [name] stage II pressure ulcer on her left medial ankle. She is not diabetic, but does have [name] smoking history and continues to smoke about half [name] pack [name] day. ABIs were performed and were within normal limits. Antibiotics were prescribed for [name] total course of 6 weeks. She has been followed by infectious disease and last saw them on December 20, 2021. Infectious disease referred her to the wound care center for evaluation and management. The patient resists frequent turning because of chronic pain issues. The aide from her facility states that she is on [name] low-air-loss mattress. The patient says she has not been permitting dressing changes for the past few days because of her pain. The dressings that were removed from her sacral wound were putrid. She does  state that she drinks 2-3 protein shakes such as boost or Ensure daily and she is also receiving Pro-Stat at her facility. 01/16/2022: She has not received [name] level 3 surface and is still on [name] level 2. She says she is drinking protein shakes; she has [name] Boost with her today but it only has 10 g of protein. The left medial ankle wound has [name] little bit of eschar and just [name] tiny open area. The left lateral ankle wound has hypertrophic granulation tissue. The sacral wound is perhaps [name] little bit smaller but still has bone frankly exposed. Patient History Information obtained from Patient. Family History Unknown History. Social History Current some day smoker, Marital Status - Single, Alcohol Use - Never, Drug Use - No History, Caffeine Use - Rarely. Medical [name] Surgical History Notes nd Cardiovascular CVA 2001 Psychiatric Bipolar disorder Objective Constitutional No acute distress.. Vitals Time Taken: 3:38 PM, Height: 67 in, Weight: 130 lbs, BMI: 20.4, Temperature: 98.6 F, Pulse: 64 bpm, Respiratory Rate: 18 breaths/min, Blood Pressure: 101/71 mmHg. Respiratory Normal work of breathing on room air.. General Notes: 01/16/2022: The left medial ankle wound has [name] little bit of eschar and just [name] tiny open area. The left lateral ankle wound has hypertrophic granulation tissue. The sacral wound is perhaps [name] little bit smaller but still has bone frankly exposed. Integumentary (Hair, Skin) Wound #1 status is Open. Original cause of wound was Pressure Injury. The date acquired was: 06/16/2021. The wound has been in treatment 2 weeks. The wound is located on the Sacrum. The wound measures 4.2cm length x 2.5cm width x 3.6cm depth; 8.247cm^2 area and 29.688cm^3 volume. There is bone and Fat Layer (Subcutaneous Tissue) exposed. There is undermining starting at 6:00 and ending at 5:00 with [name] maximum distance of 1.5cm. There is [name] medium amount of serosanguineous drainage noted. There is large (67-100%) red,  friable, hyper - granulation within the wound bed. There is [name] small (1-33%) amount of necrotic tissue within the wound bed including Adherent Slough. Wound #2 status is Open. Original cause of wound was Pressure Injury. The date acquired was: 06/16/2021. The wound has been in treatment 2 weeks. The wound is located on the Left,Lateral Ankle. The wound measures 3cm length x 2.3cm width x 0.2cm depth; 5.419cm^2 area and 1.084cm^3 volume. There is Fat Layer (Subcutaneous Tissue) exposed. There is no tunneling or undermining noted. There is [name] medium amount of serosanguineous drainage noted. There is large (67-100%) red, friable, hyper - granulation within the wound bed. There is [name] small (  1-33%) amount of necrotic tissue within the wound bed including Adherent Slough. Wound #3 status is Open. Original cause of wound was Gradually Appeared. The date acquired was: 09/09/2021. The wound has been in treatment 2 weeks. The wound is located on the Left,Medial Ankle. The wound measures 0.2cm length x 0.2cm width x 0.1cm depth; 0.031cm^2 area and 0.003cm^3 volume. There is Fat Layer (Subcutaneous Tissue) exposed. There is no tunneling or undermining noted. There is [name] medium amount of serosanguineous drainage noted. The wound margin is distinct with the outline attached to the wound base. There is small (1-33%) red, pink granulation within the wound bed. There is [name] large (67- 100%) amount of necrotic tissue within the wound bed including Eschar and Adherent Slough. Assessment Active Problems ICD-10 Pressure ulcer of sacral region, stage 4 Pressure ulcer of left ankle, stage 2 Pressure ulcer of left ankle, stage 3 Osteomyelitis of vertebra, sacral and sacrococcygeal region Personal history of transient ischemic attack (TIA), and cerebral infarction without residual deficits Moderate protein-calorie malnutrition Procedures Wound #3 Pre-procedure diagnosis of Wound #3 is [name] Pressure Ulcer located on the  Left,Medial Ankle . There was [name] Selective/Open Wound Non-Viable Tissue Debridement with [name] total area of 0.04 sq cm performed by [name] Maudlin, MD. With the following instrument(s): Curette to remove Non-Viable tissue/material. Material removed includes Eschar after achieving pain control using Lidocaine 5% topical ointment. No specimens were taken. [name] time out was conducted at 16:15, prior to the start of the procedure. [name] Minimum amount of bleeding was controlled with Pressure. The procedure was tolerated well with [name] pain level of 0 throughout and [name] pain level of 0 following the procedure. Post Debridement Measurements: 0.2cm length x 0.2cm width x 0.1cm depth; 0.003cm^3 volume. Post debridement Stage noted as Category/Stage II. Character of Wound/Ulcer Post Debridement is improved. Post procedure Diagnosis Wound #3: Same as Pre-Procedure Wound #2 Pre-procedure diagnosis of Wound #2 is [name] Pressure Ulcer located on the Left,Lateral Ankle . An Chemical Cauterization procedure was performed by [name] Maudlin, MD. Post procedure Diagnosis Wound #2: Same as Pre-Procedure Plan Follow-up Appointments: Return Appointment in 2 weeks. - ***** HOYER********Dr. Celine Ahr Room 3 next appointment Monday 01/30/22 at 3:15pm Please come by stretcher to the [facility]. Off-Loading: Air fluidized (Group 3) mattress - Level 3 mattress Other: - Continue wearing Bunny boots or something similar like prevalon boots and offload pressure points Additional Orders / Instructions: Follow Nutritious Diet - Keep doing Protein shakes and Prostat WOUND #1: - Sacrum Wound Laterality: Cleanser: Soap and Water 1 x Per Day/30 Days Discharge Instructions: May shower and wash wound with dial antibacterial soap and water prior to dressing change. Cleanser: Wound Cleanser 1 x Per Day/30 Days Discharge Instructions: Cleanse the wound with wound cleanser prior to applying [name] clean dressing using gauze sponges, not tissue or cotton  balls. Prim Dressing: Dakin's Solution 0.25%, 16 (oz) 1 x Per Day/30 Days ary Discharge Instructions: Moisten gauze with Dakin's solution Secondary Dressing: ABD Pad, 5x9 1 x Per Day/30 Days Discharge Instructions: Apply over primary dressing as directed. Secondary Dressing: Woven Gauze Sponge, Non-Sterile 4x4 in 1 x Per Day/30 Days Discharge Instructions: Apply over primary dressing as directed. Secured With: 44M Medipore Public affairs consultant Surgical T 2x10 (in/yd) 1 x Per Day/30 Days ape Discharge Instructions: Secure with tape as directed. WOUND #2: - Ankle Wound Laterality: Left, Lateral Cleanser: Soap and Water 1 x Per Day/30 Days Discharge Instructions: May shower and wash wound with dial antibacterial soap and water prior to  dressing change. Cleanser: Wound Cleanser 1 x Per Day/30 Days Discharge Instructions: Cleanse the wound with wound cleanser prior to applying [name] clean dressing using gauze sponges, not tissue or cotton balls. Prim Dressing: KerraCel Ag Gelling Fiber Dressing, 2x2 in (silver alginate) 1 x Per Day/30 Days ary Discharge Instructions: Apply silver alginate to wound bed as instructed Prim Dressing: Santyl Ointment 1 x Per Day/30 Days ary Discharge Instructions: Apply nickel thick amount to wound bed as instructed Secondary Dressing: Bordered Gauze, 2x2 in 1 x Per Day/30 Days Discharge Instructions: Apply over primary dressing as directed. Secondary Dressing: Woven Gauze Sponges 2x2 in 1 x Per Day/30 Days Discharge Instructions: Apply over primary dressing as directed. WOUND #3: - Ankle Wound Laterality: Left, Medial Prim Dressing: KerraCel Ag Gelling Fiber Dressing, 2x2 in (silver alginate) 1 x Per Day/30 Days ary Discharge Instructions: Apply silver alginate to wound bed as instructed Secondary Dressing: Bordered Gauze, 2x2 in 1 x Per Day/30 Days Discharge Instructions: Apply over primary dressing as directed. 01/16/2022: The left medial ankle wound has [name] little bit of  eschar and just [name] tiny open area. The left lateral ankle wound has hypertrophic granulation tissue. The sacral wound is perhaps [name] little bit smaller but still has bone frankly exposed. I used [name] curette to debride the eschar from the left medial ankle wound. I used silver nitrate to chemically cauterize the hypertrophic granulation tissue on the lateral ankle wound. She needs to continue wearing her Prevalon boots at all times. The sacral wound did not require any debridement or other intervention. We will continue using Dakin's solution to pack the wound. We may be able to transition her to [name] wound VAC in the near future. She desperately needs to be on [name] different surface as she does not tolerate the number of turns necessary to properly offload her. In addition, I recommend that she drink protein shakes that have [name] higher number of grams of protein per volume such as Premier protein. Follow-up in 2 weeks. Electronic Signature(s) Signed: 01/17/2022 8:13:48 AM By: [name] [name] Previous Signature: 01/16/2022 4:56:42 PM Version By: [name] [name] Entered By: [name] [name] on 01/17/2022 08:13:48 -------------------------------------------------------------------------------- HxROS Details Patient Name: Date of Service: [name] [name], [name] 01/16/2022 3:15 PM Medical Record Number: [id number] Patient Account Number: [id number] Date of Birth/Sex: Treating RN: [birth year] (61 y.o. [name] [name] Primary Care Provider: PCP, NO Other Clinician: Referring Provider: Treating Provider/Extender: [name] [name] in Treatment: 2 Information Obtained From Patient Cardiovascular Medical History: Past Medical History Notes: CVA 2001 Psychiatric Medical History: Past Medical History Notes: Bipolar disorder Immunizations Pneumococcal Vaccine: Received Pneumococcal Vaccination: No Implantable Devices No devices added Family and Social History Unknown  History: Yes; Current some day smoker; Marital Status - Single; Alcohol Use: Never; Drug Use: No History; Caffeine Use: Rarely; Financial Concerns: No; Food, Clothing or Shelter Needs: No; Support System Lacking: No; Transportation Concerns: No Engineer, maintenance) Signed: 01/16/2022 5:07:36 PM By: [name] [name] Signed: 01/16/2022 5:34:25 PM By: [name] Catholic RN Entered By: [name] [name] on 01/16/2022 16:53:03 -------------------------------------------------------------------------------- SuperBill Details Patient Name: Date of Service: [facility], [name] 01/16/2022 Medical Record Number: [id number] Patient Account Number: [id number] Date of Birth/Sex: Treating RN: [birth year] (61 y.o. 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), and cerebral infarction without residual deficits E44.0 Moderate protein-calorie malnutrition Facility Procedures CPT4 Code: [id number] Description: [phone] - DEBRIDE WOUND 1ST 20 SQ CM OR < ICD-10 Diagnosis Description L89.522 Pressure ulcer of left ankle, stage 2 Modifier: Quantity: 1 CPT4 Code: [id number] Description: [id number] - CHEM CAUT GRANULATION TISS ICD-10 Diagnosis Description L89.523 Pressure ulcer of left ankle, stage 3 Modifier: Quantity: 1 Physician Procedures : CPT4 Code Description Modifier [id number] - WC PHYS LEVEL 3 - EST PT ICD-10 Diagnosis Description L89.522 Pressure ulcer of left ankle, stage 2 L89.154 Pressure ulcer of sacral region, stage 4 L89.523 Pressure ulcer of left ankle, stage 3  M46.28  Osteomyelitis of vertebra, sacral and sacrococcygeal region Quantity: 1 : [id number] - WC PHYS DEBR WO ANESTH 20 SQ CM ICD-10 Diagnosis Description L89.522 Pressure ulcer of left ankle, stage 2 Quantity: 1 : [id number] - WC PHYS CHEM CAUT GRAN TISSUE ICD-10 Diagnosis Description L89.523 Pressure ulcer of left ankle, stage 3 Quantity: 1 Electronic Signature(s) Signed: 01/16/2022 4:57:32 PM By: [name] [name] Entered By: [name] [name] on 01/16/2022 16:57:31

## 2022-01-30 ENCOUNTER — Encounter (HOSPITAL_BASED_OUTPATIENT_CLINIC_OR_DEPARTMENT_OTHER): Payer: Medicaid Other | Admitting: General Surgery

## 2022-02-06 ENCOUNTER — Encounter (HOSPITAL_BASED_OUTPATIENT_CLINIC_OR_DEPARTMENT_OTHER): Payer: Medicaid Other | Attending: General Surgery | Admitting: General Surgery

## 2022-02-06 DIAGNOSIS — E440 Moderate protein-calorie malnutrition: Secondary | ICD-10-CM | POA: Diagnosis not present

## 2022-02-06 DIAGNOSIS — F17210 Nicotine dependence, cigarettes, uncomplicated: Secondary | ICD-10-CM | POA: Diagnosis not present

## 2022-02-06 DIAGNOSIS — M4628 Osteomyelitis of vertebra, sacral and sacrococcygeal region: Secondary | ICD-10-CM | POA: Diagnosis not present

## 2022-02-06 DIAGNOSIS — Z8673 Personal history of transient ischemic attack (TIA), and cerebral infarction without residual deficits: Secondary | ICD-10-CM | POA: Diagnosis not present

## 2022-02-06 DIAGNOSIS — L89154 Pressure ulcer of sacral region, stage 4: Secondary | ICD-10-CM | POA: Insufficient documentation

## 2022-02-06 DIAGNOSIS — L89523 Pressure ulcer of left ankle, stage 3: Secondary | ICD-10-CM | POA: Diagnosis not present

## 2022-02-06 DIAGNOSIS — L89522 Pressure ulcer of left ankle, stage 2: Secondary | ICD-10-CM | POA: Insufficient documentation

## 2022-02-06 DIAGNOSIS — Z6841 Body Mass Index (BMI) 40.0 and over, adult: Secondary | ICD-10-CM | POA: Insufficient documentation

## 2022-02-10 NOTE — Progress Notes (Signed)
Arca, Kalan L. ([id number]) Visit Report for 02/06/2022 Arrival Information Details Patient Name: Date of Service: [facility], [name] 02/06/2022 10:15 A M Medical Record Number: [id number] Patient Account Number: [id number] Date of Birth/Sex: Treating RN: 1960/10/01 (61 y.o. [name] Primary Care Sarahanne Novakowski: PCP, NO Other Clinician: Referring Delailah Spieth: Treating Hanif Radin/Extender: Cleophus Molt in Treatment: 5 Visit Information History Since Last Visit Added or deleted any medications: No Patient Arrived: Stretcher Any new allergies or adverse reactions: No Arrival Time: 10:27 Had a fall or experienced change in No Accompanied By: self activities of daily living that may affect Transfer Assistance: Manual risk of falls: Patient Identification Verified: Yes Signs or symptoms of abuse/neglect since last visito No Patient Has Alerts: Yes Hospitalized since last visit: No Patient Alerts: ABI R 0.86 Implantable device outside of the clinic excluding No ABI L 1.04 cellular tissue based products placed in the center TBI R 0.71 since last visit: TBI L 0.78 Has Dressing in Place as Prescribed: Yes Pain Present Now: No Electronic Signature(s) Signed: 02/06/2022 4:37:22 PM By: Karie Schwalbe RN Entered By: Karie Schwalbe on 02/06/2022 10:27:58 -------------------------------------------------------------------------------- Encounter Discharge Information Details Patient Name: Date of Service: [facility], [name] 02/06/2022 10:15 A M Medical Record Number: [id number] Patient Account Number: [id number] Date of Birth/Sex: Treating RN: [birth year] (61 y.o. [name] Primary Care Sharece Fleischhacker: PCP, NO Other Clinician: Referring Lorrin Nawrot: Treating Christin Moline/Extender: Cleophus Molt in Treatment: 5 Encounter Discharge Information Items Post Procedure Vitals Discharge Condition: Stable Temperature (F): 98.5 Ambulatory Status:  Stretcher Pulse (bpm): 74 Discharge Destination: Skilled Nursing Facility Respiratory Rate (breaths/min): 18 Telephoned: No Blood Pressure (mmHg): 105/72 Orders Sent: Yes Transportation: Ambulance Accompanied By: self Schedule Follow-up Appointment: Yes Clinical Summary of Care: Patient Declined Notes Transported to and from clinic by [facility] Electronic Signature(s) Signed: 02/06/2022 4:37:22 PM By: Karie Schwalbe RN Entered By: Karie Schwalbe on 02/06/2022 11:43:32 -------------------------------------------------------------------------------- Lower Extremity Assessment Details Patient Name: Date of Service: [facility], [name] 02/06/2022 10:15 A M Medical Record Number: [id number] Patient Account Number: [id number] Date of Birth/Sex: Treating RN: 07-Jul-1960 (61 y.o. [name] Primary Care Chaniece Barbato: PCP, NO Other Clinician: Referring Alinna Siple: Treating Jarrick Fjeld/Extender: Cleophus Molt in Treatment: 5 Edema Assessment Assessed: [Left: No] [Right: No] E[Left: dema] [Right: :] Calf Left: Right: Point of Measurement: 34 cm From Medial Instep 31 cm Ankle Left: Right: Point of Measurement: 12 cm From Medial Instep 22.7 cm Electronic Signature(s) Signed: 02/06/2022 4:37:22 PM By: Karie Schwalbe RN Entered By: Karie Schwalbe on 02/06/2022 10:45:48 -------------------------------------------------------------------------------- Multi Wound Chart Details Patient Name: Date of Service: [name], [name] 02/06/2022 10:15 A M Medical Record Number: [id number] Patient Account Number: [id number] Date of Birth/Sex: Treating RN: [birth year] (61 y.o. [name] Primary Care Taydem Cavagnaro: PCP, NO Other Clinician: Referring Adalaya Irion: Treating Sourish Allender/Extender: Cleophus Molt in Treatment: 5 Vital Signs Height(in): 67 Pulse(bpm): 74 Weight(lbs): 130 Blood Pressure(mmHg): 105/72 Body Mass Index(BMI):  20.4 Temperature(F): 98.5 Respiratory Rate(breaths/min): 18 Photos: Sacrum Left, Lateral Ankle Left, Medial Ankle Wound Location: Pressure Injury Pressure Injury Gradually Appeared Wounding Event: Pressure Ulcer Pressure Ulcer Pressure Ulcer Primary Etiology: 06/16/2021 06/16/2021 09/09/2021 Date Acquired: 5 5 5  Weeks of Treatment: Open Open Healed - Epithelialized Wound Status: No No No Wound Recurrence: 4.1x2.5x2.6 3x2x0.2 0x0x0 Measurements L x W x D (cm) 8.05 4.712 0 A (cm) : rea 20.931 0.942 0 Volume (cm) : 12.80% -1.00% 100.00% % Reduction in A rea: 43.30% -  1.00% 100.00% % Reduction in Volume: Starting Position 1 (o'clock): 12 Ending Position 1 (o'clock): 1.5 Maximum Distance 1 (cm): Yes No No Undermining: Category/Stage IV Category/Stage III Category/Stage II Classification: Medium Medium None Present Exudate A mount: Serosanguineous Serosanguineous N/A Exudate Type: red, brown red, brown N/A Exudate Color: N/A N/A Distinct, outline attached Wound Margin: Large (67-100%) Large (67-100%) None Present (0%) Granulation A mount: Red, Hyper-granulation, Friable Red, Hyper-granulation, Friable N/A Granulation Quality: Small (1-33%) Small (1-33%) None Present (0%) Necrotic A mount: Eschar, Adherent Slough Adherent Slough N/A Necrotic Tissue: Fat Layer (Subcutaneous Tissue): Yes Fat Layer (Subcutaneous Tissue): Yes Fascia: No Exposed Structures: Fascia: No Fascia: No Fat Layer (Subcutaneous Tissue): No Tendon: No Tendon: No Tendon: No Muscle: No Muscle: No Muscle: No Joint: No Joint: No Joint: No Bone: No Bone: No Bone: No N/A None None Epithelialization: Debridement - Excisional Debridement - Excisional N/A Debridement: Pre-procedure Verification/Time Out 10:50 10:50 N/A Taken: Lidocaine 5% topical ointment Lidocaine 5% topical ointment N/A Pain Control: Subcutaneous, Slough Subcutaneous, Slough N/A Tissue Debrided: Skin/Subcutaneous  Tissue Skin/Subcutaneous Tissue N/A Level: 10.25 6 N/A Debridement A (sq cm): rea Curette Curette N/A Instrument: Minimum Minimum N/A Bleeding: Pressure Pressure N/A Hemostasis A chieved: 0 0 N/A Procedural Pain: 0 0 N/A Post Procedural Pain: Procedure was tolerated well Procedure was tolerated well N/A Debridement Treatment Response: 4.1x2.5x2.6 3x2x0.2 N/A Post Debridement Measurements L x W x D (cm) 20.931 0.942 N/A Post Debridement Volume: (cm) Category/Stage IV Category/Stage III N/A Post Debridement Stage: Debridement Debridement N/A Procedures Performed: Treatment Notes Electronic Signature(s) Signed: 02/06/2022 11:03:59 AM By: Duanne Guess MD FACS Signed: 02/06/2022 4:37:22 PM By: Karie Schwalbe RN Entered By: Duanne Guess on 02/06/2022 11:03:59 -------------------------------------------------------------------------------- Multi-Disciplinary Care Plan Details Patient Name: Date of Service: [facility], [name] 02/06/2022 10:15 A M Medical Record Number: [id number] Patient Account Number: [id number] Date of Birth/Sex: Treating RN: [birth year] (61 y.o. [name] Primary Care Chastin Riesgo: PCP, NO Other Clinician: Referring Breelle Hollywood: Treating Burney Calzadilla/Extender: Cleophus Molt in Treatment: 5 Active Inactive Wound/Skin Impairment Nursing Diagnoses: Knowledge deficit related to ulceration/compromised skin integrity Goals: Patient/caregiver will verbalize understanding of skin care regimen Date Initiated: 01/02/2022 Target Resolution Date: 04/25/2022 Goal Status: Active Interventions: Assess ulceration(s) every visit Treatment Activities: Skin care regimen initiated : 01/02/2022 Notes: Electronic Signature(s) Signed: 02/06/2022 4:37:22 PM By: Karie Schwalbe RN Entered By: Karie Schwalbe on 02/06/2022 11:40:27 -------------------------------------------------------------------------------- Pain Assessment  Details Patient Name: Date of Service: [name], [name] 02/06/2022 10:15 A M Medical Record Number: [id number] Patient Account Number: [id number] Date of Birth/Sex: Treating RN: August 23, 1960 (61 y.o. [name] Primary Care Chikita Dogan: PCP, NO Other Clinician: Referring Marshea Wisher: Treating Felcia Huebert/Extender: Cleophus Molt in Treatment: 5 Active Problems Location of Pain Severity and Description of Pain Patient Has Paino No Site Locations Pain Management and Medication Current Pain Management: Electronic Signature(s) Signed: 02/06/2022 4:37:22 PM By: Karie Schwalbe RN Entered By: Karie Schwalbe on 02/06/2022 10:29:04 -------------------------------------------------------------------------------- Patient/Caregiver Education Details Patient Name: Date of Service: [name], [name] 8/14/2023andnbsp10:15 A M Medical Record Number: [id number] Patient Account Number: [id number] Date of Birth/Gender: Treating RN: 28-Sep-1960 (61 y.o. [name] Primary Care Physician: PCP, NO Other Clinician: Referring Physician: Treating Physician/Extender: Cleophus Molt in Treatment: 5 Education Assessment Education Provided To: Patient Education Topics Provided Wound/Skin Impairment: Methods: Explain/Verbal Responses: Return demonstration correctly Electronic Signature(s) Signed: 02/06/2022 4:37:22 PM By: Karie Schwalbe RN Entered By: Karie Schwalbe on 02/06/2022 11:40:57 -------------------------------------------------------------------------------- Wound Assessment Details  Patient Name: Date of Service: [facility], [name] 02/06/2022 10:15 A M Medical Record Number: [id number] Patient Account Number: [id number] Date of Birth/Sex: Treating RN: 1960/11/14 (61 y.o. [name] Primary Care Saheed Carrington: PCP, NO Other Clinician: Referring Shanieka Blea: Treating John Vasconcelos/Extender: Cleophus Molt  in Treatment: 5 Wound Status Wound Number: 1 Primary Etiology: Pressure Ulcer Wound Location: Sacrum Wound Status: Open Wounding Event: Pressure Injury Date Acquired: 06/16/2021 Weeks Of Treatment: 5 Clustered Wound: No Photos Wound Measurements Length: (cm) 4.1 Width: (cm) 2.5 Depth: (cm) 2.6 Area: (cm) 8.05 Volume: (cm) 20.931 % Reduction in Area: 12.8% % Reduction in Volume: 43.3% Undermining: Yes Ending Position (o'clock): 12 Maximum Distance: (cm) 1.5 Wound Description Classification: Category/Stage IV Exudate Amount: Medium Exudate Type: Serosanguineous Exudate Color: red, brown Foul Odor After Cleansing: No Slough/Fibrino Yes Wound Bed Granulation Amount: Large (67-100%) Exposed Structure Granulation Quality: Red, Hyper-granulation, Friable Fascia Exposed: No Necrotic Amount: Small (1-33%) Fat Layer (Subcutaneous Tissue) Exposed: Yes Necrotic Quality: Eschar, Adherent Slough Tendon Exposed: No Muscle Exposed: No Joint Exposed: No Bone Exposed: No Treatment Notes Wound #1 (Sacrum) Cleanser Soap and Water Discharge Instruction: May shower and wash wound with dial antibacterial soap and water prior to dressing change. Wound Cleanser Discharge Instruction: Cleanse the wound with wound cleanser prior to applying a clean dressing using gauze sponges, not tissue or cotton balls. Peri-Wound Care Topical Primary Dressing Dakin's Solution 0.25%, 16 (oz) Discharge Instruction: Moisten gauze with Dakin's solution Secondary Dressing ABD Pad, 5x9 Discharge Instruction: Apply over primary dressing as directed. Woven Gauze Sponge, Non-Sterile 4x4 in Discharge Instruction: Apply over primary dressing as directed. Secured With [employer] T 2x10 (in/yd) ape Discharge Instruction: Secure with tape as directed. Compression Wrap Compression Stockings Add-Ons Electronic Signature(s) Signed: 02/06/2022 4:37:22 PM By: Karie Schwalbe RN Entered  By: Karie Schwalbe on 02/06/2022 10:40:59 -------------------------------------------------------------------------------- Wound Assessment Details Patient Name: Date of Service: [facility], [name] 02/06/2022 10:15 A M Medical Record Number: [id number] Patient Account Number: [id number] Date of Birth/Sex: Treating RN: 1960-09-07 (61 y.o. [name] Primary Care Breeley Bischof: PCP, NO Other Clinician: Referring Jacari Iannello: Treating Antoney Biven/Extender: Cleophus Molt in Treatment: 5 Wound Status Wound Number: 2 Primary Etiology: Pressure Ulcer Wound Location: Left, Lateral Ankle Wound Status: Open Wounding Event: Pressure Injury Date Acquired: 06/16/2021 Weeks Of Treatment: 5 Clustered Wound: No Photos Wound Measurements Length: (cm) 3 Width: (cm) 2 Depth: (cm) 0.2 Area: (cm) 4.712 Volume: (cm) 0.942 % Reduction in Area: -1% % Reduction in Volume: -1% Epithelialization: None Tunneling: No Undermining: No Wound Description Classification: Category/Stage III Exudate Amount: Medium Exudate Type: Serosanguineous Exudate Color: red, brown Foul Odor After Cleansing: No Slough/Fibrino Yes Wound Bed Granulation Amount: Large (67-100%) Exposed Structure Granulation Quality: Red, Hyper-granulation, Friable Fascia Exposed: No Necrotic Amount: Small (1-33%) Fat Layer (Subcutaneous Tissue) Exposed: Yes Necrotic Quality: Adherent Slough Tendon Exposed: No Muscle Exposed: No Joint Exposed: No Bone Exposed: No Treatment Notes Wound #2 (Ankle) Wound Laterality: Left, Lateral Cleanser Soap and Water Discharge Instruction: May shower and wash wound with dial antibacterial soap and water prior to dressing change. Wound Cleanser Discharge Instruction: Cleanse the wound with wound cleanser prior to applying a clean dressing using gauze sponges, not tissue or cotton balls. Peri-Wound Care Topical Primary Dressing KerraCel Ag Gelling Fiber Dressing, 2x2  in (silver alginate) Discharge Instruction: Apply silver alginate to wound bed as instructed Santyl Ointment Discharge Instruction: Apply nickel thick amount to wound bed as instructed Secondary Dressing Bordered  Gauze, 2x2 in Discharge Instruction: Apply over primary dressing as directed. Woven Gauze Sponges 2x2 in Discharge Instruction: Apply over primary dressing as directed. Secured With Compression Wrap Compression Stockings Add-Ons Electronic Signature(s) Signed: 02/06/2022 4:37:22 PM By: Dellie Catholic RN Entered By: Dellie Catholic on 02/06/2022 10:45:29 -------------------------------------------------------------------------------- Wound Assessment Details Patient Name: Date of Service: [facility], [name] 02/06/2022 10:15 A M Medical Record Number: [id number] Patient Account Number: [id number] Date of Birth/Sex: Treating RN: 09/05/1960 (61 y.o. [name] Primary Care Aalina Brege: PCP, NO Other Clinician: Referring Meli Faley: Treating Kailash Hinze/Extender: Jari Favre in Treatment: 5 Wound Status Wound Number: 3 Primary Etiology: Pressure Ulcer Wound Location: Left, Medial Ankle Wound Status: Healed - Epithelialized Wounding Event: Gradually Appeared Date Acquired: 09/09/2021 Weeks Of Treatment: 5 Clustered Wound: No Photos Wound Measurements Length: (cm) Width: (cm) Depth: (cm) Area: (cm) Volume: (cm) 0 % Reduction in Area: 100% 0 % Reduction in Volume: 100% 0 Epithelialization: None 0 Tunneling: No 0 Undermining: No Wound Description Classification: Category/Stage II Wound Margin: Distinct, outline attached Exudate Amount: None Present Foul Odor After Cleansing: No Slough/Fibrino No Wound Bed Granulation Amount: None Present (0%) Exposed Structure Necrotic Amount: None Present (0%) Fascia Exposed: No Fat Layer (Subcutaneous Tissue) Exposed: No Tendon Exposed: No Muscle Exposed: No Joint Exposed: No Bone Exposed:  No Electronic Signature(s) Signed: 02/06/2022 4:37:22 PM By: Dellie Catholic RN Entered By: Dellie Catholic on 02/06/2022 10:56:54 -------------------------------------------------------------------------------- [place] Details Patient Name: Date of Service: [facility], [name] 02/06/2022 10:15 A M Medical Record Number: [id number] Patient Account Number: [id number] Date of Birth/Sex: Treating RN: 1960/08/20 (61 y.o. [name] Primary Care Ceana Fiala: PCP, NO Other Clinician: Referring Ruie Sendejo: Treating Kasean Denherder/Extender: Jari Favre in Treatment: 5 Vital Signs Time Taken: 10:28 Temperature (F): 98.5 Height (in): 67 Pulse (bpm): 74 Weight (lbs): 130 Respiratory Rate (breaths/min): 18 Body Mass Index (BMI): 20.4 Blood Pressure (mmHg): 105/72 Reference Range: 80 - 120 mg / dl Electronic Signature(s) Signed: 02/06/2022 4:37:22 PM By: Dellie Catholic RN Entered By: Dellie Catholic on 02/06/2022 10:29:19

## 2022-02-10 NOTE — Progress Notes (Signed)
[name] [name], [name]. ([id number]) Visit Report for 02/06/2022 Chief Complaint Document Details Patient Name: Date of Service: [facility], [name] 02/06/2022 10:15 A M Medical Record Number: [id number] Patient Account Number: [id number] Date of Birth/Sex: Treating RN: [birth year] (61 y.o. [name] [name] Primary Care Provider: PCP, NO Other Clinician: Referring Provider: Treating Provider/Extender: Jari Favre in Treatment: 5 Information Obtained from: Patient Chief Complaint Patient is at the clinic for treatment of multiple open pressure ulcers Electronic Signature(s) Signed: 02/06/2022 11:04:07 AM By: Fredirick Maudlin MD FACS Entered By: Fredirick Maudlin on 02/06/2022 11:04:07 -------------------------------------------------------------------------------- Debridement Details Patient Name: Date of Service: [name] [name], [name] 02/06/2022 10:15 A M Medical Record Number: [id number] Patient Account Number: [id number] Date of Birth/Sex: Treating RN: [birth year] (61 y.o. [name] [name] Primary Care Provider: PCP, NO Other Clinician: Referring Provider: Treating Provider/Extender: Jari Favre in Treatment: 5 Debridement Performed for Assessment: Wound #2 Left,Lateral Ankle Performed By: Physician Fredirick Maudlin, MD Debridement Type: Debridement Level of Consciousness (Pre-procedure): Awake and Alert Pre-procedure Verification/Time Out Yes - 10:50 Taken: Start Time: 10:50 Pain Control: Lidocaine 5% topical ointment T Area Debrided (L x W): otal 3 (cm) x 2 (cm) = 6 (cm) Tissue and other material debrided: Viable, Non-Viable, Slough, Subcutaneous, Slough Level: Skin/Subcutaneous Tissue Debridement Description: Excisional Instrument: Curette Bleeding: Minimum Hemostasis Achieved: Pressure End Time: 10:52 Procedural Pain: 0 Post Procedural Pain: 0 Response to Treatment: Procedure was tolerated well Level of Consciousness  (Post- Awake and Alert procedure): Post Debridement Measurements of Total Wound Length: (cm) 3 Stage: Category/Stage III Width: (cm) 2 Depth: (cm) 0.2 Volume: (cm) 0.942 Character of Wound/Ulcer Post Debridement: Improved Post Procedure Diagnosis Same as Pre-procedure Electronic Signature(s) Signed: 02/06/2022 12:13:41 PM By: Fredirick Maudlin MD FACS Signed: 02/06/2022 4:37:22 PM By: Dellie Catholic RN Entered By: Dellie Catholic on 02/06/2022 10:58:52 -------------------------------------------------------------------------------- Debridement Details Patient Name: Date of Service: [name], [name] 02/06/2022 10:15 A M Medical Record Number: [id number] Patient Account Number: [id number] Date of Birth/Sex: Treating RN: 28-May-1961 (61 y.o. [name] [name] Primary Care Provider: PCP, NO Other Clinician: Referring Provider: Treating Provider/Extender: Jari Favre in Treatment: 5 Debridement Performed for Assessment: Wound #1 Sacrum Performed By: Physician Fredirick Maudlin, MD Debridement Type: Debridement Level of Consciousness (Pre-procedure): Awake and Alert Pre-procedure Verification/Time Out Yes - 10:50 Taken: Start Time: 10:50 Pain Control: Lidocaine 5% topical ointment T Area Debrided (L x W): otal 4.1 (cm) x 2.5 (cm) = 10.25 (cm) Tissue and other material debrided: Viable, Non-Viable, Slough, Subcutaneous, Slough Level: Skin/Subcutaneous Tissue Debridement Description: Excisional Instrument: Curette Bleeding: Minimum Hemostasis Achieved: Pressure End Time: 10:52 Procedural Pain: 0 Post Procedural Pain: 0 Response to Treatment: Procedure was tolerated well Level of Consciousness (Post- Awake and Alert procedure): Post Debridement Measurements of Total Wound Length: (cm) 4.1 Stage: Category/Stage IV Width: (cm) 2.5 Depth: (cm) 2.6 Volume: (cm) 20.931 Character of Wound/Ulcer Post Debridement: Improved Post Procedure  Diagnosis Same as Pre-procedure Electronic Signature(s) Signed: 02/06/2022 12:13:41 PM By: Fredirick Maudlin MD FACS Signed: 02/06/2022 4:37:22 PM By: Dellie Catholic RN Entered By: Dellie Catholic on 02/06/2022 11:32:32 -------------------------------------------------------------------------------- HPI Details Patient Name: Date of Service: [name] [name], [name] 02/06/2022 10:15 A M Medical Record Number: [id number] Patient Account Number: [id number] Date of Birth/Sex: Treating RN: 13-Jan-1961 (61 y.o. [name] [name] Primary Care Provider: PCP, NO Other Clinician: Referring Provider: Treating Provider/Extender: Jari Favre in Treatment: 5 History of Present Illness HPI Description: ADMISSION 01/02/2022 This is  a 61 year old woman with a past medical history significant for a stroke that has left her bedbound and with significant contractures. 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 I am unclear as to how long her sacral pressure ulcer has been present, but there is bone exposed and apparently she was having more drainage from the site. She has also developed a stage III pressure ulcer on her left lateral ankle and a stage II pressure ulcer on her left medial ankle. She is not diabetic, but does have a smoking history and continues to smoke about half a pack a day. ABIs were performed and were within normal limits. Antibiotics were prescribed for a total course of 6 weeks. She has been followed by infectious disease and last saw them on December 20, 2021. Infectious disease referred her to the wound care center for evaluation and management. The patient resists frequent turning because of chronic pain issues. The aide from her facility states that she is on a low-air-loss mattress. The patient says she has not been permitting dressing changes for the past few days because of her pain. The dressings  that were removed from her sacral wound were putrid. She does state that she drinks 2-3 protein shakes such as boost or Ensure daily and she is also receiving Pro-Stat at her facility. 01/16/2022: She has not received a level 3 surface and is still on a level 2. She says she is drinking protein shakes; she has a Boost with her today but it only has 10 g of protein. The left medial ankle wound has a little bit of eschar and just a tiny open area. The left lateral ankle wound has hypertrophic granulation tissue. The sacral wound is perhaps a little bit smaller but still has bone frankly exposed. 02/06/2022: We are still working on getting her a level 3 mattress surface. Her pain has decreased and she is tolerating her dressing changes much better. The left medial ankle wound is healed. The left latera ankle wound has a lot of accumulated slough. The sacrum is a little smaller and the tunneling is not as deep. Bone is still exposed. Minimal slough accumulation. Electronic Signature(s) Signed: 02/06/2022 11:07:58 AM By: Fredirick Maudlin MD FACS Previous Signature: 02/06/2022 11:05:08 AM Version By: Fredirick Maudlin MD FACS Entered By: Fredirick Maudlin on 02/06/2022 11:07:57 -------------------------------------------------------------------------------- Physical Exam Details Patient Name: Date of Service: [name] [name], [name] 02/06/2022 10:15 A M Medical Record Number: [id number] Patient Account Number: [id number] Date of Birth/Sex: Treating RN: 07-27-1960 (61 y.o. [name] [name] Primary Care Provider: PCP, NO Other Clinician: Referring Provider: Treating Provider/Extender: Jari Favre in Treatment: 5 Constitutional . . . . No acute distress.Marland Kitchen Respiratory Normal work of breathing on room air.. Notes 02/06/2022: Her pain has decreased and she is tolerating her dressing changes much better. The left medial ankle wound is healed. The left lateral ankle wound has a lot of  accumulated slough. The sacrum is a little smaller and the tunneling is not as deep. Bone is still exposed. Minimal slough accumulation. Electronic Signature(s) Signed: 02/06/2022 11:08:17 AM By: Fredirick Maudlin MD FACS Previous Signature: 02/06/2022 11:05:51 AM Version By: Fredirick Maudlin MD FACS Entered By: Fredirick Maudlin on 02/06/2022 11:08:17 -------------------------------------------------------------------------------- Physician Orders Details Patient Name: Date of Service: [facility], [name] 02/06/2022 10:15 A M Medical Record Number: [id number] Patient Account Number: [id number] Date of Birth/Sex: Treating RN: 1960-09-03 (61 y.o.  [name] [name] Primary Care Provider: PCP, NO Other Clinician: Referring Provider: Treating Provider/Extender: Jari Favre in Treatment: 5 Verbal / Phone Orders: No Diagnosis Coding ICD-10 Coding Code Description L89.154 Pressure ulcer of sacral region, stage 4 L89.522 Pressure ulcer of left ankle, stage 2 L89.523 Pressure ulcer of left ankle, stage 3 M46.28 Osteomyelitis of vertebra, sacral and sacrococcygeal region Z86.73 Personal history of transient ischemic attack (TIA), and cerebral infarction without residual deficits E44.0 Moderate protein-calorie malnutrition Follow-up Appointments Return appointment in 3 weeks. - *** HOYER/Stretcher**** Dr Celine Ahr Room 3 Tuesday September 5th at 11am Anesthetic Wound #1 Sacrum (In clinic) Topical Lidocaine 4% applied to wound bed - In clinic Wound #2 Left,Lateral Ankle (In clinic) Topical Lidocaine 4% applied to wound bed - In clinic Off-Loading A fluidized (Group 3) mattress - Level 3 mattress ir Other: - Continue wearing Bunny boots or something similar like prevalon boots and offload pressure points Additional Orders / Instructions Follow Nutritious Diet - Keep doing Protein shakes and Prostat Wound Treatment Wound #1 - Sacrum Cleanser: Soap and Water 1 x Per  Day/30 Days Discharge Instructions: May shower and wash wound with dial antibacterial soap and water prior to dressing change. Cleanser: Wound Cleanser 1 x Per Day/30 Days Discharge Instructions: Cleanse the wound with wound cleanser prior to applying a clean dressing using gauze sponges, not tissue or cotton balls. Prim Dressing: Dakin's Solution 0.25%, 16 (oz) 1 x Per Day/30 Days ary Discharge Instructions: Moisten gauze with Dakin's solution Secondary Dressing: ABD Pad, 5x9 1 x Per Day/30 Days Discharge Instructions: Apply over primary dressing as directed. Secondary Dressing: Woven Gauze Sponge, Non-Sterile 4x4 in 1 x Per Day/30 Days Discharge Instructions: Apply over primary dressing as directed. Secured With: 42M Medipore Public affairs consultant Surgical T 2x10 (in/yd) 1 x Per Day/30 Days ape Discharge Instructions: Secure with tape as directed. Wound #2 - Ankle Wound Laterality: Left, Lateral Cleanser: Soap and Water 1 x Per Day/30 Days Discharge Instructions: May shower and wash wound with dial antibacterial soap and water prior to dressing change. Cleanser: Wound Cleanser 1 x Per Day/30 Days Discharge Instructions: Cleanse the wound with wound cleanser prior to applying a clean dressing using gauze sponges, not tissue or cotton balls. Prim Dressing: KerraCel Ag Gelling Fiber Dressing, 2x2 in (silver alginate) 1 x Per Day/30 Days ary Discharge Instructions: Apply silver alginate to wound bed as instructed Prim Dressing: Santyl Ointment 1 x Per Day/30 Days ary Discharge Instructions: Apply nickel thick amount to wound bed as instructed Secondary Dressing: Bordered Gauze, 2x2 in 1 x Per Day/30 Days Discharge Instructions: Apply over primary dressing as directed. Secondary Dressing: Woven Gauze Sponges 2x2 in 1 x Per Day/30 Days Discharge Instructions: Apply over primary dressing as directed. Electronic Signature(s) Signed: 02/06/2022 12:13:41 PM By: Fredirick Maudlin MD FACS Signed: 02/06/2022  4:37:22 PM By: Dellie Catholic RN Entered By: Dellie Catholic on 02/06/2022 11:41:48 -------------------------------------------------------------------------------- Problem List Details Patient Name: Date of Service: [facility], [name] 02/06/2022 10:15 A M Medical Record Number: [id number] Patient Account Number: [id number] Date of Birth/Sex: Treating RN: [birth year] (61 y.o. [name] [name] Primary Care Provider: PCP, NO Other Clinician: Referring Provider: Treating Provider/Extender: Jari Favre in Treatment: 5 Active Problems ICD-10 Encounter Code Description Active Date MDM Diagnosis L89.154 Pressure ulcer of sacral region, stage 4 01/02/2022 No Yes L89.522 Pressure ulcer of left ankle, stage 2 01/02/2022 No Yes L89.523 Pressure ulcer of left ankle, stage 3 01/02/2022 No Yes M46.28 Osteomyelitis of  vertebra, sacral and sacrococcygeal region 01/02/2022 No Yes Z86.73 Personal history of transient ischemic attack (TIA), and cerebral infarction 01/02/2022 No Yes without residual deficits E44.0 Moderate protein-calorie malnutrition 01/02/2022 No Yes Inactive Problems Resolved Problems Electronic Signature(s) Signed: 02/06/2022 11:02:39 AM By: Fredirick Maudlin MD FACS Entered By: Fredirick Maudlin on 02/06/2022 11:02:38 -------------------------------------------------------------------------------- Progress Note Details Patient Name: Date of Service: [name] [name], [name] 02/06/2022 10:15 A M Medical Record Number: [id number] Patient Account Number: [id number] Date of Birth/Sex: Treating RN: [birth year] (61 y.o. [name] [name] Primary Care Provider: PCP, NO Other Clinician: Referring Provider: Treating Provider/Extender: Jari Favre in Treatment: 5 Subjective Chief Complaint Information obtained from Patient Patient is at the clinic for treatment of multiple open pressure ulcers History of Present Illness  (HPI) ADMISSION 01/02/2022 This is a 61 year old woman with a past medical history significant for a stroke that has left her bedbound and with significant contractures. 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 I am unclear as to how long her sacral pressure ulcer has been present, but there is bone exposed and apparently she was having more drainage from the site. She has also developed a stage III pressure ulcer on her left lateral ankle and a stage II pressure ulcer on her left medial ankle. She is not diabetic, but does have a smoking history and continues to smoke about half a pack a day. ABIs were performed and were within normal limits. Antibiotics were prescribed for a total course of 6 weeks. She has been followed by infectious disease and last saw them on December 20, 2021. Infectious disease referred her to the wound care center for evaluation and management. The patient resists frequent turning because of chronic pain issues. The aide from her facility states that she is on a low-air-loss mattress. The patient says she has not been permitting dressing changes for the past few days because of her pain. The dressings that were removed from her sacral wound were putrid. She does state that she drinks 2-3 protein shakes such as boost or Ensure daily and she is also receiving Pro-Stat at her facility. 01/16/2022: She has not received a level 3 surface and is still on a level 2. She says she is drinking protein shakes; she has a Boost with her today but it only has 10 g of protein. The left medial ankle wound has a little bit of eschar and just a tiny open area. The left lateral ankle wound has hypertrophic granulation tissue. The sacral wound is perhaps a little bit smaller but still has bone frankly exposed. 02/06/2022: We are still working on getting her a level 3 mattress surface. Her pain has decreased and she is tolerating her  dressing changes much better. The left medial ankle wound is healed. The left latera ankle wound has a lot of accumulated slough. The sacrum is a little smaller and the tunneling is not as deep. Bone is still exposed. Minimal slough accumulation. Patient History Information obtained from Patient. Family History Unknown History. Social History Current some day smoker, Marital Status - Single, Alcohol Use - Never, Drug Use - No History, Caffeine Use - Rarely. Medical A Surgical History Notes nd Cardiovascular CVA 2001 Psychiatric Bipolar disorder Objective Constitutional No acute distress.. Vitals Time Taken: 10:28 AM, Height: 67 in, Weight: 130 lbs, BMI: 20.4, Temperature: 98.5 F, Pulse: 74 bpm, Respiratory Rate: 18 breaths/min, Blood Pressure: 105/72 mmHg. Respiratory Normal  work of breathing on room air.. General Notes: 02/06/2022: Her pain has decreased and she is tolerating her dressing changes much better. The left medial ankle wound is healed. The left lateral ankle wound has a lot of accumulated slough. The sacrum is a little smaller and the tunneling is not as deep. Bone is still exposed. Minimal slough accumulation. Integumentary (Hair, Skin) Wound #1 status is Open. Original cause of wound was Pressure Injury. The date acquired was: 06/16/2021. The wound has been in treatment 5 weeks. The wound is located on the Sacrum. The wound measures 4.1cm length x 2.5cm width x 2.6cm depth; 8.05cm^2 area and 20.931cm^3 volume. There is Fat Layer (Subcutaneous Tissue) exposed. There is undermining starting at :00 and ending at 12:00 with a maximum distance of 1.5cm. There is a medium amount of serosanguineous drainage noted. There is large (67-100%) red, friable, hyper - granulation within the wound bed. There is a small (1-33%) amount of necrotic tissue within the wound bed including Eschar and Adherent Slough. Wound #2 status is Open. Original cause of wound was Pressure Injury. The  date acquired was: 06/16/2021. The wound has been in treatment 5 weeks. The wound is located on the Left,Lateral Ankle. The wound measures 3cm length x 2cm width x 0.2cm depth; 4.712cm^2 area and 0.942cm^3 volume. There is Fat Layer (Subcutaneous Tissue) exposed. There is no tunneling or undermining noted. There is a medium amount of serosanguineous drainage noted. There is large (67-100%) red, friable, hyper - granulation within the wound bed. There is a small (1-33%) amount of necrotic tissue within the wound bed including Adherent Slough. Wound #3 status is Healed - Epithelialized. Original cause of wound was Gradually Appeared. The date acquired was: 09/09/2021. The wound has been in treatment 5 weeks. The wound is located on the Left,Medial Ankle. The wound measures 0cm length x 0cm width x 0cm depth; 0cm^2 area and 0cm^3 volume. There is no tunneling or undermining noted. There is a none present amount of drainage noted. The wound margin is distinct with the outline attached to the wound base. There is no granulation within the wound bed. There is no necrotic tissue within the wound bed. 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 There was a Excisional Skin/Subcutaneous Tissue Debridement with a total area of 10.25 sq cm performed by Fredirick Maudlin, MD. With the following instrument(s): Curette to remove Viable and Non-Viable tissue/material. Material removed includes Subcutaneous Tissue and Slough and after achieving pain control using Lidocaine 5% topical ointment. No specimens were  taken. A time out was conducted at 10:50, prior to the start of the procedure. A Minimum amount of bleeding was controlled with Pressure. The procedure was tolerated well with a pain level of 0 throughout and a pain level of 0 following the procedure. Post Debridement Measurements: 4.1cm length x 2.5cm width x 2.6cm depth; 20.931cm^3 volume. Post debridement Stage noted as Category/Stage IV. Character of Wound/Ulcer Post Debridement is improved. Post procedure Diagnosis Wound #1: Same as Pre-Procedure Wound #2 Pre-procedure diagnosis of Wound #2 is a Pressure Ulcer located on the Left,Lateral Ankle . There was a Excisional Skin/Subcutaneous Tissue Debridement with a total area of 6  sq cm performed by Duanne Guess, MD. With the following instrument(s): Curette to remove Viable and Non-Viable tissue/material. Material removed includes Subcutaneous Tissue and Slough and after achieving pain control using Lidocaine 5% topical ointment. No specimens were taken. A time out was conducted at 10:50, prior to the start of the procedure. A Minimum amount of bleeding was controlled with Pressure. The procedure was tolerated well with a pain level of 0 throughout and a pain level of 0 following the procedure. Post Debridement Measurements: 3cm length x 2cm width x 0.2cm depth; 0.942cm^3 volume. Post debridement Stage noted as Category/Stage III. Character of Wound/Ulcer Post Debridement is improved. Post procedure Diagnosis Wound #2: Same as Pre-Procedure Plan Follow-up Appointments: Return appointment in 3 weeks. - *** HOYER/Stretcher**** Dr Lady Gary Room 3 Monday September 4th at 11am Off-Loading: Air fluidized (Group 3) mattress - Level 3 mattress Other: - Continue wearing Bunny boots or something similar like prevalon boots and offload pressure points Additional Orders / Instructions: Follow Nutritious Diet - Keep doing Protein shakes and Prostat WOUND #1: - Sacrum Wound Laterality: Cleanser: Soap  and Water 1 x Per Day/30 Days Discharge Instructions: May shower and wash wound with dial antibacterial soap and water prior to dressing change. Cleanser: Wound Cleanser 1 x Per Day/30 Days Discharge Instructions: Cleanse the wound with wound cleanser prior to applying a clean dressing using gauze sponges, not tissue or cotton balls. Prim Dressing: Dakin's Solution 0.25%, 16 (oz) 1 x Per Day/30 Days ary Discharge Instructions: Moisten gauze with Dakin's solution Secondary Dressing: ABD Pad, 5x9 1 x Per Day/30 Days Discharge Instructions: Apply over primary dressing as directed. Secondary Dressing: Woven Gauze Sponge, Non-Sterile 4x4 in 1 x Per Day/30 Days Discharge Instructions: Apply over primary dressing as directed. Secured With: 31M Medipore Scientist, research (life sciences) Surgical T 2x10 (in/yd) 1 x Per Day/30 Days ape Discharge Instructions: Secure with tape as directed. WOUND #2: - Ankle Wound Laterality: Left, Lateral Cleanser: Soap and Water 1 x Per Day/30 Days Discharge Instructions: May shower and wash wound with dial antibacterial soap and water prior to dressing change. Cleanser: Wound Cleanser 1 x Per Day/30 Days Discharge Instructions: Cleanse the wound with wound cleanser prior to applying a clean dressing using gauze sponges, not tissue or cotton balls. Prim Dressing: KerraCel Ag Gelling Fiber Dressing, 2x2 in (silver alginate) 1 x Per Day/30 Days ary Discharge Instructions: Apply silver alginate to wound bed as instructed Prim Dressing: Santyl Ointment 1 x Per Day/30 Days ary Discharge Instructions: Apply nickel thick amount to wound bed as instructed Secondary Dressing: Bordered Gauze, 2x2 in 1 x Per Day/30 Days Discharge Instructions: Apply over primary dressing as directed. Secondary Dressing: Woven Gauze Sponges 2x2 in 1 x Per Day/30 Days Discharge Instructions: Apply over primary dressing as directed. 02/06/2022: Her pain has decreased and she is tolerating her dressing changes much  better. The left medial ankle wound is healed. The left lateral ankle wound has a lot of accumulated slough. The sacrum is a little smaller and the tunneling is not as deep. Bone is still exposed. Minimal slough accumulation. I used a curette to debride slough off of the the left lateral ankle wound. I debrided slough from the sacral wound. We will continue daily dressing changes with Santyl and silver alginate to the ankle and Dakin's moistened gauze to the sacrum. We will continue to work towards getting her a more appropriate sleeping surface. She needs to wear Prevalon boot on her left leg at all times. Continue aggressive offloading of  the sacrum. Continue to augment oral protein intake. Follow-up in 3 weeks. Electronic Signature(s) Signed: 02/06/2022 11:08:42 AM By: Duanne Guess MD FACS Previous Signature: 02/06/2022 11:07:29 AM Version By: Duanne Guess MD FACS Entered By: Duanne Guess on 02/06/2022 11:08:42 -------------------------------------------------------------------------------- HxROS Details Patient Name: Date of Service: [name] [name], [name] 02/06/2022 10:15 A M Medical Record Number: [id number] Patient Account Number: [id number] Date of Birth/Sex: Treating RN: [birth year] (61 y.o. [name] Primary Care Provider: PCP, NO Other Clinician: Referring Provider: Treating Provider/Extender: Cleophus Molt in Treatment: 5 Information Obtained From Patient Cardiovascular Medical History: Past Medical History Notes: CVA 2001 Psychiatric Medical History: Past Medical History Notes: Bipolar disorder Immunizations Pneumococcal Vaccine: Received Pneumococcal Vaccination: No Implantable Devices No devices added Family and Social History Unknown History: Yes; Current some day smoker; Marital Status - Single; Alcohol Use: Never; Drug Use: No History; Caffeine Use: Rarely; Financial Concerns: No; Food, Clothing or Shelter Needs: No;  Support System Lacking: No; Transportation Concerns: No Electronic Signature(s) Signed: 02/06/2022 12:13:41 PM By: Duanne Guess MD FACS Signed: 02/06/2022 4:37:22 PM By: Karie Schwalbe RN Entered By: Duanne Guess on 02/06/2022 11:05:13 -------------------------------------------------------------------------------- SuperBill Details Patient Name: Date of Service: [facility] [name] 02/06/2022 Medical Record Number: [id number] Patient Account Number: [id number] Date of Birth/Sex: Treating RN: [birth year] (61 y.o. 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 residual deficits E44.0 Moderate protein-calorie malnutrition Facility Procedures CPT4 Code: [id number] Description: 11042 - DEB SUBQ TISSUE 20 SQ CM/< ICD-10 Diagnosis Description L89.154 Pressure ulcer of sacral region, stage 4 L89.523 Pressure ulcer of left ankle, stage 3 Modifier: Quantity: 1 Physician Procedures : CPT4 Code Description Modifier [id number] 99214 - WC PHYS LEVEL 4 - EST PT 25 ICD-10 Diagnosis Description L89.154 Pressure ulcer of sacral region, stage 4 L89.522 Pressure ulcer of left ankle, stage 2 L89.523 Pressure ulcer of left ankle, stage 3 M46.28  Osteomyelitis of vertebra, sacral and sacrococcygeal region Quantity: 1 : [id number] 11042 - WC PHYS SUBQ TISS 20 SQ CM ICD-10 Diagnosis Description L89.154 Pressure ulcer of sacral region, stage 4 L89.523 Pressure ulcer of left ankle, stage 3 Quantity: 1 Electronic Signature(s) Signed: 02/06/2022  11:09:17 AM By: Duanne Guess MD FACS Entered By: Duanne Guess on 02/06/2022 11:09:16

## 2022-02-28 ENCOUNTER — Encounter (HOSPITAL_BASED_OUTPATIENT_CLINIC_OR_DEPARTMENT_OTHER): Payer: Medicaid Other | Admitting: General Surgery

## 2022-06-03 ENCOUNTER — Other Ambulatory Visit: Payer: Self-pay

## 2022-06-03 ENCOUNTER — Emergency Department (HOSPITAL_COMMUNITY): Payer: Medicaid Other

## 2022-06-03 ENCOUNTER — Inpatient Hospital Stay (HOSPITAL_COMMUNITY)
Admission: EM | Admit: 2022-06-03 | Discharge: 2022-06-06 | DRG: 602 | Disposition: A | Discharge status: Skilled Nursing Facility | Payer: Medicaid Other | Source: Skilled Nursing Facility | Attending: Family Medicine | Admitting: Family Medicine

## 2022-06-03 ENCOUNTER — Encounter (HOSPITAL_COMMUNITY): Payer: Self-pay

## 2022-06-03 DIAGNOSIS — Z7982 Long term (current) use of aspirin: Secondary | ICD-10-CM

## 2022-06-03 DIAGNOSIS — Z8673 Personal history of transient ischemic attack (TIA), and cerebral infarction without residual deficits: Secondary | ICD-10-CM

## 2022-06-03 DIAGNOSIS — Z7401 Bed confinement status: Secondary | ICD-10-CM

## 2022-06-03 DIAGNOSIS — Z66 Do not resuscitate: Secondary | ICD-10-CM | POA: Diagnosis present

## 2022-06-03 DIAGNOSIS — Z79899 Other long term (current) drug therapy: Secondary | ICD-10-CM | POA: Diagnosis not present

## 2022-06-03 DIAGNOSIS — F1290 Cannabis use, unspecified, uncomplicated: Secondary | ICD-10-CM | POA: Diagnosis present

## 2022-06-03 DIAGNOSIS — Z8659 Personal history of other mental and behavioral disorders: Secondary | ICD-10-CM | POA: Diagnosis not present

## 2022-06-03 DIAGNOSIS — L03314 Cellulitis of groin: Principal | ICD-10-CM | POA: Diagnosis present

## 2022-06-03 DIAGNOSIS — Z823 Family history of stroke: Secondary | ICD-10-CM | POA: Diagnosis not present

## 2022-06-03 DIAGNOSIS — D72829 Elevated white blood cell count, unspecified: Secondary | ICD-10-CM | POA: Diagnosis present

## 2022-06-03 DIAGNOSIS — F17290 Nicotine dependence, other tobacco product, uncomplicated: Secondary | ICD-10-CM | POA: Diagnosis present

## 2022-06-03 DIAGNOSIS — M810 Age-related osteoporosis without current pathological fracture: Secondary | ICD-10-CM | POA: Diagnosis present

## 2022-06-03 DIAGNOSIS — I69351 Hemiplegia and hemiparesis following cerebral infarction affecting right dominant side: Secondary | ICD-10-CM | POA: Diagnosis not present

## 2022-06-03 DIAGNOSIS — F319 Bipolar disorder, unspecified: Secondary | ICD-10-CM | POA: Diagnosis present

## 2022-06-03 DIAGNOSIS — F17210 Nicotine dependence, cigarettes, uncomplicated: Secondary | ICD-10-CM | POA: Diagnosis present

## 2022-06-03 DIAGNOSIS — Z881 Allergy status to other antibiotic agents status: Secondary | ICD-10-CM

## 2022-06-03 DIAGNOSIS — Z96643 Presence of artificial hip joint, bilateral: Secondary | ICD-10-CM | POA: Diagnosis present

## 2022-06-03 DIAGNOSIS — N200 Calculus of kidney: Secondary | ICD-10-CM | POA: Diagnosis present

## 2022-06-03 DIAGNOSIS — Z888 Allergy status to other drugs, medicaments and biological substances status: Secondary | ICD-10-CM

## 2022-06-03 DIAGNOSIS — E559 Vitamin D deficiency, unspecified: Secondary | ICD-10-CM | POA: Diagnosis present

## 2022-06-03 DIAGNOSIS — Z86711 Personal history of pulmonary embolism: Secondary | ICD-10-CM

## 2022-06-03 DIAGNOSIS — G8929 Other chronic pain: Secondary | ICD-10-CM | POA: Diagnosis present

## 2022-06-03 DIAGNOSIS — M6248 Contracture of muscle, other site: Secondary | ICD-10-CM | POA: Diagnosis present

## 2022-06-03 DIAGNOSIS — L0390 Cellulitis, unspecified: Secondary | ICD-10-CM | POA: Diagnosis present

## 2022-06-03 DIAGNOSIS — L03311 Cellulitis of abdominal wall: Secondary | ICD-10-CM | POA: Diagnosis present

## 2022-06-03 DIAGNOSIS — F32A Depression, unspecified: Secondary | ICD-10-CM | POA: Diagnosis present

## 2022-06-03 DIAGNOSIS — M4628 Osteomyelitis of vertebra, sacral and sacrococcygeal region: Secondary | ICD-10-CM | POA: Diagnosis present

## 2022-06-03 DIAGNOSIS — Z8249 Family history of ischemic heart disease and other diseases of the circulatory system: Secondary | ICD-10-CM

## 2022-06-03 DIAGNOSIS — G9341 Metabolic encephalopathy: Secondary | ICD-10-CM | POA: Diagnosis present

## 2022-06-03 DIAGNOSIS — L89154 Pressure ulcer of sacral region, stage 4: Secondary | ICD-10-CM | POA: Diagnosis present

## 2022-06-03 DIAGNOSIS — R52 Pain, unspecified: Secondary | ICD-10-CM | POA: Diagnosis present

## 2022-06-03 LAB — SEDIMENTATION RATE: Sed Rate: 12 mm/hr (ref 0–22)

## 2022-06-03 LAB — CBC WITH DIFFERENTIAL/PLATELET
Abs Immature Granulocytes: 0.03 10*3/uL (ref 0.00–0.07)
Basophils Absolute: 0 10*3/uL (ref 0.0–0.1)
Basophils Relative: 0 %
Eosinophils Absolute: 0.3 10*3/uL (ref 0.0–0.5)
Eosinophils Relative: 3 %
HCT: 42.1 % (ref 36.0–46.0)
Hemoglobin: 13.1 g/dL (ref 12.0–15.0)
Immature Granulocytes: 0 %
Lymphocytes Relative: 17 %
Lymphs Abs: 1.9 10*3/uL (ref 0.7–4.0)
MCH: 28.7 pg (ref 26.0–34.0)
MCHC: 31.1 g/dL (ref 30.0–36.0)
MCV: 92.1 fL (ref 80.0–100.0)
Monocytes Absolute: 0.5 10*3/uL (ref 0.1–1.0)
Monocytes Relative: 5 %
Neutro Abs: 8.1 10*3/uL — ABNORMAL HIGH (ref 1.7–7.7)
Neutrophils Relative %: 75 %
Platelets: 268 10*3/uL (ref 150–400)
RBC: 4.57 MIL/uL (ref 3.87–5.11)
RDW: 16.1 % — ABNORMAL HIGH (ref 11.5–15.5)
WBC: 10.8 10*3/uL — ABNORMAL HIGH (ref 4.0–10.5)
nRBC: 0 % (ref 0.0–0.2)

## 2022-06-03 LAB — BASIC METABOLIC PANEL
Anion gap: 8 (ref 5–15)
BUN: 27 mg/dL — ABNORMAL HIGH (ref 6–20)
CO2: 28 mmol/L (ref 22–32)
Calcium: 8.9 mg/dL (ref 8.9–10.3)
Chloride: 106 mmol/L (ref 98–111)
Creatinine, Ser: 0.4 mg/dL — ABNORMAL LOW (ref 0.44–1.00)
GFR, Estimated: >60 mL/min (ref >60)
Glucose, Bld: 91 mg/dL (ref 70–99)
Potassium: 3.9 mmol/L (ref 3.5–5.1)
Sodium: 142 mmol/L (ref 135–145)

## 2022-06-03 LAB — LACTIC ACID, PLASMA
Lactic Acid, Venous: 1.9 mmol/L (ref 0.5–1.9)
Lactic Acid, Venous: 1 mmol/L (ref 0.5–1.9)

## 2022-06-03 LAB — C-REACTIVE PROTEIN: CRP: 1.1 mg/dL — ABNORMAL HIGH (ref <1.0)

## 2022-06-03 MED ORDER — KETOROLAC TROMETHAMINE 15 MG/ML IJ SOLN
15.0000 mg | Freq: Once | INTRAMUSCULAR | Status: AC
  Administered 2022-06-03: 15 mg via INTRAVENOUS
  Filled 2022-06-03: qty 1

## 2022-06-03 MED ORDER — SODIUM CHLORIDE 0.9% FLUSH
3.0000 mL | Freq: Two times a day (BID) | INTRAVENOUS | Status: DC
  Administered 2022-06-03 – 2022-06-06 (×5): 3 mL via INTRAVENOUS

## 2022-06-03 MED ORDER — ACETAMINOPHEN 325 MG PO TABS
650.0000 mg | ORAL_TABLET | Freq: Four times a day (QID) | ORAL | Status: DC | PRN
  Administered 2022-06-03: 650 mg via ORAL
  Filled 2022-06-03: qty 2

## 2022-06-03 MED ORDER — SODIUM CHLORIDE 0.9 % IV SOLN
2.0000 g | Freq: Once | INTRAVENOUS | Status: AC
  Administered 2022-06-03: 2 g via INTRAVENOUS
  Filled 2022-06-03: qty 20

## 2022-06-03 MED ORDER — VANCOMYCIN HCL IN DEXTROSE 1-5 GM/200ML-% IV SOLN
1000.0000 mg | Freq: Once | INTRAVENOUS | Status: AC
  Administered 2022-06-03: 1000 mg via INTRAVENOUS
  Filled 2022-06-03: qty 200

## 2022-06-03 MED ORDER — ENOXAPARIN SODIUM 40 MG/0.4ML IJ SOSY
40.0000 mg | PREFILLED_SYRINGE | INTRAMUSCULAR | Status: DC
  Administered 2022-06-03 – 2022-06-05 (×3): 40 mg via SUBCUTANEOUS
  Filled 2022-06-03 (×3): qty 0.4

## 2022-06-03 MED ORDER — ALBUTEROL SULFATE (2.5 MG/3ML) 0.083% IN NEBU: 2.5000 mg | INHALATION_SOLUTION | Freq: Four times a day (QID) | RESPIRATORY_TRACT | Status: DC | PRN

## 2022-06-03 MED ORDER — GABAPENTIN 600 MG PO TABS
600.0000 mg | ORAL_TABLET | Freq: Three times a day (TID) | ORAL | Status: DC
  Administered 2022-06-03 – 2022-06-06 (×7): 600 mg via ORAL
  Filled 2022-06-03 (×8): qty 1

## 2022-06-03 MED ORDER — LIDOCAINE 5 % EX OINT
TOPICAL_OINTMENT | Freq: Three times a day (TID) | CUTANEOUS | Status: DC | PRN
  Filled 2022-06-03: qty 35.44

## 2022-06-03 MED ORDER — IOHEXOL 350 MG/ML SOLN
75.0000 mL | Freq: Once | INTRAVENOUS | Status: AC | PRN
  Administered 2022-06-03: 75 mL via INTRAVENOUS

## 2022-06-03 MED ORDER — VANCOMYCIN HCL 500 MG/100ML IV SOLN
500.0000 mg | Freq: Two times a day (BID) | INTRAVENOUS | Status: DC
  Administered 2022-06-04 – 2022-06-06 (×5): 500 mg via INTRAVENOUS
  Filled 2022-06-03 (×6): qty 100

## 2022-06-03 MED ORDER — CEFTRIAXONE SODIUM 2 G IJ SOLR
2.0000 g | INTRAMUSCULAR | Status: DC
  Administered 2022-06-04 – 2022-06-05 (×2): 2 g via INTRAVENOUS
  Filled 2022-06-03 (×2): qty 20

## 2022-06-03 MED ORDER — GABAPENTIN 600 MG PO TABS: 600.0000 mg | ORAL_TABLET | Freq: Three times a day (TID) | ORAL | Status: DC

## 2022-06-03 MED ORDER — ACETAMINOPHEN 650 MG RE SUPP: 650.0000 mg | Freq: Four times a day (QID) | RECTAL | Status: DC | PRN

## 2022-06-03 NOTE — ED Triage Notes (Signed)
To ED via [facility] from nursing facility on [address] ( recently changed names)  Pt is alert/oriented x 4, has had a stroke in the past-- contracted extremities on right. Per nursing home-- wounds under panus in groin area nad sacral area-- these have been present for months per pt.

## 2022-06-03 NOTE — Progress Notes (Signed)
Pharmacy Antibiotic Note  [name] [name] is a 61 y.o. female admitted on 06/03/2022 with cellulitis.  Pharmacy has been consulted for Vancomycin dosing.  CrCl 60-70 mL/min  Plan: Vancomycin 1000mg  IV once then 500mg  Q12H (eAUC 404, Scr 0.8, Vd 0.72) Ceftriaxone 2g Q24H  Height: 5\' 5"  (165.1 cm) Weight: 56.7 kg (125 lb) IBW/kg (Calculated) : 57  Temp (24hrs), Avg:98.5 F (36.9 C), Min:98.5 F (36.9 C), Max:98.5 F (36.9 C)  Recent Labs  Lab 06/03/22 1355  WBC 10.8*  CREATININE 0.40*  LATICACIDVEN 1.9    Estimated Creatinine Clearance: 66.9 mL/min (A) (by C-G formula based on SCr of 0.4 mg/dL (L)).    Allergies  Allergen Reactions   Ativan [Lorazepam]    Bactrim [Sulfamethoxazole-Trimethoprim]     Not On MAR   Paxil [Paroxetine Hcl]     Not on MAR    Antimicrobials this admission: Vancomycin 12/9 >> Ceftriaxone 12/9 >>  Microbiology results: Pending  Thank you for allowing pharmacy to be a part of this patient's care.  14/09/23, PharmD Clinical Pharmacist 06/03/2022 5:57 PM

## 2022-06-03 NOTE — ED Provider Notes (Signed)
[name] EMERGENCY DEPARTMENT Provider Note   CSN: [id number] Arrival date & time: 06/03/22  1227     History  Chief Complaint  Patient presents with   Wound Check    [name] [name] is a 61 y.o. female with a past medical history of stroke, who presents emergency department brought in by EMS from nursing facility on [address] with concerns for wound infection.  She notes that she was evaluated by the wound nurse today at the nursing facility who recommended she come to the emergency department for follow-up of her symptoms.  Patient notes that the area has been draining blood and is painful and is localized to the groin region.  Also notes that she has a wound to her bottom which has been present for several months.  No measured prior to arrival. Denies chest pain, shortness of breath, abdominal pain, nausea, vomiting, fever.  The history is provided by the patient. No language interpreter was used.       Home Medications Prior to Admission medications   Medication Sig Start Date End Date Taking? Authorizing Provider  allopurinol (ZYLOPRIM) 100 MG tablet Take 100 mg by mouth daily.    [provider]  Amino Acids-Protein Hydrolys (PRO-STAT PO) Take 30 mLs by mouth 2 (two) times daily.    [provider]  aspirin 81 MG tablet Take 81 mg by mouth daily.    [provider]  calcium-vitamin D (OSCAL WITH D) 500-5 MG-MCG tablet Take 1 tablet by mouth 2 (two) times daily.    [provider]  cholecalciferol (VITAMIN D) 25 MCG (1000 UNIT) tablet Take 1,000 Units by mouth daily.    [provider]  cycloSPORINE (RESTASIS) 0.05 % ophthalmic emulsion Place 1 drop into both eyes 2 (two) times daily.     [provider]  diclofenac Sodium (VOLTAREN) 1 % GEL Apply 2 g topically every 6 (six) hours as needed (for pain).    [provider]  FLUoxetine (PROZAC) 40 MG capsule Take 40 mg by mouth daily.     [provider]  gabapentin (NEURONTIN) 600 MG tablet Take 600 mg by mouth every 8 (eight) hours.    [provider]  Hypromellose 0.4 % SOLN Place 2 drops into both eyes every 4 (four) hours as needed (dry eyes). Patient not taking: Reported on 11/09/2021    [provider]  ibuprofen (ADVIL) 400 MG tablet Take 800 mg by mouth every 6 (six) hours as needed (for pain).    [provider]  LACTOBACILLUS PO Take 1 capsule by mouth 2 (two) times daily.    [provider]  loperamide (IMODIUM) 2 MG capsule Take 2 mg by mouth every 6 (six) hours as needed for diarrhea or loose stools.    [provider]  magnesium oxide (MAG-OX) 400 MG tablet Take 400 mg by mouth daily.    [provider]  melatonin 3 MG TABS tablet Take 3 mg by mouth at bedtime as needed (for insomnia).    [provider]  mirtazapine (REMERON) 7.5 MG tablet Take 7.5 mg by mouth at bedtime.    [provider]  sennosides-docusate sodium (SENOKOT-S) 8.6-50 MG tablet Take 2 tablets by mouth daily as needed for constipation.    [provider]  tiZANidine (ZANAFLEX) 2 MG tablet Take 2 mg by mouth at bedtime. Then give with 4mg  tablet at bedtime for a total dose of 6mg  at midnight    [provider]  tiZANidine (ZANAFLEX) 4 MG tablet Take 4 mg by mouth 2 (two) times daily. Also give 4mg  by mouth along with 2mg  for a total dose of 6mg  at midnight    [provider]  vitamin C (ASCORBIC ACID) 500 MG tablet Take 500 mg by mouth daily.    [provider]  Zinc Oxide 10 % OINT Apply 1 application. topically in the morning and at bedtime. Apply to face topically every day and evening    [provider]      Allergies    Ativan [lorazepam], Bactrim [sulfamethoxazole-trimethoprim], and Paxil [paroxetine hcl]    Review of Systems   Review of Systems  All other systems reviewed and are negative.   Physical Exam Updated  Vital Signs BP 114/63   Pulse 66   Temp 98.5 F (36.9 C) (Oral)   Resp 12   SpO2 100%  Physical Exam Vitals and nursing note reviewed.  Constitutional:      General: She is not in acute distress.    Appearance: She is not diaphoretic.  HENT:     Head: Normocephalic and atraumatic.     Mouth/Throat:     Pharynx: No oropharyngeal exudate.  Eyes:     General: No scleral icterus.    Conjunctiva/sclera: Conjunctivae normal.  Cardiovascular:     Rate and Rhythm: Normal rate and regular rhythm.     Pulses: Normal pulses.     Heart sounds: Normal heart sounds.  Pulmonary:     Effort: Pulmonary effort is normal. No respiratory distress.     Breath sounds: Normal breath sounds. No wheezing.  Abdominal:     General: Bowel sounds are normal.     Palpations: Abdomen is soft. There is no mass.     Tenderness: There is no abdominal tenderness. There is no guarding or rebound.  Musculoskeletal:        General: Normal range of motion.     Cervical back: Normal range of motion and neck supple.     Comments: Decubitus ulcer noted without TTP. No surrounding erythema noted.  Small wounds noted to left lateral thigh without erythema or drainage noted. Wound noted to right groin region with surrounding erythema and TTP to the area. No erythema tracking to the area that was previously marked by nursing facility wound nurse.  Wound noted to enter left heel with mild draining and mild tenderness to palpation to the area.  See pictures below.   Skin:    General: Skin is warm and dry.  Neurological:     Mental Status: She is alert.  Psychiatric:        Behavior: Behavior normal.    Marland Kitchen Right inguinal fold with gaping bloody wound with erythema surrounding noted to the area.  Unable to fully visualize due to patient's contractures   ^^ Decubitus ulcer   ^^ Left lateral malleolus  ED Results / Procedures / Treatments   Labs (all labs ordered are listed, but only abnormal results are  displayed) Labs Reviewed  CULTURE, BLOOD (ROUTINE X 2)  CULTURE, BLOOD (ROUTINE X 2)  LACTIC ACID, PLASMA  LACTIC ACID, PLASMA  CBC WITH DIFFERENTIAL/PLATELET  BASIC METABOLIC PANEL    EKG None  Radiology No results found.  Procedures Procedures    Medications Ordered in ED Medications - No data to display  ED Course/ Medical Decision Making/ A&P Clinical Course as of 06/03/22 1526  Sat Jun 03, 2022  1357 Discussed with patient RN regarding patient's  care.  RN notes that patient was evaluated by wound care today and had the area marked. Pt hasn't been on antibiotics.  [SB]  1510 F/u ct [CR]    Clinical Course User Index [CR] Peter Garter, PA [SB] Lipa Knauff A, PA-C                           Medical Decision Making Amount and/or Complexity of Data Reviewed Labs: ordered. Radiology: ordered.   Pt presents with concerns for wound check.  Patient lives at nursing facility.  No antibiotics recently.  Vital signs, patient afebrile, not tachycardic or hypoxic. On exam, pt with Decubitus ulcer noted without TTP. No surrounding erythema noted.  Small wounds noted to left lateral thigh without erythema or drainage noted. Wound noted to right groin region with surrounding erythema and TTP to the area. No erythema tracking to the area that was previously marked by nursing facility wound nurse.  Wound noted to enter left heel with mild draining and mild tenderness to palpation to the area. No acute cardiovascular, respiratory, abdominal exam findings. Differential diagnosis includes osteomyelitis, cellulitis, abscess.    Co morbidities that complicate the patient evaluation: Decubitus ulcer Osteomyelitis CVA PE  Additional history obtained:  External records from outside source obtained and reviewed including: Patient was admitted to the hospital in May 2023 due to osteomyelitis due to decubitus ulcer.  Labs:  I ordered, and personally interpreted labs.  The pertinent  results include:   CBC with slightly elevated WBC of 10.8 otherwise unremarkable. BMP and lactic ordered with results pending at time of sign-out. Blood cultures ordered results pending at time of signout.  Imaging: I ordered imaging studies including CT AP ordered with results pending at time of signout   Patient case discussed with Sherian Maroon, PA-C at sign-out. Plan at sign-out is pending CT scan, and disposition pending CT scan results, however, plans may change as per oncoming team. Patient care transferred at sign out.    This chart was dictated using voice recognition software, Dragon. Despite the best efforts of this provider to proofread and correct errors, errors may still occur which can change documentation meaning.   Final Clinical Impression(s) / ED Diagnoses Final diagnoses:  Visit for wound check    Rx / DC Orders ED Discharge Orders     None         Malli Falotico A, PA-C 06/03/22 1526    Pricilla Loveless, MD 06/07/22 [phone]

## 2022-06-03 NOTE — ED Provider Notes (Signed)
  Physical Exam  BP 99/60   Pulse 73   Temp 98.5 F (36.9 C) (Oral)   Resp 13   Ht 5\' 5"  (1.651 m)   Wt 56.7 kg   SpO2 91%   BMI 20.80 kg/m   Physical Exam  Procedures  Procedures  ED Course / MDM   Clinical Course as of 06/03/22 1757  Sat Jun 03, 2022  1357 Discussed with patient RN regarding patient's care.  RN notes that patient was evaluated by wound care today and had the area marked. Pt hasn't been on antibiotics.  [SB]  1510 F/u ct [CR]    Clinical Course User Index [CR] Jun 05, 2022, PA [SB] Blue, Soijett A, PA-C   Medical Decision Making Amount and/or Complexity of Data Reviewed Labs: ordered. Radiology: ordered.  Risk Prescription drug management. Decision regarding hospitalization.   History of change, patient care handoff from [facility].  Plan is to follow-up on laboratory studies as well as imaging studies with repeat evaluation to determine disposition.  In short, 61 year old female with past medical history of stroke with contracted right extremities.  Patient lives at nursing facility and has chronic decubitus ulcers followed by wound care outpatient with new wound noted right inguinal area.  Concern for secondary cellulitic skin changes as well as osteomyelitis in nursing home which sent her to Emergency Department for evaluation.  Patient also notes worsening pain in sacral area of the known decubitus ulcer due to infrequent bandage changes at nursing facility as of late.  Denies fever, chills, night sweats, abdominal pain, nausea, vomiting, chest pain, shortness of breath.  Patient admitted for sacral osteomyelitis in May of this year of which is followed with infectious disease outpatient and completed 6-week course of IV antibiotics.   Laboratory studies: Mild leukocytosis of 10.8 with left shift.  No evidence anemia.  Platelets within range.  No electrolyte abnormalities noted.  Elevated BUN of 27 isolated; patient not actively  complaining melena or hematochezia.  Renal function within normal limits.  Initial lactic acid of 1.9.  Blood cultures pending.  Imaging studies: CT abdomen pelvis: Sacral decubitus ulceration noted distal sacrum and coccyx.  No discrete inferior collection or abscess.  Patchy groundglass attenuation within paravertebral aspect of posterior medial right lower lobe.  Nonobstructing right renal calculus.  2 stones again seen in the bladder measuring up to 5 mm.  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  Aortic atherosclerosis.  Sacral/coccyx osteomyelitis and right inguinal wound with cellulitis. Vital signs within normal range and stable throughout visit Laboratory/imaging studies significant for: See above Patient deemed to meet admission criteria given evidence of continued osteomyelitis as well as right inguinal wound with surrounding cellulitic skin changes.  IV antibiotics began while in the emergency department the form of Rocephin and vancomycin.  Toradol ordered for patient's pain which improved her symptoms.  Hospital medicine Dr. June was consulted regarding patient and agreed with admission and assume further treatment/care patient..  Treatment plan discussed with patient and she was understanding was agreeable to said plan.  Patient stable upon mission to the hospital.      [name], Peter Garter 06/03/22 14/09/23, MD 06/03/22 [phone]

## 2022-06-03 NOTE — H&P (Addendum)
History and Physical    Patient: [name] [name] BUL:[id number] DOB: 1961-02-16 DOA: 06/03/2022 DOS: the patient was seen and examined on 06/03/2022 PCP: Pcp, No  Patient coming from: Via EMS  Chief Complaint:  Chief Complaint  Patient presents with   Wound Check   HPI: [name] [name] is a 61 y.o. 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  Wound in her groin was noted to be new.  She had been and evaluated by the wound nurse at the facility today who recommended her come to the emergency department.  She had been being treated for wound of her sacral for several months.  Patient complains of pain in her groin and buttocks.  Also reported complaints of burning discomfort with urination intermittently.  Denied any fevers, chills, nausea, vomiting, abdominal pain, or diarrhea.    Patient had initially been found to have osteomyelitis of the sacrum back on MRI from 11/09/2021.  She had been evaluated by ED and discharged to the skilled nursing facility on daptomycin 450 mg IV every 24 hours + Cefepime 2g IV every 8 hours + Metronidazole 500 mg po every 12 hours for 6 weeks through 6/28.  She followed up with ID on 6/27 where it was recommended to discontinue PICC line at that time and continue wound care, offloading, and keeping the area clean to help with wound healing.  In the emergency department patient was noted to have stable vital signs.  Labs noted WBC 10.8, BUN 27, creatinine 0.4, and lactic acid 1.9.  CT scan of the abdomen and pelvis noted a sacral decubitus ulceration with osteolysis at the junction between the sacrum and the coccyx consistent with osteomyelitis with stable appearance from 10/2021 without signs of abscess, patchy groundglass attenuation identified in the right lower lobe, nonobstructing right renal  calculus, and 2 stones within the bladder measuring up to 5 mm, and was unable to identify inguinal region due to artifact.  Patient had been ordered empiric antibiotics of vancomycin and Rocephin.   Started on empiric antibiotics of Rocephin and vancomycin.  Review of Systems: As mentioned in the history of present illness. All other systems reviewed and are negative. Past Medical History:  Diagnosis Date   Anxiety    Bipolar disorder ([place])    Chronic pain    CVA (cerebral vascular accident) ([place]) Jul 22, 1999   right side   Depression    Gout    Osteoporosis    Pap smear for cervical cancer screening    5+ years ago normal   Pulmonary embolism ([place])    bilateral   Tobacco abuse    Vitamin D deficiency    Past Surgical History:  Procedure Laterality Date   APPENDECTOMY     PARTIAL HIP ARTHROPLASTY Right October 2001   TOTAL HIP ARTHROPLASTY Left June 2000   Social History:  reports that she has been smoking cigarettes. She has been smoking an average of .5 packs per day. She has never used smokeless tobacco. She reports current alcohol use. She reports current drug use. Drug: Marijuana.  Allergies  Allergen Reactions   Ativan [Lorazepam]    Bactrim [Sulfamethoxazole-Trimethoprim]     Not On MAR   Paxil [Paroxetine Hcl]     Not on MAR    Family History  Problem Relation Age of Onset   Stroke Mother  Stroke Brother    Hypertension Brother    Heart murmur Brother     Prior to Admission medications   Medication Sig Start Date End Date Taking? Authorizing Provider  allopurinol (ZYLOPRIM) 100 MG tablet Take 100 mg by mouth daily.    [provider]  Amino Acids-Protein Hydrolys (PRO-STAT PO) Take 30 mLs by mouth 2 (two) times daily.    [provider]  aspirin 81 MG tablet Take 81 mg by mouth daily.    [provider]  calcium-vitamin D (OSCAL WITH D) 500-5 MG-MCG tablet Take 1 tablet by mouth 2 (two) times daily.    [provider]   cholecalciferol (VITAMIN D) 25 MCG (1000 UNIT) tablet Take 1,000 Units by mouth daily.    [provider]  cycloSPORINE (RESTASIS) 0.05 % ophthalmic emulsion Place 1 drop into both eyes 2 (two) times daily.     [provider]  diclofenac Sodium (VOLTAREN) 1 % GEL Apply 2 g topically every 6 (six) hours as needed (for pain).    [provider]  FLUoxetine (PROZAC) 40 MG capsule Take 40 mg by mouth daily.    [provider]  gabapentin (NEURONTIN) 600 MG tablet Take 600 mg by mouth every 8 (eight) hours.    [provider]  Hypromellose 0.4 % SOLN Place 2 drops into both eyes every 4 (four) hours as needed (dry eyes). Patient not taking: Reported on 11/09/2021    [provider]  ibuprofen (ADVIL) 400 MG tablet Take 800 mg by mouth every 6 (six) hours as needed (for pain).    [provider]  LACTOBACILLUS PO Take 1 capsule by mouth 2 (two) times daily.    [provider]  loperamide (IMODIUM) 2 MG capsule Take 2 mg by mouth every 6 (six) hours as needed for diarrhea or loose stools.    [provider]  magnesium oxide (MAG-OX) 400 MG tablet Take 400 mg by mouth daily.    [provider]  melatonin 3 MG TABS tablet Take 3 mg by mouth at bedtime as needed (for insomnia).    [provider]  mirtazapine (REMERON) 7.5 MG tablet Take 7.5 mg by mouth at bedtime.    [provider]  sennosides-docusate sodium (SENOKOT-S) 8.6-50 MG tablet Take 2 tablets by mouth daily as needed for constipation.    [provider]  tiZANidine (ZANAFLEX) 2 MG tablet Take 2 mg by mouth at bedtime. Then give with 62m tablet at bedtime for a total dose of 670mat midnight    [provider]  tiZANidine (ZANAFLEX) 4 MG tablet Take 4 mg by mouth 2 (two) times daily. Also give 27m58my mouth along with 2mg76mr a total dose of 6mg 23mmidnight    [provider]  vitamin C (ASCORBIC ACID) 500 MG tablet  Take 500 mg by mouth daily.    [provider]  Zinc Oxide 10 % OINT Apply 1 application. topically in the morning and at bedtime. Apply to face topically every day and evening    [provider]    Physical Exam: Vitals:   06/03/22 1445 06/03/22 1500 06/03/22 1530 06/03/22 1706  BP: 91/77 102/60 104/82 98/69  Pulse: 67 66 70 79  Resp: _0 Temp:      TempSrc:      SpO2: 99% 100% 99% 99%  Weight:      Height:       Constitutional: Older adult female  who appears to be in no acute distress at this time Eyes: PERRL, lids and conjunctivae normal ENMT: Mucous membranes are moist. Posterior pharynx clear of any exudate or lesions.  Neck: normal, supple,   Respiratory: clear to auscultation bilaterally, no wheezing, no crackles. Normal respiratory effort. No accessory muscle use.  Cardiovascular: Regular rate and rhythm, no murmurs / rubs / gallops. No extremity edema.  Abdomen: no tenderness, no masses palpated. Bowel sounds positive.  Musculoskeletal: no clubbing / cyanosis.  Contractures noted of the right upper extremity and bilateral lower extremities. Skin: Wound  of the right inguinal fold with surrounding erythema and bloody drainage present.  Sacral decubitus ulcer present without surrounding erythema, and ulceration of the left lateral ankle. Neurologic: CN 2-12 grossly intact.  4/5 in the left upper extremity Psychiatric: Normal judgment and insight. Alert and oriented x 3. Normal mood.   Data Reviewed:  Viewed labs, imaging and pertinent records as noted above in HPI.  Assessment and Plan: Cellulitis and wound of the right groin Acute.  Patient noted to have along with surrounding erythema increased warmth of the right inguinal region.  Thought secondary to chronic contractures related to prior stroke.  Patient had been started on empiric antibiotics of vancomycin and Rocephin.  MRSA had previously been detected -Admit to a MedSurg bed -Follow-up  blood cultures -Check ESR and CRP -Continue empiric antibiotics of vancomycin and Rocephin -Wound care consulted -Consider discussing with ID in a.m.  Leukocytosis WBC elevated at 10.8.  Possibly secondary to above.  However other causes include findings of opacities in the right lung base, patient's reports of dysuria, and/or osteomyelitis of the sacrum. -Check urinalysis and urine culture -Continue to monitor on antibiotics  Osteomyelitis of the sacrum Chronic.  CT the abdomen pelvis noted similar appearance when compared to 10/2021.  Patient had completed the 6 weeks of daptomycin, cefepime, and Flagyl on 12/21/2021. -Low-air-loss mattress replacement -Antibiotics as noted above  History of CVA with residual deficit Patient has chronic contractures of the right upper extremity and bilateral lower extremities which patient is bedbound -Aspiration precautions with elevation head of bed -Continue aspirin  History of chronic pain -Continue gabapentin  History of depression -Continue Prozac  DVT prophylaxis: Lovenox Advance Care Planning:   Code Status: DNR    Consults: None  Family Communication: sister in- law updated over the phone  Severity of Illness: The appropriate patient status for this patient is INPATIENT. Inpatient status is judged to be reasonable and necessary in order to provide the required intensity of service to ensure the patient's safety. The patient's presenting symptoms, physical exam findings, and initial radiographic and laboratory data in the context of their chronic comorbidities is felt to place them at high risk for further clinical deterioration. Furthermore, it is not anticipated that the patient will be medically stable for discharge from the hospital within 2 midnights of admission.   * I certify that at the point of admission it is my clinical judgment that the patient will require inpatient hospital care spanning beyond 2 midnights from the point of  admission due to high intensity of service, high risk for further deterioration and high frequency of surveillance required.*  Author: Norval Morton, MD 06/03/2022 5:44 PM  For on call review [email or web address].

## 2022-06-04 DIAGNOSIS — L0390 Cellulitis, unspecified: Secondary | ICD-10-CM | POA: Diagnosis not present

## 2022-06-04 LAB — URINALYSIS, ROUTINE W REFLEX MICROSCOPIC
Bilirubin Urine: NEGATIVE
Glucose, UA: NEGATIVE mg/dL
Hgb urine dipstick: NEGATIVE
Ketones, ur: NEGATIVE mg/dL
Leukocytes,Ua: NEGATIVE
Nitrite: NEGATIVE
Protein, ur: 30 mg/dL — AB
Specific Gravity, Urine: >1.046 — ABNORMAL HIGH (ref 1.005–1.030)
pH: 5 (ref 5.0–8.0)

## 2022-06-04 LAB — CBC
HCT: 36.3 % (ref 36.0–46.0)
Hemoglobin: 11.9 g/dL — ABNORMAL LOW (ref 12.0–15.0)
MCH: 29.1 pg (ref 26.0–34.0)
MCHC: 32.8 g/dL (ref 30.0–36.0)
MCV: 88.8 fL (ref 80.0–100.0)
Platelets: 214 10*3/uL (ref 150–400)
RBC: 4.09 MIL/uL (ref 3.87–5.11)
RDW: 16.2 % — ABNORMAL HIGH (ref 11.5–15.5)
WBC: 10.4 10*3/uL (ref 4.0–10.5)
nRBC: 0 % (ref 0.0–0.2)

## 2022-06-04 LAB — HEMOGLOBIN AND HEMATOCRIT, BLOOD
HCT: 36.2 % (ref 36.0–46.0)
Hemoglobin: 11.4 g/dL — ABNORMAL LOW (ref 12.0–15.0)

## 2022-06-04 LAB — BASIC METABOLIC PANEL
Anion gap: 8 (ref 5–15)
BUN: 27 mg/dL — ABNORMAL HIGH (ref 6–20)
CO2: 26 mmol/L (ref 22–32)
Calcium: 8.7 mg/dL — ABNORMAL LOW (ref 8.9–10.3)
Chloride: 108 mmol/L (ref 98–111)
Creatinine, Ser: 0.38 mg/dL — ABNORMAL LOW (ref 0.44–1.00)
GFR, Estimated: >60 mL/min (ref >60)
Glucose, Bld: 105 mg/dL — ABNORMAL HIGH (ref 70–99)
Potassium: 3.8 mmol/L (ref 3.5–5.1)
Sodium: 142 mmol/L (ref 135–145)

## 2022-06-04 LAB — RAPID URINE DRUG SCREEN, HOSP PERFORMED
Amphetamines: NOT DETECTED
Barbiturates: NOT DETECTED
Benzodiazepines: NOT DETECTED
Cocaine: NOT DETECTED
Opiates: NOT DETECTED
Tetrahydrocannabinol: POSITIVE — AB

## 2022-06-04 MED ORDER — KETOROLAC TROMETHAMINE 15 MG/ML IJ SOLN
15.0000 mg | Freq: Four times a day (QID) | INTRAMUSCULAR | Status: AC | PRN
  Administered 2022-06-04 – 2022-06-05 (×4): 15 mg via INTRAVENOUS
  Filled 2022-06-04 (×4): qty 1

## 2022-06-04 NOTE — Progress Notes (Signed)
Morning rounds- patient appeared to be drowsy. Patient could barely keep her eyes open when RN was speaking to her, would drift during conversation and was minimally verbally responsive. Alert and oriented x2- was not able to tell me where she was or what month/year it is. Vitals stable. See flowsheet. 3 smoking devices were in patient's purse by her side in bed with her. RN educated on no smoking hospital policy. Patient verbalized understanding. These items were removed from patient's room and given to charge RN which were then placed in appropriate place for confiscated items. Dr. Imogene Burn was notified of events and change in patient's alertness. Patient was straight cathed as patient is incontinent using sterile technique to obtain sample. UDS ordered and obtained along with standing UA and urine culture orders. Results pending.

## 2022-06-04 NOTE — Progress Notes (Signed)
Patient refused being moved around today in bed. She received care from the nurse tech due to soiled linens. She yelled but was relieved when it was over. Purewick was placed and pain medication provided. Wound assessed and dressed.

## 2022-06-04 NOTE — Progress Notes (Signed)
PROGRESS NOTE    [name] [name]  QQV:[id number] DOB: 04-30-1961 DOA: 06/03/2022 PCP: Pcp, No   Brief Narrative:  [name] [name] is a 61 y.o. 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  Wound in her groin was noted to be new.  She had been and evaluated by the wound nurse at the facility today who recommended her come to the emergency department.  She was admitted for treatment of cellulitis and wound of her right groin and has been started on Rocephin and vancomycin.  She is noted to have some confusion this morning which appears to be related to [facility] use based on UDS.  She was noted to have multiple smoking devices in her room.  Assessment & Plan:   Principal Problem:   Cellulitis Active Problems:   Leukocytosis   Sacral osteomyelitis (HCC)   History of CVA (cerebrovascular accident)   Chronic pain   History of depression  Assessment and Plan:  Cellulitis and wound of the right groin Acute.  Patient noted to have along with surrounding erythema increased warmth of the right inguinal region.  Thought secondary to chronic contractures related to prior stroke.  Patient had been started on empiric antibiotics of vancomycin and Rocephin.  MRSA had previously been detected -Admit to a MedSurg bed -Follow-up blood cultures with no growth noted thus far -Continue empiric antibiotics of vancomycin and Rocephin -Wound care consulted -Anticipate continuation of IV antibiotics while inpatient and may switch to oral agents upon discharge in the next 24-48 hours  Acute encephalopathy/somnolence In the setting of THC use Multiple vape pens have been confiscated Continue to monitor   Leukocytosis-improving WBC elevated at 10.8.  Possibly secondary to above.  However other causes include findings of opacities in the right  lung base, patient's reports of dysuria, and/or osteomyelitis of the sacrum. -Check urinalysis and urine culture -Continue to monitor on antibiotics   Osteomyelitis of the sacrum Chronic.  CT the abdomen pelvis noted similar appearance when compared to 10/2021.  Patient had completed the 6 weeks of daptomycin, cefepime, and Flagyl on 12/21/2021. -Low-air-loss mattress replacement -Antibiotics as noted above   History of CVA with residual deficit Patient has chronic contractures of the right upper extremity and bilateral lower extremities which patient is bedbound -Aspiration precautions with elevation head of bed -Continue aspirin   History of chronic pain -Continue gabapentin   History of depression -Continue Prozac    DVT prophylaxis: Lovenox Code Status: DNR Family Communication: None at bedside Disposition Plan:  Status is: Inpatient Remains inpatient appropriate because: Need for IV medications.   Skin Assessment:  I have examined the patient's skin and I agree with the wound assessment as performed by the wound care RN as outlined below:  Pressure Injury 11/09/21 Coccyx Unstageable - Full thickness tissue loss in which the base of the injury is covered by slough (yellow, tan, gray, green or brown) and/or eschar (tan, brown or black) in the wound bed. (Active)  11/09/21 0223  Location: Coccyx  Location Orientation:   Staging: Unstageable - Full thickness tissue loss in which the base of the injury is covered by slough (yellow, tan, gray, green or brown) and/or eschar (tan, brown or black) in the wound bed.  Wound Description (Comments):   Present on Admission: Yes  Dressing Type Foam - Lift dressing to assess site every shift 06/03/22  2304     Pressure Injury 11/09/21 Ankle Left;Medial Unstageable - Full thickness tissue loss in which the base of the injury is covered by slough (yellow, tan, gray, green or brown) and/or eschar (tan, brown or black) in the wound bed.  (Active)  11/09/21 0226  Location: Ankle  Location Orientation: Left;Medial  Staging: Unstageable - Full thickness tissue loss in which the base of the injury is covered by slough (yellow, tan, gray, green or brown) and/or eschar (tan, brown or black) in the wound bed.  Wound Description (Comments):   Present on Admission: Yes     Pressure Injury 11/09/21 Ankle Left;Lateral Unstageable - Full thickness tissue loss in which the base of the injury is covered by slough (yellow, tan, gray, green or brown) and/or eschar (tan, brown or black) in the wound bed. (Active)  11/09/21 0226  Location: Ankle  Location Orientation: Left;Lateral  Staging: Unstageable - Full thickness tissue loss in which the base of the injury is covered by slough (yellow, tan, gray, green or brown) and/or eschar (tan, brown or black) in the wound bed.  Wound Description (Comments):   Present on Admission: Yes  Dressing Type Foam - Lift dressing to assess site every shift 06/03/22 2309     Pressure Injury 11/09/21 Buttocks Left Unstageable - Full thickness tissue loss in which the base of the injury is covered by slough (yellow, tan, gray, green or brown) and/or eschar (tan, brown or black) in the wound bed. (Active)  11/09/21 0043  Location: Buttocks  Location Orientation: Left  Staging: Unstageable - Full thickness tissue loss in which the base of the injury is covered by slough (yellow, tan, gray, green or brown) and/or eschar (tan, brown or black) in the wound bed.  Wound Description (Comments):   Present on Admission: Yes    Consultants:  None  Procedures:  None  Antimicrobials:  Anti-infectives (From admission, onward)    Start     Dose/Rate Route Frequency Ordered Stop   06/04/22 1800  cefTRIAXone (ROCEPHIN) 2 g in sodium chloride 0.9 % 100 mL IVPB        2 g 200 mL/hr over 30 Minutes Intravenous Every 24 hours 06/03/22 1808     06/04/22 0800  vancomycin (VANCOREADY) IVPB 500 mg/100 mL        500  mg 100 mL/hr over 60 Minutes Intravenous Every 12 hours 06/03/22 1825     06/03/22 1800  vancomycin (VANCOCIN) IVPB 1000 mg/200 mL premix        1,000 mg 200 mL/hr over 60 Minutes Intravenous  Once 06/03/22 1757 06/03/22 1955   06/03/22 1715  cefTRIAXone (ROCEPHIN) 2 g in sodium chloride 0.9 % 100 mL IVPB        2 g 200 mL/hr over 30 Minutes Intravenous  Once 06/03/22 1712 06/03/22 1827      Subjective: Patient seen and evaluated today with no new acute complaints or concerns. No acute concerns or events noted overnight.  She appears to be somewhat confused this morning and is quite somnolent.  Objective: Vitals:   06/03/22 2357 06/04/22 0400 06/04/22 0546 06/04/22 0700  BP: (!) 102/58 104/63 100/70 107/74  Pulse: 76 71 (!) 54 69  Resp: 16 17  17   Temp: 98.7 F (37.1 C) 98.5 F (36.9 C)  98.7 F (37.1 C)  TempSrc: Oral Oral  Oral  SpO2: 95% 97% 96% 94%  Weight:      Height:        Intake/Output Summary (Last  24 hours) at 06/04/2022 1255 Last data filed at 06/04/2022 0900 Gross per 24 hour  Intake 96.71 ml  Output 150 ml  Net -53.29 ml   Filed Weights   06/03/22 1416  Weight: 56.7 kg    Examination:  General exam: Appears calm and comfortable, contractures throughout Respiratory system: Clear to auscultation. Respiratory effort normal. Cardiovascular system: S1 & S2 heard, RRR.  Gastrointestinal system: Abdomen is soft Central nervous system: Alert and awake Extremities: No edema Skin: Noted lesion through right groin with improvement in erythema noted.  No drainage. Psychiatry: Flat affect.    Data Reviewed: I have personally reviewed following labs and imaging studies  CBC: Recent Labs  Lab 06/03/22 1355 06/04/22 0317 06/04/22 0850  WBC 10.8* 10.4  --   NEUTROABS 8.1*  --   --   HGB 13.1 11.9* 11.4*  HCT 42.1 36.3 36.2  MCV 92.1 88.8  --   PLT 268 214  --    Basic Metabolic Panel: Recent Labs  Lab 06/03/22 1355 06/04/22 0317  NA 142 142  K  3.9 3.8  CL 106 108  CO2 28 26  GLUCOSE 91 105*  BUN 27* 27*  CREATININE 0.40* 0.38*  CALCIUM 8.9 8.7*   GFR: Estimated Creatinine Clearance: 66.9 mL/min (A) (by C-G formula based on SCr of 0.38 mg/dL (L)). Liver Function Tests: No results for input(s): "AST", "ALT", "ALKPHOS", "BILITOT", "PROT", "ALBUMIN" in the last 168 hours. No results for input(s): "LIPASE", "AMYLASE" in the last 168 hours. No results for input(s): "AMMONIA" in the last 168 hours. Coagulation Profile: No results for input(s): "INR", "PROTIME" in the last 168 hours. Cardiac Enzymes: No results for input(s): "CKTOTAL", "CKMB", "CKMBINDEX", "TROPONINI" in the last 168 hours. BNP (last 3 results) No results for input(s): "PROBNP" in the last 8760 hours. HbA1C: No results for input(s): "HGBA1C" in the last 72 hours. CBG: No results for input(s): "GLUCAP" in the last 168 hours. Lipid Profile: No results for input(s): "CHOL", "HDL", "LDLCALC", "TRIG", "CHOLHDL", "LDLDIRECT" in the last 72 hours. Thyroid Function Tests: No results for input(s): "TSH", "T4TOTAL", "FREET4", "T3FREE", "THYROIDAB" in the last 72 hours. Anemia Panel: No results for input(s): "VITAMINB12", "FOLATE", "FERRITIN", "TIBC", "IRON", "RETICCTPCT" in the last 72 hours. Sepsis Labs: Recent Labs  Lab 06/03/22 1355 06/03/22 1708  LATICACIDVEN 1.9 1.0    Recent Results (from the past 240 hour(s))  Culture, blood (routine x 2)     Status: None (Preliminary result)   Collection Time: 06/03/22  1:55 PM   Specimen: BLOOD  Result Value Ref Range Status   Specimen Description BLOOD LEFT ANTECUBITAL  Final   Special Requests   Final    BOTTLES DRAWN AEROBIC AND ANAEROBIC Blood Culture adequate volume   Culture   Final    NO GROWTH < 24 HOURS Performed at [facility], [address] [place], [place] [address]    Report Status PENDING  Incomplete  Culture, blood (routine x 2)     Status: None (Preliminary result)   Collection Time:  06/03/22  1:55 PM   Specimen: BLOOD  Result Value Ref Range Status   Specimen Description BLOOD SITE NOT SPECIFIED  Final   Special Requests   Final    BOTTLES DRAWN AEROBIC AND ANAEROBIC Blood Culture adequate volume   Culture   Final    NO GROWTH < 24 HOURS Performed at [facility], [address] [place], [place] [address]    Report Status PENDING  Incomplete  Radiology Studies: CT ABDOMEN PELVIS W CONTRAST  Result Date: 06/03/2022 CLINICAL DATA:  History of osteomyelitis. Per nursing home patient has wounds under pannus in groin area. EXAM: CT ABDOMEN AND PELVIS WITH CONTRAST TECHNIQUE: Multidetector CT imaging of the abdomen and pelvis was performed using the standard protocol following bolus administration of intravenous contrast. RADIATION DOSE REDUCTION: This exam was performed according to the departmental dose-optimization program which includes automated exposure control, adjustment of the mA and/or kV according to patient size and/or use of iterative reconstruction technique. CONTRAST:  41mL OMNIPAQUE IOHEXOL 350 MG/ML SOLN COMPARISON:  11/08/2021 FINDINGS: Lower chest: No pleural fluid. Patchy ground-glass attenuation identified within the paravertebral aspect of the posteromedial right lower lobe, image 5/5. Subsegmental atelectasis versus scar identified in the left base. Hepatobiliary: No suspicious liver lesion identified. Gallbladder appears unremarkable. Mild increase caliber of the CBD measures 7 mm. No obstructing stone. Pancreas: Unremarkable. No pancreatic ductal dilatation or surrounding inflammatory changes. Spleen: Normal in size without focal abnormality. Adrenals/Urinary Tract: Normal adrenal glands. Small bilateral kidney cysts. No follow-up imaging recommended. Upper pole right kidney stone measures 4 mm. No left renal calculi. No signs of hydronephrosis. Streak artifact diminishes exam detail at the level of the urinary bladder. Two stones are again  seen within the bladder measuring up to 5 mm, image 62/3.1 Stomach/Bowel: Gastric diverticula arises off the posterior fundus measuring 4.5 cm. Stomach is otherwise unremarkable. The appendix is visualized and appears normal. Moderate stool burden noted within the colon. No bowel wall thickening, inflammation, or distension. Vascular/Lymphatic: Aortic atherosclerosis. No signs of abdominal adenopathy or pelvic adenopathy. Inguinal lymph nodes are obscured by streak artifact from bilateral hip arthroplasty devices. Reproductive: Uterus and bilateral adnexa are unremarkable. Other: No free fluid or fluid collections within the abdomen the abdomen or pelvis. No signs of pneumoperitoneum. Extensive streak artifact from patient's hip arthroplasty device markedly diminishes exam detail in the region of the patient's pannus and groin areas. These areas may be better assessed the of direct visualization. Musculoskeletal: Sacral decubitus ulceration is again noted, image 67/3. This overlies the distal sacrum and coccyx. No discrete fluid collection to suggest abscess. As noted on the previous exam there is osteolysis at the junction between the sacrum and coccyx consistent with osteomyelitis. The appearance is stable from 11/08/2021. Status post bilateral hip arthroplasty. IMPRESSION: 1. Sacral decubitus ulceration is again noted overlying the distal sacrum and coccyx. As noted on the previous exam there is osteolysis at the junction between the sacrum and coccyx consistent with osteomyelitis. The appearance is stable from 11/08/2021. No discrete fluid collection to suggest abscess. 2. Patchy ground-glass attenuation identified within the paravertebral aspect of the posteromedial right lower lobe. This is nonspecific and may be inflammatory or infectious in etiology. 3. Nonobstructing right renal calculus. 4. Two stones are again seen within the bladder measuring up to 5 mm. 5. There is extensive streak artifact from  patient's bilateral hip arthroplasties which markedly diminishes exam detail within the patient's pannus and bilateral inguinal regions. Therefore, the reported wounds under patient's pannus within the groin areas may be better assessed with direct inspection. 6.  Aortic Atherosclerosis (ICD10-I70.0). Electronically Signed   By: Signa Kell M.D.   On: 06/03/2022 16:36        Scheduled Meds:  enoxaparin (LOVENOX) injection  40 mg Subcutaneous Q24H   gabapentin  600 mg Oral Q8H   sodium chloride flush  3 mL Intravenous Q12H   Continuous Infusions:  cefTRIAXone (ROCEPHIN)  IV  vancomycin 500 mg (06/04/22 1120)     LOS: 1 day    Time spent: 35 minutes    Tavion Senkbeil Hoover Brunette, DO [facility]  If 7PM-7AM, please contact night-coverage [email or web address] 06/04/2022, 12:55 PM

## 2022-06-05 LAB — CBC
HCT: 34.7 % — ABNORMAL LOW (ref 36.0–46.0)
Hemoglobin: 11.5 g/dL — ABNORMAL LOW (ref 12.0–15.0)
MCH: 29 pg (ref 26.0–34.0)
MCHC: 33.1 g/dL (ref 30.0–36.0)
MCV: 87.6 fL (ref 80.0–100.0)
Platelets: 203 10*3/uL (ref 150–400)
RBC: 3.96 MIL/uL (ref 3.87–5.11)
RDW: 15.9 % — ABNORMAL HIGH (ref 11.5–15.5)
WBC: 7.2 10*3/uL (ref 4.0–10.5)
nRBC: 0 % (ref 0.0–0.2)

## 2022-06-05 LAB — BASIC METABOLIC PANEL
Anion gap: 7 (ref 5–15)
BUN: 23 mg/dL — ABNORMAL HIGH (ref 6–20)
CO2: 27 mmol/L (ref 22–32)
Calcium: 8.4 mg/dL — ABNORMAL LOW (ref 8.9–10.3)
Chloride: 105 mmol/L (ref 98–111)
Creatinine, Ser: 0.42 mg/dL — ABNORMAL LOW (ref 0.44–1.00)
GFR, Estimated: >60 mL/min (ref >60)
Glucose, Bld: 111 mg/dL — ABNORMAL HIGH (ref 70–99)
Potassium: 3.7 mmol/L (ref 3.5–5.1)
Sodium: 139 mmol/L (ref 135–145)

## 2022-06-05 LAB — URINE CULTURE: Culture: NO GROWTH

## 2022-06-05 LAB — MAGNESIUM: Magnesium: 1.7 mg/dL (ref 1.7–2.4)

## 2022-06-05 NOTE — Progress Notes (Signed)
Consultation Progress Note   Patient: LONITA DEBES [id number]:[id number] DOB: 04-26-1961 DOA: 06/03/2022 DOS: the patient was seen and examined on 06/05/2022 Primary service: Loella Hickle, Elgie Collard, MD  Brief hospital course: NATAYA BASTEDO is a 61 y.o. 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  Wound in her groin was noted to be new.  She had been and evaluated by the wound nurse at the facility today who recommended her come to the emergency department.  She was admitted for treatment of cellulitis and wound of her right groin and has been started on Rocephin and vancomycin.  She is noted to have some confusion this morning which appears to be related to [facility] use based on UDS.  She was noted to have multiple smoking devices in her room.   Assessment and Plan: Principal Problem:   Cellulitis Active Problems:   Leukocytosis   Sacral osteomyelitis (HCC)   History of CVA (cerebrovascular accident)   Chronic pain   History of depression   Cellulitis and wound of the right groin Acute.  Patient noted to have along with surrounding erythema increased warmth of the right inguinal region.  Thought secondary to chronic contractures related to prior stroke.  Patient had been started on empiric antibiotics of vancomycin and Rocephin.  MRSA had previously been detected -Admit to a MedSurg bed -Follow-up blood cultures with no growth noted thus far -Continue empiric antibiotics of vancomycin and Rocephin -Wound care consulted  12/11---Anticipate continuation of IV antibiotics in the next 24-48 hours.   Acute encephalopathy/somnolence In the setting of THC use Multiple vape pens have been confiscated Continue to monitor   Leukocytosis-resolved  Possibly secondary to above.  However other causes include findings of opacities in the  right lung base, patient's reports of dysuria, and/or osteomyelitis of the sacrum.    Osteomyelitis of the sacrum Chronic.  CT the abdomen pelvis noted similar appearance when compared to 10/2021.  Patient had completed the 6 weeks of daptomycin, cefepime, and Flagyl on 12/21/2021. -Low-air-loss mattress replacement -Antibiotics as noted above   History of CVA with residual deficit Patient has chronic contractures of the right upper extremity and bilateral lower extremities which patient is bedbound -Aspiration precautions with elevation head of bed -Continue aspirin   History of chronic pain -Continue gabapentin   History of depression -Continue Prozac     DVT prophylaxis: Lovenox Code Status: DNR Family Communication: None at bedside Disposition Plan:  Status is: Inpatient Remains inpatient appropriate because: Need for IV medications.      [facility] will continue to follow the patient.  Subjective: No complaints this morning, denies fevers, chills, nausea, vomiting.  Physical Exam: Vitals:   06/04/22 2117 06/04/22 2319 06/05/22 0535 06/05/22 0827  BP: 91/63 109/70 112/65 108/65  Pulse: 72 80 70 72  Resp: 17 16 17 15   Temp: 98.7 F (37.1 C) 99 F (37.2 C)  98.1 F (36.7 C)  TempSrc: Oral   Oral  SpO2: 97% 98% 97% 98%  Weight:      Height:      General exam: Appears calm and comfortable, contractures throughout Respiratory system: Clear to auscultation. Respiratory effort normal. Cardiovascular system: S1 & S2 heard, RRR.  Gastrointestinal system: Abdomen is soft Central nervous system: Alert and awake Extremities: No edema Skin: Noted lesion through right groin with improvement in erythema noted.  No drainage. Psychiatry:  Flat affect  Data Reviewed:  There are no new results to review at this time.  Family Communication: None at bedside  Time spent: 15 minutes.  Author: Baldomero Lamy, MD 06/05/2022 9:27 AM  For on call review [email or web address].

## 2022-06-05 NOTE — Progress Notes (Signed)
CSW spoke with [name] [name] at [facility] who confirms patient can return to the facility when medically ready for discharge. Patient is a LTC resident at [facility] so no prior authorization is required.  Edwin Dada, MSW, LCSW Transitions of Care  Clinical Social Worker II [phone]

## 2022-06-05 NOTE — Consult Note (Signed)
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  Wound type: pressure, infectious Pressure Injury POA: Yes Measurement: Right inguinal 2 cm round open lesion Sacrum 4 cm x 4 cm x 1.5 cm with undermining circumferentially, extends 0.3 cm  Wound bed: beefy red, inguinal Pale pink nongranulating to sacrum Drainage (amount, consistency, odor) moderate serosanguinous  musty odor to inguinal fold Periwound: moist perineal skin.  Purewick in place.  Dressing procedure/placement/frequency: Cleanse bilateral inguinal folds with soap and water and apply Interdry  Measure and cut length of InterDry to fit in skin folds that have skin breakdown (both inguinal folds)  Tuck InterDry fabric into skin folds in a single layer, allow for 2 inches of overhang from skin edges to allow for wicking to occur May remove to bathe; dry area thoroughly and then tuck into affected areas again Do not apply any creams or ointments when using InterDry DO NOT THROW AWAY FOR 5 DAYS unless soiled with stool DO NOT [facility] product, this will inactivate the silver in the material  New sheet of Interdry should be applied after 5 days of use if patient continues to have skin breakdown  Cleanse sacral wound with NS and pat dry. Apply aquacel to open wound. Top with sacral silicone foam. Change every other day and PRN soilage.  Prevalon boots/silicone foam to heels Will not follow at this time.  Please re-consult if needed.  Mike Gip MSN, RN, FNP-BC CWON Wound, Ostomy, Continence Nurse Outpatient [facility] [phone] Pager [phone]

## 2022-06-06 MED ORDER — TRAMADOL HCL 50 MG PO TABS: 50.0000 mg | ORAL_TABLET | ORAL | 0 refills | Status: DC

## 2022-06-06 MED ORDER — CEFADROXIL 500 MG PO CAPS: 500.0000 mg | ORAL_CAPSULE | Freq: Two times a day (BID) | ORAL | 0 refills | Status: DC

## 2022-06-06 NOTE — Progress Notes (Signed)
RN called and gave report to Precious at [facility].

## 2022-06-06 NOTE — Hospital Course (Signed)
[name] [name] is a 61 y.o. 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  Wound in her groin was noted to be new.  She had been and evaluated by the wound nurse at the facility today who recommended her come to the emergency department.  She was admitted for treatment of cellulitis and wound of her right groin and has been started on Rocephin and vancomycin.  She is noted to have some confusion this morning which appears to be related to [facility] use based on UDS.  She was noted to have multiple smoking devices in her room.

## 2022-06-06 NOTE — Discharge Summary (Signed)
Physician Discharge Summary   Patient: [name] [name] MRN: [id number] DOB: 1961-03-10  Admit date:     06/03/2022  Discharge date: 06/06/22  Discharge Physician: Alberteen Sam   PCP: Pcp, No     Recommendations at discharge:  Complete 7 more days cefadroxil for pannus cellulitis     Discharge Diagnoses: Principal Problem:   Cellulitis and wound of the groin regeion Active Problems:   Acute metabolic encephalopathy   Chronic sacral osteomyelitis   Cerebrovascular disease   Chronic pain   History of depression     Hospital Course: [name] [name] is a 61 y.o. F with chronic osteomyelitis of the sacrum, no longer on treatment, prior CVA with residual right hemiparesis, and hx PE who presented with redness and tenderness in her right inguinal region.        Cellulitis and wound of the right groin Patient admitted and treated with IV antibiotics.  Blood cultures negative, redness receded.    Discharged to complete   Continue care by wound nursing.  Recommend air loss mattress or appropriate weight reduction mattress to prevent progression of her pressure ulcers.         Acute encephalopathy/somnolence Due to [facility] use.  Multiple vape pens confiscated from patient's room, per prior report.    Chronic Osteomyelitis of the sacrum Stage IV sacral decubitus ulcer, POA Chronic.  CT the abdomen pelvis noted similar appearance when compared to 10/2021.  Patient had completed the 6 weeks of daptomycin, cefepime, and Flagyl on 12/21/2021.   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  Consultants: None Procedures performed: CT abdomen and pelvis  Disposition: Long term care  facility   DISCHARGE MEDICATION: Allergies as of 06/06/2022       Reactions   Ativan [lorazepam] Other (See Comments)   Unknown reaction Listed on MAR   Bactrim [sulfamethoxazole-trimethoprim] Other (See Comments)   Unknown reaction  Listed on MAR   Paxil [paroxetine Hcl] Other (See Comments)   Unknown reaction Listed on MAR        Medication List     TAKE these medications    ascorbic acid 500 MG tablet Commonly known as: VITAMIN C Take 500 mg by mouth in the morning. (0900)   aspirin 81 MG chewable tablet Chew 81 mg by mouth every evening. (1900)   Calcium 600-D 600-10 MG-MCG Tabs Generic drug: Calcium Carb-Cholecalciferol Take 1 tablet by mouth in the morning. (0900)   cefadroxil 500 MG capsule Commonly known as: DURICEF Take 1 capsule (500 mg total) by mouth 2 (two) times daily.   cholecalciferol 25 MCG (1000 UNIT) tablet Commonly known as: VITAMIN D3 Take 1,000 Units by mouth in the morning. (0800)   FLUoxetine 40 MG capsule Commonly known as: PROZAC Take 40 mg by mouth in the morning. (0800)   gabapentin 600 MG tablet Commonly known as: NEURONTIN Take 600 mg by mouth every 8 (eight) hours. (0600, 1400, 2200)   ibuprofen 400 MG tablet Commonly known as: ADVIL Take 800 mg by mouth every 6 (six) hours as needed (pain).   LACTOBACILLUS PO Take 1 capsule by mouth 2 (two) times daily. (0800, 2000)   loperamide 2 MG capsule Commonly known as: IMODIUM  Take 2 mg by mouth every 6 (six) hours as needed for diarrhea or loose stools.   magnesium oxide 400 (240 Mg) MG tablet Commonly known as: MAG-OX Take 400 mg by mouth in the morning. (0800)   melatonin 3 MG Tabs tablet Take 3 mg by mouth at bedtime. (2100)   mirtazapine 7.5 MG tablet Commonly known as: REMERON Take 7.5 mg by mouth at bedtime. (2100)   NUTRITIONAL DRINK PO Take 1 each by mouth 3 (three) times daily. House 2.0 (supplement)   OXYGEN Inhale 2 L into the lungs as needed (oxygen  level < 90% on room air).   Pro-Stat Liqd Take 30 mLs by mouth 3 (three) times daily.   senna-docusate 8.6-50 MG tablet Commonly known as: Senokot-S Take 2 tablets by mouth See admin instructions. 2 entries on MAR: 2 tablets twice daily (0900, 1700) + "give 2 tablets by mouth as needed for constipation, give 1 tablet by mouth"   Systane Complete 0.6 % Soln Generic drug: Propylene Glycol Place 1 drop into both eyes 2 (two) times daily. (0800, 2000)   tiZANidine 4 MG tablet Commonly known as: ZANAFLEX Take 4 mg by mouth 3 (three) times daily.   traMADol 50 MG tablet Commonly known as: ULTRAM Take 1 tablet (50 mg total) by mouth See admin instructions. 2 entries on MAR : 50 mg every morning 30 minutes prior to dressing changes + 50 mg every 6 hours as needed for moderate/severe pain   Voltaren 1 % Gel Generic drug: diclofenac Sodium Apply 4 g topically every 6 (six) hours as needed (pain). To right hip   Zinc Oxide 10 % Oint Apply 1 application  topically 2 (two) times daily. (0900, 2100)           Discharge Exam: Filed Weights   06/03/22 1416  Weight: 56.7 kg    General: Pt is alert, awake, not in acute distress, watching TV Cardiovascular: RRR, nl S1-S2, no murmurs appreciated.   No LE edema.   Respiratory: Normal respiratory rate and rhythm.  CTAB without rales or wheezes. Abdominal: Abdomen soft and non-tender.  No distension or HSM.   Neuro/Psych: Strength weak on the one side.  Speech fluent, oriented x3.  Judgment and insight appear normal.   Condition at discharge: fair  The results of significant diagnostics from this hospitalization (including imaging, microbiology, ancillary and laboratory) are listed below for reference.   Imaging Studies: CT ABDOMEN PELVIS W CONTRAST  Result Date: 06/03/2022 CLINICAL DATA:  History of osteomyelitis. Per nursing home patient has wounds under pannus in groin area. EXAM: CT ABDOMEN AND PELVIS WITH CONTRAST TECHNIQUE:  Multidetector CT imaging of the abdomen and pelvis was performed using the standard protocol following bolus administration of intravenous contrast. RADIATION DOSE REDUCTION: This exam was performed according to the departmental dose-optimization program which includes automated exposure control, adjustment of the mA and/or kV according to patient size and/or use of iterative reconstruction technique. CONTRAST:  52mL OMNIPAQUE IOHEXOL 350 MG/ML SOLN COMPARISON:  11/08/2021 FINDINGS: Lower chest: No pleural fluid. Patchy ground-glass attenuation identified within the paravertebral aspect of the posteromedial right lower lobe, image 5/5. Subsegmental atelectasis versus scar identified in the left base. Hepatobiliary: No suspicious liver lesion identified. Gallbladder appears unremarkable. Mild increase caliber of the CBD measures 7 mm. No obstructing stone. Pancreas: Unremarkable. No pancreatic ductal dilatation or surrounding inflammatory changes. Spleen: Normal in size without focal abnormality. Adrenals/Urinary Tract: Normal adrenal glands. Small bilateral kidney cysts. No follow-up imaging recommended. Upper pole  right kidney stone measures 4 mm. No left renal calculi. No signs of hydronephrosis. Streak artifact diminishes exam detail at the level of the urinary bladder. Two stones are again seen within the bladder measuring up to 5 mm, image 62/3.1 Stomach/Bowel: Gastric diverticula arises off the posterior fundus measuring 4.5 cm. Stomach is otherwise unremarkable. The appendix is visualized and appears normal. Moderate stool burden noted within the colon. No bowel wall thickening, inflammation, or distension. Vascular/Lymphatic: Aortic atherosclerosis. No signs of abdominal adenopathy or pelvic adenopathy. Inguinal lymph nodes are obscured by streak artifact from bilateral hip arthroplasty devices. Reproductive: Uterus and bilateral adnexa are unremarkable. Other: No free fluid or fluid collections within the  abdomen the abdomen or pelvis. No signs of pneumoperitoneum. Extensive streak artifact from patient's hip arthroplasty device markedly diminishes exam detail in the region of the patient's pannus and groin areas. These areas may be better assessed the of direct visualization. Musculoskeletal: Sacral decubitus ulceration is again noted, image 67/3. This overlies the distal sacrum and coccyx. No discrete fluid collection to suggest abscess. As noted on the previous exam there is osteolysis at the junction between the sacrum and coccyx consistent with osteomyelitis. The appearance is stable from 11/08/2021. Status post bilateral hip arthroplasty. IMPRESSION: 1. Sacral decubitus ulceration is again noted overlying the distal sacrum and coccyx. As noted on the previous exam there is osteolysis at the junction between the sacrum and coccyx consistent with osteomyelitis. The appearance is stable from 11/08/2021. No discrete fluid collection to suggest abscess. 2. Patchy ground-glass attenuation identified within the paravertebral aspect of the posteromedial right lower lobe. This is nonspecific and may be inflammatory or infectious in etiology. 3. Nonobstructing right renal calculus. 4. Two stones are again seen within the bladder measuring up to 5 mm. 5. There is extensive streak artifact from patient's bilateral hip arthroplasties which markedly diminishes exam detail within the patient's pannus and bilateral inguinal regions. Therefore, the reported wounds under patient's pannus within the groin areas may be better assessed with direct inspection. 6.  Aortic Atherosclerosis (ICD10-I70.0). Electronically Signed   By: Signa Kell M.D.   On: 06/03/2022 16:36    Microbiology: Results for orders placed or performed during the hospital encounter of 06/03/22  Culture, blood (routine x 2)     Status: None (Preliminary result)   Collection Time: 06/03/22  1:55 PM   Specimen: BLOOD  Result Value Ref Range Status    Specimen Description BLOOD LEFT ANTECUBITAL  Final   Special Requests   Final    BOTTLES DRAWN AEROBIC AND ANAEROBIC Blood Culture adequate volume   Culture   Final    NO GROWTH 2 DAYS Performed at [facility], [address], [place], [place] [address]    Report Status PENDING  Incomplete  Culture, blood (routine x 2)     Status: None (Preliminary result)   Collection Time: 06/03/22  1:55 PM   Specimen: BLOOD  Result Value Ref Range Status   Specimen Description BLOOD SITE NOT SPECIFIED  Final   Special Requests   Final    BOTTLES DRAWN AEROBIC AND ANAEROBIC Blood Culture adequate volume   Culture   Final    NO GROWTH 2 DAYS Performed at [facility], [address] [place], [place] [address]    Report Status PENDING  Incomplete  Urine Culture     Status: None   Collection Time: 06/04/22  6:24 AM   Specimen: Urine, Clean Catch  Result Value Ref Range  Status   Specimen Description URINE, CLEAN CATCH  Final   Special Requests NONE  Final   Culture   Final    NO GROWTH Performed at [facility], [place] [address], [place], [place] [address]    Report Status 06/05/2022 FINAL  Final    Labs: CBC: Recent Labs  Lab 06/03/22 1355 06/04/22 0317 06/04/22 0850 06/05/22 0457  WBC 10.8* 10.4  --  7.2  NEUTROABS 8.1*  --   --   --   HGB 13.1 11.9* 11.4* 11.5*  HCT 42.1 36.3 36.2 34.7*  MCV 92.1 88.8  --  87.6  PLT 268 214  --  123456   Basic Metabolic Panel: Recent Labs  Lab 06/03/22 1355 06/04/22 0317 06/05/22 0457  NA 142 142 139  K 3.9 3.8 3.7  CL 106 108 105  CO2 28 26 27   GLUCOSE 91 105* 111*  BUN 27* 27* 23*  CREATININE 0.40* 0.38* 0.42*  CALCIUM 8.9 8.7* 8.4*  MG  --   --  1.7   Liver Function Tests: No results for input(s): "AST", "ALT", "ALKPHOS", "BILITOT", "PROT", "ALBUMIN" in the last 168 hours. CBG: No results for input(s): "GLUCAP" in the last 168 hours.  Discharge time spent: approximately 35 minutes spent on discharge counseling,  evaluation of patient on day of discharge, and coordination of discharge planning with nursing, social work, pharmacy and case management  Signed: Edwin Dada, MD [facility] 06/06/2022

## 2022-06-06 NOTE — Progress Notes (Signed)
CSW spoke with [name] [name] at [facility] who states the patient can be accepted at the facility today.  Patient will be transported via [facility] - RN to call when ready. The number to call for report is [phone].  Edwin Dada, MSW, LCSW Transitions of Care  Clinical Social Worker II [phone]

## 2022-06-08 LAB — CULTURE, BLOOD (ROUTINE X 2)
Culture: NO GROWTH
Culture: NO GROWTH
Special Requests: ADEQUATE
Special Requests: ADEQUATE

## 2022-11-01 ENCOUNTER — Emergency Department (HOSPITAL_COMMUNITY): Payer: Medicaid Other

## 2022-11-01 ENCOUNTER — Inpatient Hospital Stay (HOSPITAL_COMMUNITY)
Admission: EM | Admit: 2022-11-01 | Discharge: 2022-11-06 | DRG: 602 | Disposition: A | Discharge status: Skilled Nursing Facility | Payer: Medicaid Other | Source: Skilled Nursing Facility | Attending: Internal Medicine | Admitting: Internal Medicine

## 2022-11-01 ENCOUNTER — Encounter (HOSPITAL_COMMUNITY): Payer: Self-pay

## 2022-11-01 ENCOUNTER — Other Ambulatory Visit: Payer: Self-pay

## 2022-11-01 DIAGNOSIS — Z8249 Family history of ischemic heart disease and other diseases of the circulatory system: Secondary | ICD-10-CM

## 2022-11-01 DIAGNOSIS — Z86711 Personal history of pulmonary embolism: Secondary | ICD-10-CM

## 2022-11-01 DIAGNOSIS — M62462 Contracture of muscle, left lower leg: Secondary | ICD-10-CM | POA: Diagnosis present

## 2022-11-01 DIAGNOSIS — I9589 Other hypotension: Secondary | ICD-10-CM | POA: Diagnosis present

## 2022-11-01 DIAGNOSIS — L89154 Pressure ulcer of sacral region, stage 4: Secondary | ICD-10-CM | POA: Diagnosis present

## 2022-11-01 DIAGNOSIS — Z87891 Personal history of nicotine dependence: Secondary | ICD-10-CM

## 2022-11-01 DIAGNOSIS — F32A Depression, unspecified: Secondary | ICD-10-CM | POA: Diagnosis present

## 2022-11-01 DIAGNOSIS — L89159 Pressure ulcer of sacral region, unspecified stage: Secondary | ICD-10-CM | POA: Diagnosis present

## 2022-11-01 DIAGNOSIS — M25551 Pain in right hip: Secondary | ICD-10-CM | POA: Diagnosis present

## 2022-11-01 DIAGNOSIS — Z7982 Long term (current) use of aspirin: Secondary | ICD-10-CM | POA: Diagnosis not present

## 2022-11-01 DIAGNOSIS — Z959 Presence of cardiac and vascular implant and graft, unspecified: Secondary | ICD-10-CM | POA: Diagnosis not present

## 2022-11-01 DIAGNOSIS — L0390 Cellulitis, unspecified: Secondary | ICD-10-CM | POA: Diagnosis present

## 2022-11-01 DIAGNOSIS — L03314 Cellulitis of groin: Secondary | ICD-10-CM | POA: Diagnosis not present

## 2022-11-01 DIAGNOSIS — Z1152 Encounter for screening for COVID-19: Secondary | ICD-10-CM | POA: Diagnosis not present

## 2022-11-01 DIAGNOSIS — F319 Bipolar disorder, unspecified: Secondary | ICD-10-CM | POA: Diagnosis present

## 2022-11-01 DIAGNOSIS — Z66 Do not resuscitate: Secondary | ICD-10-CM | POA: Diagnosis present

## 2022-11-01 DIAGNOSIS — Z96643 Presence of artificial hip joint, bilateral: Secondary | ICD-10-CM | POA: Diagnosis present

## 2022-11-01 DIAGNOSIS — Z823 Family history of stroke: Secondary | ICD-10-CM

## 2022-11-01 DIAGNOSIS — M62461 Contracture of muscle, right lower leg: Secondary | ICD-10-CM | POA: Diagnosis present

## 2022-11-01 DIAGNOSIS — Z7401 Bed confinement status: Secondary | ICD-10-CM | POA: Diagnosis not present

## 2022-11-01 DIAGNOSIS — Z881 Allergy status to other antibiotic agents status: Secondary | ICD-10-CM

## 2022-11-01 DIAGNOSIS — G8929 Other chronic pain: Secondary | ICD-10-CM | POA: Diagnosis present

## 2022-11-01 DIAGNOSIS — Z8659 Personal history of other mental and behavioral disorders: Secondary | ICD-10-CM

## 2022-11-01 DIAGNOSIS — Z888 Allergy status to other drugs, medicaments and biological substances status: Secondary | ICD-10-CM

## 2022-11-01 DIAGNOSIS — Z8673 Personal history of transient ischemic attack (TIA), and cerebral infarction without residual deficits: Secondary | ICD-10-CM

## 2022-11-01 DIAGNOSIS — I959 Hypotension, unspecified: Secondary | ICD-10-CM

## 2022-11-01 DIAGNOSIS — D649 Anemia, unspecified: Secondary | ICD-10-CM | POA: Diagnosis present

## 2022-11-01 DIAGNOSIS — Z79899 Other long term (current) drug therapy: Secondary | ICD-10-CM | POA: Diagnosis not present

## 2022-11-01 DIAGNOSIS — I69398 Other sequelae of cerebral infarction: Secondary | ICD-10-CM

## 2022-11-01 DIAGNOSIS — M810 Age-related osteoporosis without current pathological fracture: Secondary | ICD-10-CM | POA: Diagnosis present

## 2022-11-01 DIAGNOSIS — M8668 Other chronic osteomyelitis, other site: Secondary | ICD-10-CM | POA: Diagnosis present

## 2022-11-01 DIAGNOSIS — Z882 Allergy status to sulfonamides status: Secondary | ICD-10-CM

## 2022-11-01 LAB — COMPREHENSIVE METABOLIC PANEL
ALT: 13 U/L (ref 0–44)
AST: 16 U/L (ref 15–41)
Albumin: 2.8 g/dL — ABNORMAL LOW (ref 3.5–5.0)
Alkaline Phosphatase: 80 U/L (ref 38–126)
Anion gap: 9 (ref 5–15)
BUN: 28 mg/dL — ABNORMAL HIGH (ref 6–20)
CO2: 28 mmol/L (ref 22–32)
Calcium: 9 mg/dL (ref 8.9–10.3)
Chloride: 106 mmol/L (ref 98–111)
Creatinine, Ser: 0.32 mg/dL — ABNORMAL LOW (ref 0.44–1.00)
GFR, Estimated: >60 mL/min (ref >60)
Glucose, Bld: 133 mg/dL — ABNORMAL HIGH (ref 70–99)
Potassium: 3.7 mmol/L (ref 3.5–5.1)
Sodium: 143 mmol/L (ref 135–145)
Total Bilirubin: 0.4 mg/dL (ref 0.3–1.2)
Total Protein: 5.9 g/dL — ABNORMAL LOW (ref 6.5–8.1)

## 2022-11-01 LAB — CBC WITH DIFFERENTIAL/PLATELET
Abs Immature Granulocytes: 0.05 10*3/uL (ref 0.00–0.07)
Basophils Absolute: 0 10*3/uL (ref 0.0–0.1)
Basophils Relative: 0 %
Eosinophils Absolute: 0.7 10*3/uL — ABNORMAL HIGH (ref 0.0–0.5)
Eosinophils Relative: 5 %
HCT: 38.7 % (ref 36.0–46.0)
Hemoglobin: 12 g/dL (ref 12.0–15.0)
Immature Granulocytes: 0 %
Lymphocytes Relative: 12 %
Lymphs Abs: 1.8 10*3/uL (ref 0.7–4.0)
MCH: 28.7 pg (ref 26.0–34.0)
MCHC: 31 g/dL (ref 30.0–36.0)
MCV: 92.6 fL (ref 80.0–100.0)
Monocytes Absolute: 1 10*3/uL (ref 0.1–1.0)
Monocytes Relative: 7 %
Neutro Abs: 11.5 10*3/uL — ABNORMAL HIGH (ref 1.7–7.7)
Neutrophils Relative %: 76 %
Platelets: 341 10*3/uL (ref 150–400)
RBC: 4.18 MIL/uL (ref 3.87–5.11)
RDW: 15 % (ref 11.5–15.5)
WBC: 15.2 10*3/uL — ABNORMAL HIGH (ref 4.0–10.5)
nRBC: 0 % (ref 0.0–0.2)

## 2022-11-01 LAB — URINALYSIS, W/ REFLEX TO CULTURE (INFECTION SUSPECTED)
Bilirubin Urine: NEGATIVE
Glucose, UA: NEGATIVE mg/dL
Hgb urine dipstick: NEGATIVE
Ketones, ur: NEGATIVE mg/dL
Nitrite: NEGATIVE
Protein, ur: NEGATIVE mg/dL
Specific Gravity, Urine: 1.019 (ref 1.005–1.030)
Squamous Epithelial / HPF: >50 /HPF (ref 0–5)
pH: 7 (ref 5.0–8.0)

## 2022-11-01 LAB — LACTIC ACID, PLASMA
Lactic Acid, Venous: 0.8 mmol/L (ref 0.5–1.9)
Lactic Acid, Venous: 1 mmol/L (ref 0.5–1.9)

## 2022-11-01 LAB — RESP PANEL BY RT-PCR (RSV, FLU A&B, COVID)  RVPGX2
Influenza A by PCR: NEGATIVE
Influenza B by PCR: NEGATIVE
Resp Syncytial Virus by PCR: NEGATIVE
SARS Coronavirus 2 by RT PCR: NEGATIVE

## 2022-11-01 LAB — APTT: aPTT: 30 seconds (ref 24–36)

## 2022-11-01 LAB — CORTISOL: Cortisol, Plasma: 17.8 ug/dL

## 2022-11-01 LAB — PROTIME-INR
INR: 1.1 (ref 0.8–1.2)
Prothrombin Time: 14.5 seconds (ref 11.4–15.2)

## 2022-11-01 MED ORDER — SODIUM CHLORIDE 0.9 % IV SOLN
2.0000 g | Freq: Three times a day (TID) | INTRAVENOUS | Status: DC
  Administered 2022-11-01 – 2022-11-04 (×8): 2 g via INTRAVENOUS
  Filled 2022-11-01 (×8): qty 12.5

## 2022-11-01 MED ORDER — LACTATED RINGERS IV SOLN: INTRAVENOUS | Status: AC

## 2022-11-01 MED ORDER — ASPIRIN 81 MG PO CHEW
81.0000 mg | CHEWABLE_TABLET | Freq: Every evening | ORAL | Status: DC
  Administered 2022-11-01 – 2022-11-05 (×5): 81 mg via ORAL
  Filled 2022-11-01 (×5): qty 1

## 2022-11-01 MED ORDER — ONDANSETRON HCL 4 MG/2ML IJ SOLN
4.0000 mg | Freq: Four times a day (QID) | INTRAMUSCULAR | Status: DC | PRN
  Administered 2022-11-03: 4 mg via INTRAVENOUS
  Filled 2022-11-01: qty 2

## 2022-11-01 MED ORDER — ONDANSETRON HCL 4 MG PO TABS: 4.0000 mg | ORAL_TABLET | Freq: Four times a day (QID) | ORAL | Status: DC | PRN

## 2022-11-01 MED ORDER — FLUOXETINE HCL 20 MG PO CAPS
40.0000 mg | ORAL_CAPSULE | Freq: Every morning | ORAL | Status: DC
  Administered 2022-11-02 – 2022-11-06 (×5): 40 mg via ORAL
  Filled 2022-11-01 (×5): qty 2

## 2022-11-01 MED ORDER — TIZANIDINE HCL 4 MG PO TABS
4.0000 mg | ORAL_TABLET | Freq: Three times a day (TID) | ORAL | Status: DC
  Administered 2022-11-01 – 2022-11-06 (×14): 4 mg via ORAL
  Filled 2022-11-01 (×14): qty 1

## 2022-11-01 MED ORDER — IOHEXOL 350 MG/ML SOLN
75.0000 mL | Freq: Once | INTRAVENOUS | Status: AC | PRN
  Administered 2022-11-01: 75 mL via INTRAVENOUS

## 2022-11-01 MED ORDER — POLYVINYL ALCOHOL 1.4 % OP SOLN
1.0000 [drp] | Freq: Two times a day (BID) | OPHTHALMIC | Status: DC
  Administered 2022-11-01 – 2022-11-06 (×7): 1 [drp] via OPHTHALMIC
  Filled 2022-11-01: qty 15

## 2022-11-01 MED ORDER — MELATONIN 3 MG PO TABS
3.0000 mg | ORAL_TABLET | Freq: Every day | ORAL | Status: DC
  Administered 2022-11-05: 3 mg via ORAL
  Filled 2022-11-01 (×4): qty 1

## 2022-11-01 MED ORDER — ACETAMINOPHEN 650 MG RE SUPP: 650.0000 mg | Freq: Four times a day (QID) | RECTAL | Status: DC | PRN

## 2022-11-01 MED ORDER — MAGNESIUM OXIDE -MG SUPPLEMENT 400 (240 MG) MG PO TABS
400.0000 mg | ORAL_TABLET | Freq: Every morning | ORAL | Status: DC
  Administered 2022-11-02 – 2022-11-06 (×5): 400 mg via ORAL
  Filled 2022-11-01 (×5): qty 1

## 2022-11-01 MED ORDER — ENOXAPARIN SODIUM 40 MG/0.4ML IJ SOSY
40.0000 mg | PREFILLED_SYRINGE | INTRAMUSCULAR | Status: DC
  Administered 2022-11-01 – 2022-11-05 (×5): 40 mg via SUBCUTANEOUS
  Filled 2022-11-01 (×5): qty 0.4

## 2022-11-01 MED ORDER — VANCOMYCIN HCL 1250 MG/250ML IV SOLN
1250.0000 mg | Freq: Two times a day (BID) | INTRAVENOUS | Status: DC
  Administered 2022-11-02 – 2022-11-03 (×5): 1250 mg via INTRAVENOUS
  Filled 2022-11-01 (×7): qty 250

## 2022-11-01 MED ORDER — OXYCODONE HCL 5 MG PO TABS
5.0000 mg | ORAL_TABLET | ORAL | Status: DC | PRN
  Administered 2022-11-01 – 2022-11-06 (×10): 5 mg via ORAL
  Filled 2022-11-01 (×10): qty 1

## 2022-11-01 MED ORDER — ACETAMINOPHEN 325 MG PO TABS
650.0000 mg | ORAL_TABLET | Freq: Four times a day (QID) | ORAL | Status: DC | PRN
  Administered 2022-11-01 – 2022-11-02 (×2): 650 mg via ORAL
  Filled 2022-11-01 (×2): qty 2

## 2022-11-01 MED ORDER — VANCOMYCIN HCL IN DEXTROSE 1-5 GM/200ML-% IV SOLN
1000.0000 mg | Freq: Once | INTRAVENOUS | Status: AC
  Administered 2022-11-01: 1000 mg via INTRAVENOUS
  Filled 2022-11-01: qty 200

## 2022-11-01 MED ORDER — MIRTAZAPINE 15 MG PO TABS
7.5000 mg | ORAL_TABLET | Freq: Every day | ORAL | Status: DC
  Administered 2022-11-01 – 2022-11-05 (×5): 7.5 mg via ORAL
  Filled 2022-11-01 (×5): qty 1

## 2022-11-01 MED ORDER — METRONIDAZOLE 500 MG/100ML IV SOLN
500.0000 mg | Freq: Once | INTRAVENOUS | Status: AC
  Administered 2022-11-01: 500 mg via INTRAVENOUS
  Filled 2022-11-01: qty 100

## 2022-11-01 MED ORDER — SENNOSIDES-DOCUSATE SODIUM 8.6-50 MG PO TABS
2.0000 | ORAL_TABLET | Freq: Two times a day (BID) | ORAL | Status: DC
  Administered 2022-11-02 – 2022-11-05 (×2): 2 via ORAL
  Filled 2022-11-01 (×8): qty 2

## 2022-11-01 MED ORDER — SODIUM CHLORIDE 0.9 % IV SOLN
2.0000 g | Freq: Once | INTRAVENOUS | Status: AC
  Administered 2022-11-01: 2 g via INTRAVENOUS
  Filled 2022-11-01: qty 12.5

## 2022-11-01 NOTE — Progress Notes (Signed)
Pharmacy Antibiotic Note  [name] [name] is a 62 y.o. female admitted on 11/01/2022 with sepsis. Patient reports a R groin wound infection.  Pharmacy has been consulted for Cefepime and Vancomycin dosing.  Cefepime 2g IV x1, Metronidazole 500mg  IV q12h, and Vancomycin 1000mg  IV x1 ordered in ED on 5/8  WBC 15.2, Temp 97.1 >> 97.9 HR 50s, RR 12-19 SCr 0.32  Plan: Cefepime 2g IV q8h Vancomycin 1250mg  IV q12h (eAUC ~464)    > Goal AUC 400-550    > Check vancomycin levels at steady state  Continue Metronidazole 500mg  IV q12h per MD Monitor daily CBC, temp, SCr, and for clinical signs of improvement  F/u cultures and de-escalate antibiotics as able   Height: 5\' 5"  (165.1 cm) Weight: 54.4 kg (120 lb) IBW/kg (Calculated) : 57  Temp (24hrs), Avg:97.5 F (36.4 C), Min:97.1 F (36.2 C), Max:97.9 F (36.6 C)  No results for input(s): "WBC", "CREATININE", "LATICACIDVEN", "VANCOTROUGH", "VANCOPEAK", "VANCORANDOM", "GENTTROUGH", "GENTPEAK", "GENTRANDOM", "TOBRATROUGH", "TOBRAPEAK", "TOBRARND", "AMIKACINPEAK", "AMIKACINTROU", "AMIKACIN" in the last 168 hours.  CrCl cannot be calculated (Patient's most recent lab result is older than the maximum 21 days allowed.).    Allergies  Allergen Reactions   Ativan [Lorazepam] Other (See Comments)    Unknown reaction Listed on MAR   Bactrim [Sulfamethoxazole-Trimethoprim] Other (See Comments)    Unknown reaction  Listed on MAR   Paxil [Paroxetine Hcl] Other (See Comments)    Unknown reaction Listed on MAR    Antimicrobials this admission: Cefepime 5/8 >>  Metronidazole 5/8 >>  Vancomycin 5/8 >>  Dose adjustments this admission: N/A  Microbiology results: 5/8 BCx: sent   Thank you for allowing pharmacy to be a part of this patient's care.  Wilburn Cornelia, PharmD, BCPS Clinical Pharmacist 11/01/2022 12:41 PM   Please refer to [facility] for pharmacy phone number

## 2022-11-01 NOTE — ED Notes (Signed)
ED TO INPATIENT HANDOFF REPORT  ED Nurse Name and Phone #: Brett Canales [phone]  S Name/Age/Gender [name] [name] 62 y.o. female Room/Bed: 021C/021C  Code Status   Code Status: DNR  Home/SNF/Other Home Patient oriented to: self, place, time, and situation Is this baseline? Yes   Triage Complete: Triage complete  Chief Complaint Cellulitis of right groin [L03.314]  Triage Note Pt arrives via EMS from [facility]. Pt states she has a wound that is infected in her right groin. EMS states the facility reported an elevated wbc. Pt AxOx4. Pt was recently started on ABX via a PICC line.    Allergies Allergies  Allergen Reactions   Ativan [Lorazepam] Other (See Comments)    Unknown reaction Listed on MAR   Bactrim [Sulfamethoxazole-Trimethoprim] Other (See Comments)    Unknown reaction  Listed on MAR   Paxil [Paroxetine Hcl] Other (See Comments)    Unknown reaction Listed on MAR    Level of Care/Admitting Diagnosis ED Disposition     ED Disposition  Admit   Condition  --   Comment  Hospital Area: [name] [[id number]]  Level of Care: Progressive [102]  Admit to Progressive based on following criteria: MULTISYSTEM THREATS such as stable sepsis, metabolic/electrolyte imbalance with or without encephalopathy that is responding to early treatment.  May admit patient to [facility] or [facility] if equivalent level of care is available:: No  Covid Evaluation: Confirmed COVID Negative  Diagnosis: Cellulitis of right groin [[id number]]  Admitting Physician: Osvaldo Shipper [[id number]]  Attending Physician: Osvaldo Shipper [[id number]]  Certification:: I certify this patient will need inpatient services for at least 2 midnights  Estimated Length of Stay: 3          B Medical/Surgery History Past Medical History:  Diagnosis Date   Anxiety    Bipolar disorder (HCC)    Chronic pain    CVA (cerebral vascular accident) (HCC) Jul 22, 1999   right side   Depression     Gout    Osteoporosis    Pap smear for cervical cancer screening    5+ years ago normal   Pulmonary embolism (HCC)    bilateral   Tobacco abuse    Vitamin D deficiency    Past Surgical History:  Procedure Laterality Date   APPENDECTOMY     PARTIAL HIP ARTHROPLASTY Right October 2001   TOTAL HIP ARTHROPLASTY Left June 2000     A IV Location/Drains/Wounds Patient Lines/Drains/Airways Status     Active Line/Drains/Airways     Name Placement date Placement time Site Days   PICC Single Lumen Left Basilic --  --  Basilic  --   Pressure Injury 11/09/21 Coccyx Unstageable - Full thickness tissue loss in which the base of the injury is covered by slough (yellow, tan, gray, green or brown) and/or eschar (tan, brown or black) in the wound bed. 11/09/21  0223  -- 357   Pressure Injury 11/09/21 Ankle Left;Medial Unstageable - Full thickness tissue loss in which the base of the injury is covered by slough (yellow, tan, gray, green or brown) and/or eschar (tan, brown or black) in the wound bed. 11/09/21  0226  -- 357   Pressure Injury 11/09/21 Ankle Left;Lateral Unstageable - Full thickness tissue loss in which the base of the injury is covered by slough (yellow, tan, gray, green or brown) and/or eschar (tan, brown or black) in the wound bed. 11/09/21  0226  -- 357   Pressure Injury 11/09/21 Buttocks Left Unstageable -  Full thickness tissue loss in which the base of the injury is covered by slough (yellow, tan, gray, green or brown) and/or eschar (tan, brown or black) in the wound bed. 11/09/21  0043  -- 357   Wound / Incision (Open or Dehisced) 06/03/22 Thigh Left;Posterior;Proximal 06/03/22  1920  Thigh  151   Wound / Incision (Open or Dehisced) 06/03/22 Thigh Anterior;Proximal;Right Left inguinal 06/03/22  1920  Thigh  151            Intake/Output Last 24 hours No intake or output data in the 24 hours ending 11/01/22 1734  Labs/Imaging Results for orders placed or performed during the  hospital encounter of 11/01/22 (from the past 48 hour(s))  Lactic acid, plasma     Status: None   Collection Time: 11/01/22 12:34 PM  Result Value Ref Range   Lactic Acid, Venous 0.8 0.5 - 1.9 mmol/L    Comment: Performed at [facility], [address], [place], [place] [address]  Comprehensive metabolic panel     Status: Abnormal   Collection Time: 11/01/22 12:34 PM  Result Value Ref Range   Sodium 143 135 - 145 mmol/L   Potassium 3.7 3.5 - 5.1 mmol/L   Chloride 106 98 - 111 mmol/L   CO2 28 22 - 32 mmol/L   Glucose, Bld 133 (H) 70 - 99 mg/dL    Comment: Glucose reference range applies only to samples taken after fasting for at least 8 hours.   BUN 28 (H) 6 - 20 mg/dL   Creatinine, Ser 6.04 (L) 0.44 - 1.00 mg/dL   Calcium 9.0 8.9 - 54.0 mg/dL   Total Protein 5.9 (L) 6.5 - 8.1 g/dL   Albumin 2.8 (L) 3.5 - 5.0 g/dL   AST 16 15 - 41 U/L   ALT 13 0 - 44 U/L   Alkaline Phosphatase 80 38 - 126 U/L   Total Bilirubin 0.4 0.3 - 1.2 mg/dL   GFR, Estimated >98 >11 mL/min    Comment: (NOTE) Calculated using the CKD-EPI Creatinine Equation (2021)    Anion gap 9 5 - 15    Comment: Performed at [facility], [address], [place], [place] [address]  CBC with Differential     Status: Abnormal   Collection Time: 11/01/22 12:34 PM  Result Value Ref Range   WBC 15.2 (H) 4.0 - 10.5 K/uL   RBC 4.18 3.87 - 5.11 MIL/uL   Hemoglobin 12.0 12.0 - 15.0 g/dL   HCT 29.5 62.1 - 30.8 %   MCV 92.6 80.0 - 100.0 fL   MCH 28.7 26.0 - 34.0 pg   MCHC 31.0 30.0 - 36.0 g/dL   RDW 65.7 84.6 - 96.2 %   Platelets 341 150 - 400 K/uL   nRBC 0.0 0.0 - 0.2 %   Neutrophils Relative % 76 %   Neutro Abs 11.5 (H) 1.7 - 7.7 K/uL   Lymphocytes Relative 12 %   Lymphs Abs 1.8 0.7 - 4.0 K/uL   Monocytes Relative 7 %   Monocytes Absolute 1.0 0.1 - 1.0 K/uL   Eosinophils Relative 5 %   Eosinophils Absolute 0.7 (H) 0.0 - 0.5 K/uL   Basophils Relative 0 %   Basophils Absolute 0.0 0.0 - 0.1 K/uL   Immature  Granulocytes 0 %   Abs Immature Granulocytes 0.05 0.00 - 0.07 K/uL    Comment: Performed at [facility], [address] [place], [place] [address]  Protime-INR  Status: None   Collection Time: 11/01/22 12:34 PM  Result Value Ref Range   Prothrombin Time 14.5 11.4 - 15.2 seconds   INR 1.1 0.8 - 1.2    Comment: (NOTE) INR goal varies based on device and disease states. Performed at [facility], [address] [place], [place] [address]   APTT     Status: None   Collection Time: 11/01/22 12:34 PM  Result Value Ref Range   aPTT 30 24 - 36 seconds    Comment: Performed at [facility], [address] [place], [place] [address]  Resp panel by RT-PCR (RSV, Flu A&B, Covid) Anterior Nasal Swab     Status: None   Collection Time: 11/01/22  1:41 PM   Specimen: Anterior Nasal Swab  Result Value Ref Range   SARS Coronavirus 2 by RT PCR NEGATIVE NEGATIVE   Influenza A by PCR NEGATIVE NEGATIVE   Influenza B by PCR NEGATIVE NEGATIVE    Comment: (NOTE) The Xpert Xpress SARS-CoV-2/FLU/RSV plus assay is intended as an aid in the diagnosis of influenza from Nasopharyngeal swab specimens and should not be used as a sole basis for treatment. Nasal washings and aspirates are unacceptable for Xpert Xpress SARS-CoV-2/FLU/RSV testing.  Fact Sheet for Patients: BloggerCourse.com  Fact Sheet for Healthcare Providers: SeriousBroker.it  This test is not yet approved or cleared by the [place] FDA and has been authorized for detection and/or diagnosis of SARS-CoV-2 by FDA under an Emergency Use Authorization (EUA). This EUA will remain in effect (meaning this test can be used) for the duration of the COVID-19 declaration under Section 564(b)(1) of the Act, 21 U.S.C. section 360bbb-3(b)(1), unless the authorization is terminated or revoked.     Resp Syncytial Virus by PCR NEGATIVE NEGATIVE    Comment: (NOTE) Fact Sheet  for Patients: BloggerCourse.com  Fact Sheet for Healthcare Providers: SeriousBroker.it  This test is not yet approved or cleared by the [place] FDA and has been authorized for detection and/or diagnosis of SARS-CoV-2 by FDA under an Emergency Use Authorization (EUA). This EUA will remain in effect (meaning this test can be used) for the duration of the COVID-19 declaration under Section 564(b)(1) of the Act, 21 U.S.C. section 360bbb-3(b)(1), unless the authorization is terminated or revoked.  Performed at [facility], [address], [place], [place] [address]   Urinalysis, w/ Reflex to Culture (Infection Suspected) -Urine, Clean Catch     Status: Abnormal   Collection Time: 11/01/22  1:47 PM  Result Value Ref Range   Specimen Source URINE, CLEAN CATCH    Color, Urine YELLOW YELLOW   APPearance TURBID (A) CLEAR   Specific Gravity, Urine 1.019 1.005 - 1.030   pH 7.0 5.0 - 8.0   Glucose, UA NEGATIVE NEGATIVE mg/dL   Hgb urine dipstick NEGATIVE NEGATIVE   Bilirubin Urine NEGATIVE NEGATIVE   Ketones, ur NEGATIVE NEGATIVE mg/dL   Protein, ur NEGATIVE NEGATIVE mg/dL   Nitrite NEGATIVE NEGATIVE   Leukocytes,Ua TRACE (A) NEGATIVE   RBC / HPF 0-5 0 - 5 RBC/hpf   WBC, UA 6-10 0 - 5 WBC/hpf    Comment:        Reflex urine culture not performed if WBC <=10, OR if Squamous epithelial cells >5. If Squamous epithelial cells >5 suggest recollection.    Bacteria, UA RARE (A) NONE SEEN   Squamous Epithelial / HPF >50 0 - 5 /HPF   Mucus PRESENT    Amorphous Crystal PRESENT  Ca Oxalate Crys, UA PRESENT     Comment: Performed at [facility], [address], [place], [place] [address]  Lactic acid, plasma     Status: None   Collection Time: 11/01/22  3:33 PM  Result Value Ref Range   Lactic Acid, Venous 1.0 0.5 - 1.9 mmol/L    Comment: Performed at [facility], [address], [place], [place] [address]   CT  ABDOMEN PELVIS W CONTRAST  Result Date: 11/01/2022 CLINICAL DATA:  Abdominal, postoperative, history of sacral osteomyelitis EXAM: CT ABDOMEN AND PELVIS WITH CONTRAST TECHNIQUE: Multidetector CT imaging of the abdomen and pelvis was performed using the standard protocol following bolus administration of intravenous contrast. RADIATION DOSE REDUCTION: This exam was performed according to the departmental dose-optimization program which includes automated exposure control, adjustment of the mA and/or kV according to patient size and/or use of iterative reconstruction technique. CONTRAST:  75mL OMNIPAQUE IOHEXOL 350 MG/ML SOLN COMPARISON:  06/03/2022 FINDINGS: Lower chest: Dependent bibasilar atelectasis or consolidation, more conspicuous on the left (series 4, image 8). Coronary artery calcifications. Hepatobiliary: No solid liver abnormality is seen. No gallstones, gallbladder wall thickening, or biliary dilatation. Pancreas: Unremarkable. No pancreatic ductal dilatation or surrounding inflammatory changes. Spleen: Normal in size without significant abnormality. Adrenals/Urinary Tract: Adrenal glands are unremarkable. Simple, benign right renal cortical cysts, for which no further follow-up or characterization is required. Small nonobstructive calculus of the superior pole of the right kidney. No left-sided calculi, ureteral calculi, or hydronephrosis. Bladder is normal. Stomach/Bowel: Diverticulum of the fundus (series 3, image 22). Stomach is otherwise normal. Appendix appears normal. No evidence of bowel wall thickening, distention, or inflammatory changes. Large burden of stool and stool balls throughout the colon and rectum. Vascular/Lymphatic: Aortic atherosclerosis. No enlarged abdominal or pelvic lymph nodes. Reproductive: No mass or other significant abnormality. Other: No abdominal wall hernia or abnormality. No ascites. Musculoskeletal: Osteopenia. New, although age indeterminate superior endplate wedge  deformity of L4 (series 7, image 94). Status post bilateral hip arthroplasty. Severe sarcopenia. Decubitus ulceration of the lower sacrum and coccyx (series 3, image 71). Decubitus ulceration of the left ischium and hip (series 3, image 71). No fluid collection or other acute findings. IMPRESSION: 1. No acute CT findings of the abdomen or pelvis. 2. Decubitus ulceration of the lower sacrum and coccyx. Decubitus ulceration of the left ischium and hip. No fluid collection or other acute findings. MRI is the test of choice to assess for bone marrow edema and osteomyelitis if clinically suspected. 3. New, although age indeterminate superior endplate wedge deformity of L4. Correlate for acute point tenderness. 4. Nonobstructive right nephrolithiasis. 5. Dependent bibasilar atelectasis or consolidation, more conspicuous on the left. 6. Coronary artery disease. Aortic Atherosclerosis (ICD10-I70.0). Electronically Signed   By: Jearld Lesch M.D.   On: 11/01/2022 16:15   DG Chest Port 1 View  Result Date: 11/01/2022 CLINICAL DATA:  Questionable sepsis EXAM: PORTABLE CHEST 1 VIEW COMPARISON:  CXR 11/21/17 FINDINGS: No pleural effusion. No pneumothorax. No focal airspace opacity. Normal cardiac and mediastinal contours. No focal airspace opacity. No radiographically apparent displaced rib fractures. There are prominent bilateral interstitial opacities which could represent atypical infection or pulmonary venous congestion. IMPRESSION: Prominent bilateral interstitial opacities could represent atypical infection or pulmonary venous congestion. Electronically Signed   By: Lorenza Cambridge M.D.   On: 11/01/2022 12:35    Pending Labs Unresulted Labs (From admission, onward)     Start     Ordered   11/02/22 0500  Basic metabolic panel  Daily,   R      11/01/22 1425   11/02/22 0500  CBC  Daily,   R      11/01/22 1425   11/02/22 0500  Procalcitonin  Tomorrow morning,   R       References:    Procalcitonin Lower Respiratory  Tract Infection AND Sepsis Procalcitonin Algorithm   11/01/22 1728   11/01/22 1218  Blood Culture (routine x 2)  (Septic presentation on arrival (screening labs, nursing and treatment orders for obvious sepsis))  BLOOD CULTURE X 2,   STAT      11/01/22 1218            Vitals/Pain Today's Vitals   11/01/22 1300 11/01/22 1430 11/01/22 1530 11/01/22 1635  BP: 108/67 97/67 (!) 96/57   Pulse: (!) 56 (!) 58 (!) 59   Resp: 16 13 13    Temp:    98.4 F (36.9 C)  TempSrc:    Oral  SpO2: 90% 93% 96%   Weight:      Height:      PainSc:        Isolation Precautions No active isolations  Medications Medications  lactated ringers infusion ( Intravenous New Bag/Given 11/01/22 1407)  ceFEPIme (MAXIPIME) 2 g in sodium chloride 0.9 % 100 mL IVPB (has no administration in time range)  vancomycin (VANCOREADY) IVPB 1250 mg/250 mL (has no administration in time range)  aspirin chewable tablet 81 mg (has no administration in time range)  FLUoxetine (PROZAC) capsule 40 mg (has no administration in time range)  mirtazapine (REMERON) tablet 7.5 mg (has no administration in time range)  senna-docusate (Senokot-S) tablet 2 tablet (has no administration in time range)  melatonin tablet 3 mg (has no administration in time range)  tiZANidine (ZANAFLEX) tablet 4 mg (has no administration in time range)  magnesium oxide (MAG-OX) tablet 400 mg (has no administration in time range)  Propylene Glycol 0.6 % SOLN 1 drop (has no administration in time range)  enoxaparin (LOVENOX) injection 40 mg (has no administration in time range)  ondansetron (ZOFRAN) tablet 4 mg (has no administration in time range)    Or  ondansetron (ZOFRAN) injection 4 mg (has no administration in time range)  acetaminophen (TYLENOL) tablet 650 mg (has no administration in time range)    Or  acetaminophen (TYLENOL) suppository 650 mg (has no administration in time range)  oxyCODONE (Oxy IR/ROXICODONE) immediate release tablet 5 mg (has  no administration in time range)  ceFEPIme (MAXIPIME) 2 g in sodium chloride 0.9 % 100 mL IVPB (0 g Intravenous Stopped 11/01/22 1438)  metroNIDAZOLE (FLAGYL) IVPB 500 mg (0 mg Intravenous Stopped 11/01/22 1632)  vancomycin (VANCOCIN) IVPB 1000 mg/200 mL premix (0 mg Intravenous Stopped 11/01/22 1734)  iohexol (OMNIPAQUE) 350 MG/ML injection 75 mL (75 mLs Intravenous Contrast Given 11/01/22 1557)    Mobility non-ambulatory     Focused Assessments Pressure injury to coccyx and right groin.   R Recommendations: See Admitting Provider Note  Report given to:   Additional Notes:

## 2022-11-01 NOTE — H&P (Signed)
[facility] History and Physical  [name] GMW:[id number] DOB: 01/12/1961 DOA: 11/01/2022   PCP: Pcp, No  Specialists: None  Chief Complaint: Infection in the right groin area  HPI: [name] [name] is a 62 y.o. 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 About a week ago the staff at the nursing facility noticed that her right groin area was looking more erythematous than usual.  [name] [name] has a history of infection in that area and was hospitalized in December 2023 and was treated with IV antibiotics at that time.  It looks like a PICC line was placed at the skilled nursing facility a few days ago. Patient was started on ertapenem and ciprofloxacin.  Despite this her erythema did not improve and this morning [name] [name] was noted to have low blood pressures and her WBC was noted to be elevated.  [name] [name] was sent over to the emergency department.  Patient is a poor historian.  [name] [name] denies any pain at this time.  Denies any dizziness or lightheadedness.  No nausea or vomiting.  No shortness of breath.  [name] [name] mentions that [name] [name] also has a wound on her sacral area which has been healing well.    In the emergency department [name] [name] was noted to have leukocytosis.  Blood pressures were low.  Lactic acid was reassuringly normal.  [name] [name] underwent CT scan which did not show any deep infection or abscess.  Since [name] [name] appears to have failed outpatient treatment of her groin cellulitis [name] [name] will be hospitalized for further management.  Home Medications: This list is not reconciled yet. Prior to Admission medications   Medication Sig Start Date End Date Taking? Authorizing Provider  Amino Acids-Protein Hydrolys (PRO-STAT) LIQD Take 30 mLs by mouth 3 (three) times daily.     [provider]  ascorbic acid (VITAMIN C) 500 MG tablet Take 500 mg by mouth in the morning. (0900)    [provider]  aspirin 81 MG chewable tablet Chew 81 mg by mouth every evening. (1900)    [provider]  Calcium Carb-Cholecalciferol (CALCIUM 600-D) 600-10 MG-MCG TABS Take 1 tablet by mouth in the morning. (0900)    [provider]  cefadroxil (DURICEF) 500 MG capsule Take 1 capsule (500 mg total) by mouth 2 (two) times daily. 06/06/22   Danford, Earl Lites, MD  cholecalciferol (VITAMIN D3) 25 MCG (1000 UNIT) tablet Take 1,000 Units by mouth in the morning. (0800)    [provider]  diclofenac Sodium (VOLTAREN) 1 % GEL Apply 4 g topically every 6 (six) hours as needed (pain). To right hip    [provider]  FLUoxetine (PROZAC) 40 MG capsule Take 40 mg by mouth in the morning. (0800)    [provider]  gabapentin (NEURONTIN) 600 MG tablet Take 600 mg by mouth every 8 (eight) hours. (0600, 1400, 2200)    [provider]  ibuprofen (ADVIL) 400 MG tablet Take 800 mg by mouth every 6 (six) hours as needed (pain).    [provider]  LACTOBACILLUS PO Take 1 capsule by mouth 2 (two) times daily. (0800, 2000)    [provider]  loperamide (IMODIUM) 2 MG capsule Take 2 mg by mouth every 6 (six) hours as needed for diarrhea or  loose stools.    [provider]  magnesium oxide (MAG-OX) 400 (240 Mg) MG tablet Take 400 mg by mouth in the morning. (0800)    [provider]  melatonin 3 MG TABS tablet Take 3 mg by mouth at bedtime. (2100)    [provider]  mirtazapine (REMERON) 7.5 MG tablet Take 7.5 mg by mouth at bedtime. (2100)    [provider]  Nutritional Supplements (NUTRITIONAL DRINK PO) Take 1 each by mouth 3 (three) times daily. House 2.0 (supplement)    [provider]  OXYGEN Inhale 2 L into the lungs as needed (oxygen level < 90% on room air).     [provider]  Propylene Glycol (SYSTANE COMPLETE) 0.6 % SOLN Place 1 drop into both eyes 2 (two) times daily. (0800, 2000)    [provider]  senna-docusate (SENOKOT-S) 8.6-50 MG tablet Take 2 tablets by mouth See admin instructions. 2 entries on MAR: 2 tablets twice daily (0900, 1700) + "give 2 tablets by mouth as needed for constipation, give 1 tablet by mouth"    [provider]  tiZANidine (ZANAFLEX) 4 MG tablet Take 4 mg by mouth 3 (three) times daily.    [provider]  traMADol (ULTRAM) 50 MG tablet Take 1 tablet (50 mg total) by mouth See admin instructions. 2 entries on MAR : 50 mg every morning 30 minutes prior to dressing changes + 50 mg every 6 hours as needed for moderate/severe pain 06/06/22   Danford, Earl Lites, MD  Zinc Oxide 10 % OINT Apply 1 application  topically 2 (two) times daily. (0900, 2100)    [provider]    Allergies:  Allergies  Allergen Reactions   Ativan [Lorazepam] Other (See Comments)    Unknown reaction Listed on MAR   Bactrim [Sulfamethoxazole-Trimethoprim] Other (See Comments)    Unknown reaction  Listed on MAR   Paxil [Paroxetine Hcl] Other (See Comments)    Unknown reaction Listed on [facility]    Past Medical History: Past Medical History:  Diagnosis Date   Anxiety    Bipolar disorder (HCC)    Chronic pain    CVA (cerebral vascular accident) (HCC) Jul 22, 1999   right side   Depression    Gout    Osteoporosis    Pap smear for cervical cancer screening    5+ years ago normal   Pulmonary embolism (HCC)    bilateral   Tobacco abuse    Vitamin D deficiency     Past Surgical History:  Procedure Laterality Date   APPENDECTOMY     PARTIAL HIP ARTHROPLASTY Right October 2001   TOTAL HIP ARTHROPLASTY Left June 2000    Social History: Lives in current nursing facility.  No alcohol use or recreational drug use.   Family History:  Family History  Problem Relation Age of Onset   Stroke  Mother    Stroke Brother    Hypertension Brother    Heart murmur Brother      Review of Systems - History obtained from the patient General ROS: negative Psychological ROS: negative Ophthalmic ROS: negative ENT ROS: negative Allergy and Immunology ROS: negative Hematological and Lymphatic ROS: negative Endocrine ROS: negative Respiratory ROS: no cough, shortness of breath, or wheezing Cardiovascular ROS: no chest pain or dyspnea on exertion Gastrointestinal ROS: no abdominal pain, change in bowel habits, or black or bloody stools Genito-Urinary ROS: no dysuria, trouble voiding, or hematuria Musculoskeletal ROS: negative Neurological ROS: no TIA or stroke  symptoms Dermatological ROS: negative  Physical Examination  Vitals:   11/01/22 1300 11/01/22 1430 11/01/22 1530 11/01/22 1635  BP: 108/67 97/67 (!) 96/57   Pulse: (!) 56 (!) 58 (!) 59   Resp: 16 13 13    Temp:    98.4 F (36.9 C)  TempSrc:    Oral  SpO2: 90% 93% 96%   Weight:      Height:        BP (!) 96/57   Pulse (!) 59   Temp 98.4 F (36.9 C) (Oral)   Resp 13   Ht 5\' 5"  (1.651 m)   Wt 54.4 kg   SpO2 96%   BMI 19.97 kg/m   General appearance: alert, cooperative, appears stated age, and no distress Head: Normocephalic, without obvious abnormality, atraumatic Eyes: conjunctivae/corneas clear. PERRL, EOM's intact.  Throat: lips, mucosa, and tongue normal; teeth and gums normal Neck: no adenopathy, no carotid bruit, no JVD, supple, symmetrical, trachea midline, and thyroid not enlarged, symmetric, no tenderness/mass/nodules Back: symmetric, no curvature. ROM normal. No CVA tenderness. Resp: clear to auscultation bilaterally Cardio: regular rate and rhythm, S1, S2 normal, no murmur, click, rub or gallop GI: soft, non-tender; bowel sounds normal; no masses,  no organomegaly.  Due to her significant lower extremity contractures it was very difficult to examine the groin area.  There are pictures in the chart taken  by the ED provider.  There is evidence of erythema in the right groin area.  No obvious fluctuant area was noted. [name] [name] does have a lumbosacral area but no erythema is noted.  No drainage was noted.   Extremities: Contracted lower extremities noted Pulses: 2+ and symmetric Skin: Skin color, texture, turgor normal. No rashes or lesions Lymph nodes: Cervical, supraclavicular, and axillary nodes normal. Alert.  Oriented x 3.  Except for unknown neurological deficits no other neurodeficits noted.    Labs on Admission: I have personally reviewed following labs and imaging studies  CBC: Recent Labs  Lab 11/01/22 1234  WBC 15.2*  NEUTROABS 11.5*  HGB 12.0  HCT 38.7  MCV 92.6  PLT 341   Basic Metabolic Panel: Recent Labs  Lab 11/01/22 1234  NA 143  K 3.7  CL 106  CO2 28  GLUCOSE 133*  BUN 28*  CREATININE 0.32*  CALCIUM 9.0   GFR: Estimated Creatinine Clearance: 64.2 mL/min (A) (by C-G formula based on SCr of 0.32 mg/dL (L)). Liver Function Tests: Recent Labs  Lab 11/01/22 1234  AST 16  ALT 13  ALKPHOS 80  BILITOT 0.4  PROT 5.9*  ALBUMIN 2.8*    Coagulation Profile: Recent Labs  Lab 11/01/22 1234  INR 1.1     Radiological Exams on Admission: CT ABDOMEN PELVIS W CONTRAST  Result Date: 11/01/2022 CLINICAL DATA:  Abdominal, postoperative, history of sacral osteomyelitis EXAM: CT ABDOMEN AND PELVIS WITH CONTRAST TECHNIQUE: Multidetector CT imaging of the abdomen and pelvis was performed using the standard protocol following bolus administration of intravenous contrast. RADIATION DOSE REDUCTION: This exam was performed according to the departmental dose-optimization program which includes automated exposure control, adjustment of the mA and/or kV according to patient size and/or use of iterative reconstruction technique. CONTRAST:  75mL OMNIPAQUE IOHEXOL 350 MG/ML SOLN COMPARISON:  06/03/2022 FINDINGS: Lower chest: Dependent bibasilar atelectasis or consolidation, more  conspicuous on the left (series 4, image 8). Coronary artery calcifications. Hepatobiliary: No solid liver abnormality is seen. No gallstones, gallbladder wall thickening, or biliary dilatation. Pancreas: Unremarkable. No pancreatic ductal dilatation or surrounding inflammatory changes.  Spleen: Normal in size without significant abnormality. Adrenals/Urinary Tract: Adrenal glands are unremarkable. Simple, benign right renal cortical cysts, for which no further follow-up or characterization is required. Small nonobstructive calculus of the superior pole of the right kidney. No left-sided calculi, ureteral calculi, or hydronephrosis. Bladder is normal. Stomach/Bowel: Diverticulum of the fundus (series 3, image 22). Stomach is otherwise normal. Appendix appears normal. No evidence of bowel wall thickening, distention, or inflammatory changes. Large burden of stool and stool balls throughout the colon and rectum. Vascular/Lymphatic: Aortic atherosclerosis. No enlarged abdominal or pelvic lymph nodes. Reproductive: No mass or other significant abnormality. Other: No abdominal wall hernia or abnormality. No ascites. Musculoskeletal: Osteopenia. New, although age indeterminate superior endplate wedge deformity of L4 (series 7, image 94). Status post bilateral hip arthroplasty. Severe sarcopenia. Decubitus ulceration of the lower sacrum and coccyx (series 3, image 71). Decubitus ulceration of the left ischium and hip (series 3, image 71). No fluid collection or other acute findings. IMPRESSION: 1. No acute CT findings of the abdomen or pelvis. 2. Decubitus ulceration of the lower sacrum and coccyx. Decubitus ulceration of the left ischium and hip. No fluid collection or other acute findings. MRI is the test of choice to assess for bone marrow edema and osteomyelitis if clinically suspected. 3. New, although age indeterminate superior endplate wedge deformity of L4. Correlate for acute point tenderness. 4. Nonobstructive  right nephrolithiasis. 5. Dependent bibasilar atelectasis or consolidation, more conspicuous on the left. 6. Coronary artery disease. Aortic Atherosclerosis (ICD10-I70.0). Electronically Signed   By: Jearld Lesch M.D.   On: 11/01/2022 16:15   DG Chest Port 1 View  Result Date: 11/01/2022 CLINICAL DATA:  Questionable sepsis EXAM: PORTABLE CHEST 1 VIEW COMPARISON:  CXR 11/21/17 FINDINGS: No pleural effusion. No pneumothorax. No focal airspace opacity. Normal cardiac and mediastinal contours. No focal airspace opacity. No radiographically apparent displaced rib fractures. There are prominent bilateral interstitial opacities which could represent atypical infection or pulmonary venous congestion. IMPRESSION: Prominent bilateral interstitial opacities could represent atypical infection or pulmonary venous congestion. Electronically Signed   By: Lorenza Cambridge M.D.   On: 11/01/2022 12:35    My interpretation of Electrocardiogram: Sinus rhythm in the 50s.  Normal axis.  Intervals normal.  No concerning ST or T wave changes are noted.   Problem List  Principal Problem:   Cellulitis of right groin Active Problems:   Cellulitis and wound of the groin regeion   History of CVA (cerebrovascular accident)   History of depression   Decubitus ulcer of sacral region   Assessment: This is a 62 year old Caucasian female with past medical history as stated earlier who lives in a skilled nursing facility and comes in with cellulitis of her right groin.  [name] [name] has been previously hospitalized for similar condition in the past.  [name] [name] was noted to be hypotensive although [name] [name] does not appear to be symptomatic.  Plan: #1. Cellulitis of the right groin: [name] [name] failed outpatient treatment with ertapenem and ciprofloxacin which was started a few days prior to this admission.  A PICC line was placed at the skilled nursing facility.  Follow-up on cultures.  Treat her with vancomycin and cefepime for now.  Check procalcitonin level.   The scan did not show any obvious deep infection or abscess.  #2. Hypotension: [name] [name] tends to have low blood pressures at baseline.  Lactic acid was reassuringly normal.  [name] [name] does not have any symptoms at this time.  Fluid bolus has been provided.  Continue to monitor closely.  Will check a cortisol level.  [name] [name] is not on any blood pressure lowering agents.  #3. Sacral decubitus with chronic osteomyelitis: This area appears to be healing.  No obvious evidence of active infection.  Wound care.  #4. Previous history of stroke with lower extremity contractures: Stable.  Continue aspirin.  #5. History of depression: Continue with SSRI.   DVT Prophylaxis: Lovenox Code Status: DNR.  There is a yellow DNR form in paperwork sent from her skilled nursing facility. Verified with patient. Family Communication: Discussed with patient.  No family at bedside Disposition: Anticipate return back to SNF when improved Consults called: None yet Admission Status: Status is: Inpatient Remains inpatient appropriate because: Need for IV antibiotics.    Severity of Illness: The appropriate patient status for this patient is INPATIENT. Inpatient status is judged to be reasonable and necessary in order to provide the required intensity of service to ensure the patient's safety. The patient's presenting symptoms, physical exam findings, and initial radiographic and laboratory data in the context of their chronic comorbidities is felt to place them at high risk for further clinical deterioration. Furthermore, it is not anticipated that the patient will be medically stable for discharge from the hospital within 2 midnights of admission.   * I certify that at the point of admission it is my clinical judgment that the patient will require inpatient hospital care spanning beyond 2 midnights from the point of admission due to high intensity of service, high risk for further deterioration and high frequency of surveillance  required.*   Further management decisions will depend on results of further testing and patient's response to treatment.   [facility]  [facility] [employer] on [email or web address]  11/01/2022, 5:28 PM

## 2022-11-01 NOTE — ED Provider Notes (Signed)
  Physical Exam  BP 97/67   Pulse (!) 58   Temp (!) 97.1 F (36.2 C) (Rectal)   Resp 13   Ht 5\' 5"  (1.651 m)   Wt 54.4 kg   SpO2 93%   BMI 19.97 kg/m   Physical Exam Vitals and nursing note reviewed.  Constitutional:      General: She is not in acute distress.    Appearance: She is well-developed. She is ill-appearing (Chronically). She is not toxic-appearing or diaphoretic.  HENT:     Head: Normocephalic and atraumatic.     Right Ear: External ear normal.     Left Ear: External ear normal.     Nose: Nose normal.     Mouth/Throat:     Mouth: Mucous membranes are moist.  Eyes:     Extraocular Movements: Extraocular movements intact.     Conjunctiva/sclera: Conjunctivae normal.  Cardiovascular:     Rate and Rhythm: Normal rate and regular rhythm.  Pulmonary:     Effort: Pulmonary effort is normal. No respiratory distress.  Abdominal:     General: There is no distension.     Palpations: Abdomen is soft.  Musculoskeletal:        General: No swelling.     Cervical back: Neck supple.     Comments: Chronic contractures  Skin:    General: Skin is warm and dry.     Comments: Cellulitic changes to right inguinal area with open wound in inguinal crease.  Small sacral wound present.  Neurological:     Mental Status: She is alert and oriented to person, place, and time. Mental status is at baseline.  Psychiatric:        Mood and Affect: Mood normal.        Behavior: Behavior normal.        Procedures  Procedures  ED Course / MDM    Medical Decision Making Amount and/or Complexity of Data Reviewed Labs: ordered. Radiology: ordered. ECG/medicine tests: ordered.  Risk Prescription drug management.   On initial assessment, patient is eating bread.  She denies any significant pain at this time.  CT scan shows no drainable fluid collections.  Per chart review, she was treated with 6 weeks of antibiotics for sacral osteomyelitis 1 year ago.  She was treated for right  inguinal cellulitis in December.  Patient reports that they placed her PICC line and started her on antibiotics 5 days ago.  Given her increasing white count and worsening cellulitic change, patient to be admitted for failure of initial antibiotic therapy.  She was admitted to medicine for further management.       Gloris Manchester, MD 11/01/22 [phone]

## 2022-11-01 NOTE — ED Triage Notes (Signed)
Pt arrives via EMS from [facility]. Pt states she has a wound that is infected in her right groin. EMS states the facility reported an elevated wbc. Pt AxOx4. Pt was recently started on ABX via a PICC line.

## 2022-11-01 NOTE — ED Provider Notes (Signed)
[place] EMERGENCY DEPARTMENT AT [facility] Provider Note   CSN: [id number] Arrival date & time: 11/01/22  1146     History  Chief Complaint  Patient presents with   Wound Infection    [name] [name] is a 62 y.o. female.  HPI   Had labs yesterday, on ertapenem and cipro for wound - WBC was 13,000 and BP was 80/40, sent in for this reason.  She has normmal mental staus according to the nurse I spoke with who knows her and took care of her this morning.  PICC was placed a week ago for administration of antibiotics.  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  Home Medications Prior to Admission medications   Medication Sig Start Date End Date Taking? Authorizing Provider  Amino Acids-Protein Hydrolys (PRO-STAT) LIQD Take 30 mLs by mouth 3 (three) times daily.    [provider]  ascorbic acid (VITAMIN C) 500 MG tablet Take 500 mg by mouth in the morning. (0900)    [provider]  aspirin 81 MG chewable tablet Chew 81 mg by mouth every evening. (1900)    [provider]  Calcium Carb-Cholecalciferol (CALCIUM 600-D) 600-10 MG-MCG TABS Take 1 tablet by mouth in the morning. (0900)    [provider]  cefadroxil (DURICEF) 500 MG capsule Take 1 capsule (500 mg total) by mouth 2 (two) times daily. 06/06/22   Danford, Earl Lites, MD  cholecalciferol (VITAMIN D3) 25 MCG (1000 UNIT) tablet Take 1,000 Units by mouth in the morning. (0800)    [provider]  diclofenac Sodium (VOLTAREN) 1 % GEL Apply 4 g topically every 6 (six) hours as needed (pain). To right hip    [provider]  FLUoxetine (PROZAC) 40 MG capsule Take 40 mg by mouth in the morning. (0800)    [provider]  gabapentin (NEURONTIN) 600 MG tablet Take 600 mg by mouth every 8  (eight) hours. (0600, 1400, 2200)    [provider]  ibuprofen (ADVIL) 400 MG tablet Take 800 mg by mouth every 6 (six) hours as needed (pain).    [provider]  LACTOBACILLUS PO Take 1 capsule by mouth 2 (two) times daily. (0800, 2000)    [provider]  loperamide (IMODIUM) 2 MG capsule Take 2 mg by mouth every 6 (six) hours as needed for diarrhea or loose stools.    [provider]  magnesium oxide (MAG-OX) 400 (240 Mg) MG tablet Take 400 mg by mouth in the morning. (0800)    [provider]  melatonin 3 MG TABS tablet Take 3 mg by mouth at bedtime. (2100)    [provider]  mirtazapine (REMERON) 7.5 MG tablet Take 7.5 mg by mouth at bedtime. (2100)    [provider]  Nutritional Supplements (NUTRITIONAL DRINK PO) Take 1 each by mouth 3 (three) times daily. House 2.0 (supplement)    [provider]  OXYGEN Inhale 2 L into the lungs as needed (oxygen level < 90% on room air).    [provider]  Propylene Glycol (SYSTANE COMPLETE) 0.6 % SOLN Place 1 drop into both eyes 2 (two) times daily. (0800, 2000)    [provider]  senna-docusate (SENOKOT-S) 8.6-50 MG tablet Take 2 tablets by mouth See admin instructions. 2 entries on MAR: 2 tablets  twice daily (0900, 1700) + "give 2 tablets by mouth as needed for constipation, give 1 tablet by mouth"    [provider]  tiZANidine (ZANAFLEX) 4 MG tablet Take 4 mg by mouth 3 (three) times daily.    [provider]  traMADol (ULTRAM) 50 MG tablet Take 1 tablet (50 mg total) by mouth See admin instructions. 2 entries on MAR : 50 mg every morning 30 minutes prior to dressing changes + 50 mg every 6 hours as needed for moderate/severe pain 06/06/22   Danford, Earl Lites, MD  Zinc Oxide 10 % OINT Apply 1 application  topically 2 (two) times daily. (0900, 2100)    [provider]      Allergies    Ativan [lorazepam], Bactrim  [sulfamethoxazole-trimethoprim], and Paxil [paroxetine hcl]    Review of Systems   Review of Systems  All other systems reviewed and are negative.   Physical Exam Updated Vital Signs BP 123/62   Pulse (!) 53   Temp (!) 97.1 F (36.2 C) (Rectal)   Resp 12   Ht 1.651 m (5\' 5" )   Wt 54.4 kg   SpO2 95%   BMI 19.97 kg/m  Physical Exam Vitals and nursing note reviewed.  Constitutional:      General: She is not in acute distress.    Appearance: She is well-developed.  HENT:     Head: Normocephalic and atraumatic.     Mouth/Throat:     Pharynx: No oropharyngeal exudate.  Eyes:     General: No scleral icterus.       Right eye: No discharge.        Left eye: No discharge.     Conjunctiva/sclera: Conjunctivae normal.     Pupils: Pupils are equal, round, and reactive to light.  Neck:     Thyroid: No thyromegaly.     Vascular: No JVD.  Cardiovascular:     Rate and Rhythm: Normal rate and regular rhythm.     Heart sounds: Normal heart sounds. No murmur heard.    No friction rub. No gallop.  Pulmonary:     Effort: Pulmonary effort is normal. No respiratory distress.     Breath sounds: Normal breath sounds. No wheezing or rales.  Abdominal:     General: Bowel sounds are normal. There is no distension.     Palpations: Abdomen is soft. There is no mass.     Tenderness: There is no abdominal tenderness.  Musculoskeletal:        General: No tenderness.     Cervical back: Normal range of motion and neck supple.     Comments: Unilateral upper extremity supple, has some contractures of the other arm and the bilateral legs are severely contractured and flexed positions  Lymphadenopathy:     Cervical: No cervical adenopathy.  Skin:    General: Skin is warm and dry.     Findings: Erythema and rash present.     Comments: Redness and moistness and drainage from the right inguinal region, there is some areas of purulence present  Neurological:     Mental Status: She is alert.      Coordination: Coordination normal.     Comments: Baseline neurologic deficits with unilateral weakness, mental status is awake and alert and answering questions appropriately  Psychiatric:        Behavior: Behavior normal.     ED Results / Procedures / Treatments   Labs (all labs ordered are listed, but only abnormal results are displayed)  Labs Reviewed - No data to display  EKG None  Radiology No results found.  Procedures Procedures    Medications Ordered in ED Medications - No data to display  ED Course/ Medical Decision Making/ A&P                             Medical Decision Making Amount and/or Complexity of Data Reviewed Labs: ordered. Radiology: ordered. ECG/medicine tests: ordered.  Risk Prescription drug management.    This patient presents to the ED for concern of increasing redness and likely infection, this involves an extensive number of treatment options, and is a complaint that carries with it a high risk of complications and morbidity.  The differential diagnosis includes abscess, osteomyelitis, worsening resistant cellulitis   Co morbidities that complicate the patient evaluation  Nursing home Chronic wound infections Chronic contractions   Additional history obtained:  Additional history obtained from medical record and discussion with the nurse at the nursing facility External records from outside source obtained and reviewed including prior admissions and treatment plan from December admission   Lab Tests:  I Ordered, and personally interpreted labs.  The pertinent results include: Leukocytosis, urinalysis without significant findings of infection, negative for COVID or flu, creatinine is at baseline, electrolytes normal, CBC with leukocytosis but no anemia.   Imaging Studies ordered:  I ordered imaging studies including chest x-ray with interstitial opacities, mild I independently visualized and interpreted imaging which showed no  other acute findings CT scan of the abdomen and pelvis ordered and pending at change of shift I agree with the radiologist interpretation   Cardiac Monitoring: / EKG:  The patient was maintained on a cardiac monitor.  I personally viewed and interpreted the cardiac monitored which showed an underlying rhythm of: Sinus rhythm   Consultations Obtained:  At change of shift care signed out to Dr. Edilia Bo to follow-up results of CT scan and disposition accordingly   Problem List / ED Course / Critical interventions / Medication management  This patient has what appears to be some brewing infection in the right inguinal region and skin.  There is a leukocytosis but no fever, borderline hypotension, antibiotics have been ordered, the patient is alert and awake.  CT scan may show deeper tissue infection or bony infection, does not appear to have perineal involvement clinically on my exam with a chaperone present.  At the time of change of shift CT scan is pending, Dr. Durwin Nora to arrange disposition once results are back.  Social Determinants of Health:  NH patient   Test / Admission - Considered:  Will need admission most likely Already on Ertapenem and Cipro Has Picc line in LUE.         Final Clinical Impression(s) / ED Diagnoses Final diagnoses:  None    Rx / DC Orders ED Discharge Orders     None         Eber Hong, MD 11/01/22 1521

## 2022-11-01 NOTE — Progress Notes (Signed)
Elink is following code sepsis 

## 2022-11-02 DIAGNOSIS — I959 Hypotension, unspecified: Secondary | ICD-10-CM | POA: Diagnosis not present

## 2022-11-02 DIAGNOSIS — L03314 Cellulitis of groin: Secondary | ICD-10-CM | POA: Diagnosis not present

## 2022-11-02 LAB — CBC
HCT: 37.8 % (ref 36.0–46.0)
Hemoglobin: 12.2 g/dL (ref 12.0–15.0)
MCH: 28.6 pg (ref 26.0–34.0)
MCHC: 32.3 g/dL (ref 30.0–36.0)
MCV: 88.5 fL (ref 80.0–100.0)
Platelets: 325 10*3/uL (ref 150–400)
RBC: 4.27 MIL/uL (ref 3.87–5.11)
RDW: 14.7 % (ref 11.5–15.5)
WBC: 11.7 10*3/uL — ABNORMAL HIGH (ref 4.0–10.5)
nRBC: 0 % (ref 0.0–0.2)

## 2022-11-02 LAB — BASIC METABOLIC PANEL
Anion gap: 8 (ref 5–15)
BUN: 20 mg/dL (ref 6–20)
CO2: 25 mmol/L (ref 22–32)
Calcium: 8.5 mg/dL — ABNORMAL LOW (ref 8.9–10.3)
Chloride: 105 mmol/L (ref 98–111)
Creatinine, Ser: <0.3 mg/dL — ABNORMAL LOW (ref 0.44–1.00)
Glucose, Bld: 94 mg/dL (ref 70–99)
Potassium: 3.6 mmol/L (ref 3.5–5.1)
Sodium: 138 mmol/L (ref 135–145)

## 2022-11-02 LAB — PROCALCITONIN
Procalcitonin: <0.1 ng/mL
Procalcitonin: <0.1 ng/mL

## 2022-11-02 LAB — CULTURE, BLOOD (ROUTINE X 2): Special Requests: ADEQUATE

## 2022-11-02 MED ORDER — OXYCODONE HCL 5 MG PO TABS
5.0000 mg | ORAL_TABLET | Freq: Once | ORAL | Status: AC
  Administered 2022-11-02: 5 mg via ORAL
  Filled 2022-11-02: qty 1

## 2022-11-02 MED ORDER — CHLORHEXIDINE GLUCONATE CLOTH 2 % EX PADS
6.0000 | MEDICATED_PAD | Freq: Every day | CUTANEOUS | Status: DC
  Administered 2022-11-02 – 2022-11-06 (×5): 6 via TOPICAL

## 2022-11-02 NOTE — Progress Notes (Signed)
[facility] PROGRESS NOTE   [name] [name] ZOX:[id number] DOB: 22-Feb-1961 DOA: 11/01/2022  PCP: Pcp, No  Brief History/Interval Summary: 62 y.o. 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 About a week ago the staff at the nursing facility noticed that her right groin area was looking more erythematous than usual.  She has a history of infection in that area and was hospitalized in December 2023 and was treated with IV antibiotics at that time.  It looks like a PICC line was placed at the skilled nursing facility a few days ago. Patient was started on ertapenem and ciprofloxacin.  Despite this her erythema did not improve and on the morning of admission she was noted to have low blood pressures and blood work showed worsening in her WBC.  She was sent over to the emergency department.  CT scan did not show any deep infection or abscess.  She was hospitalized for further management.    Consultants: None  Procedures: None    Subjective/Interval History: Complains of pain in the right hip area this morning.  Denies any chest pain shortness of breath nausea or vomiting.    Assessment/Plan:  Cellulitis of the right groin She failed outpatient treatment with ertapenem and ciprofloxacin.  PICC line is in place. Started on vancomycin and cefepime.  Procalcitonin level less than 0.1.  WBC is better today.  CT scan did not show any obvious deep infection or abscess. Continue broad-spectrum antibiotics for now.  Follow-up on blood cultures. Wound care is also following.  Hypotension Patient has chronically low blood pressures at baseline.  Lactic acid level was normal.  Cortisol level is normal.  She was given fluid bolus.  Blood  pressures have improved.  Continue to monitor. She is not on any antihypertensives prior to admission.  Sacral decubitus with chronic osteomyelitis Does not appear to be an active issue.  Wound care.  Previous history of stroke with lower extremity contractures Stable.  Continue aspirin.  History of depression Continue with SSRI.   DVT Prophylaxis: Lovenox Code Status: DNR Family Communication: Discussed with patient Disposition Plan: Hopefully back to SNF when improved  Status is: Inpatient Remains inpatient appropriate because: Right groin cellulitis requiring IV antibiotics   Medications: Scheduled:  aspirin  81 mg Oral QPM   Chlorhexidine Gluconate Cloth  6 each Topical Daily   enoxaparin (LOVENOX) injection  40 mg Subcutaneous Q24H   FLUoxetine  40 mg Oral q AM   magnesium oxide  400 mg Oral q AM   melatonin  3 mg Oral QHS   mirtazapine  7.5 mg Oral QHS   polyvinyl alcohol  1 drop Both Eyes BID   senna-docusate  2 tablet Oral BID   tiZANidine  4 mg Oral TID   Continuous:  ceFEPime (MAXIPIME) IV 200 mL/hr at 11/02/22 0600   vancomycin Stopped (11/02/22 0230)   WJX:BJYNWGNFAOZHY **OR** acetaminophen, ondansetron **OR** ondansetron (ZOFRAN) IV, oxyCODONE  Antibiotics: Anti-infectives (From admission, onward)    Start     Dose/Rate Route Frequency Ordered Stop   11/02/22 0000  vancomycin (VANCOREADY) IVPB 1250 mg/250 mL        1,250 mg 166.7 mL/hr over 90 Minutes Intravenous Every 12 hours 11/01/22 1425     11/01/22 2100  ceFEPIme (MAXIPIME) 2 g in sodium chloride 0.9 % 100 mL IVPB        2 g 200 mL/hr over 30 Minutes Intravenous Every 8 hours 11/01/22 1425     11/01/22 1230  ceFEPIme (MAXIPIME) 2 g in sodium chloride 0.9 % 100 mL IVPB        2 g 200 mL/hr over 30 Minutes Intravenous  Once 11/01/22 1218 11/01/22 1438   11/01/22 1230  metroNIDAZOLE (FLAGYL) IVPB 500 mg        500 mg 100 mL/hr over 60 Minutes Intravenous  Once 11/01/22 1218 11/01/22 1632    11/01/22 1230  vancomycin (VANCOCIN) IVPB 1000 mg/200 mL premix        1,000 mg 200 mL/hr over 60 Minutes Intravenous  Once 11/01/22 1218 11/01/22 1734       Objective:  Vital Signs  Vitals:   11/01/22 1940 11/02/22 0055 11/02/22 0504 11/02/22 0727  BP: 98/72 120/67 123/68 129/62  Pulse: 68 88 88 100  Resp: 18 18 18 18   Temp: 98.7 F (37.1 C) 98.6 F (37 C) 98.6 F (37 C) 98.9 F (37.2 C)  TempSrc: Oral Oral Oral Oral  SpO2: 95% 91% 94% (!) 89%  Weight:  58.1 kg    Height:        Intake/Output Summary (Last 24 hours) at 11/02/2022 0916 Last data filed at 11/02/2022 0600 Gross per 24 hour  Intake 1960.58 ml  Output --  Net 1960.58 ml   Filed Weights   11/01/22 1157 11/02/22 0055  Weight: 54.4 kg 58.1 kg    General appearance: Awake alert.  In no distress Resp: Clear to auscultation bilaterally.  Normal effort Cardio: S1-S2 is normal regular.  No S3-S4.  No rubs murmurs or bruit GI: Abdomen is soft.  Nontender nondistended.  Bowel sounds are present normal.  No masses organomegaly Extremities: Contracted lower extremities make examination very difficult.  Erythema identified in the right groin area.  No obvious swelling over the right hip area.   Lab Results:  Data Reviewed: I have personally reviewed following labs and reports of the imaging studies  CBC: Recent Labs  Lab 11/01/22 1234 11/02/22 0254  WBC 15.2* 11.7*  NEUTROABS 11.5*  --   HGB 12.0 12.2  HCT 38.7 37.8  MCV 92.6 88.5  PLT 341 325    Basic Metabolic Panel: Recent Labs  Lab 11/01/22 1234 11/02/22 0254  NA 143 138  K 3.7 3.6  CL 106 105  CO2 28 25  GLUCOSE 133* 94  BUN 28* 20  CREATININE 0.32* <0.30*  CALCIUM 9.0 8.5*    GFR: CrCl cannot be calculated (This lab value cannot be used to calculate CrCl because it is not a number: <0.30).  Liver Function Tests: Recent Labs  Lab 11/01/22 1234  AST 16  ALT 13  ALKPHOS 80  BILITOT 0.4  PROT 5.9*  ALBUMIN 2.8*      Coagulation Profile: Recent Labs  Lab 11/01/22 1234  INR 1.1     Recent Results (from the past 240 hour(s))  Blood Culture (routine x 2)     Status: None (Preliminary result)   Collection Time: 11/01/22 12:23 PM   Specimen: BLOOD  Result Value Ref Range Status   Specimen Description BLOOD PICC LINE  Final   Special Requests   Final    BOTTLES DRAWN AEROBIC AND ANAEROBIC Blood Culture results may not be optimal due to an excessive volume of blood received in culture bottles   Culture  Final    NO GROWTH < 24 HOURS Performed at [facility], [address], [place], [place] [address]    Report Status PENDING  Incomplete  Resp panel by RT-PCR (RSV, Flu A&B, Covid) Anterior Nasal Swab     Status: None   Collection Time: 11/01/22  1:41 PM   Specimen: Anterior Nasal Swab  Result Value Ref Range Status   SARS Coronavirus 2 by RT PCR NEGATIVE NEGATIVE Final   Influenza A by PCR NEGATIVE NEGATIVE Final   Influenza B by PCR NEGATIVE NEGATIVE Final    Comment: (NOTE) The Xpert Xpress SARS-CoV-2/FLU/RSV plus assay is intended as an aid in the diagnosis of influenza from Nasopharyngeal swab specimens and should not be used as a sole basis for treatment. Nasal washings and aspirates are unacceptable for Xpert Xpress SARS-CoV-2/FLU/RSV testing.  Fact Sheet for Patients: BloggerCourse.com  Fact Sheet for Healthcare Providers: SeriousBroker.it  This test is not yet approved or cleared by the [place] FDA and has been authorized for detection and/or diagnosis of SARS-CoV-2 by FDA under an Emergency Use Authorization (EUA). This EUA will remain in effect (meaning this test can be used) for the duration of the COVID-19 declaration under Section 564(b)(1) of the Act, 21 U.S.C. section 360bbb-3(b)(1), unless the authorization is terminated or revoked.     Resp Syncytial Virus by PCR NEGATIVE NEGATIVE Final    Comment:  (NOTE) Fact Sheet for Patients: BloggerCourse.com  Fact Sheet for Healthcare Providers: SeriousBroker.it  This test is not yet approved or cleared by the [place] FDA and has been authorized for detection and/or diagnosis of SARS-CoV-2 by FDA under an Emergency Use Authorization (EUA). This EUA will remain in effect (meaning this test can be used) for the duration of the COVID-19 declaration under Section 564(b)(1) of the Act, 21 U.S.C. section 360bbb-3(b)(1), unless the authorization is terminated or revoked.  Performed at [facility], [address], [place], [place] [address]   Blood Culture (routine x 2)     Status: None (Preliminary result)   Collection Time: 11/01/22  3:33 PM   Specimen: BLOOD LEFT FOREARM  Result Value Ref Range Status   Specimen Description BLOOD LEFT FOREARM  Final   Special Requests   Final    BOTTLES DRAWN AEROBIC AND ANAEROBIC Blood Culture adequate volume   Culture   Final    NO GROWTH < 24 HOURS Performed at [facility], [address], [place], [place] [address]    Report Status PENDING  Incomplete      Radiology Studies: CT ABDOMEN PELVIS W CONTRAST  Result Date: 11/01/2022 CLINICAL DATA:  Abdominal, postoperative, history of sacral osteomyelitis EXAM: CT ABDOMEN AND PELVIS WITH CONTRAST TECHNIQUE: Multidetector CT imaging of the abdomen and pelvis was performed using the standard protocol following bolus administration of intravenous contrast. RADIATION DOSE REDUCTION: This exam was performed according to the departmental dose-optimization program which includes automated exposure control, adjustment of the mA and/or kV according to patient size and/or use of iterative reconstruction technique. CONTRAST:  75mL OMNIPAQUE IOHEXOL 350 MG/ML SOLN COMPARISON:  06/03/2022 FINDINGS: Lower chest: Dependent bibasilar atelectasis or consolidation, more conspicuous on the left (series 4,  image 8). Coronary artery calcifications. Hepatobiliary: No solid liver abnormality is seen. No gallstones, gallbladder wall thickening, or biliary dilatation. Pancreas: Unremarkable. No pancreatic ductal dilatation or surrounding inflammatory changes. Spleen: Normal in size without significant abnormality. Adrenals/Urinary Tract: Adrenal glands are unremarkable. Simple, benign right renal cortical cysts, for which  no further follow-up or characterization is required. Small nonobstructive calculus of the superior pole of the right kidney. No left-sided calculi, ureteral calculi, or hydronephrosis. Bladder is normal. Stomach/Bowel: Diverticulum of the fundus (series 3, image 22). Stomach is otherwise normal. Appendix appears normal. No evidence of bowel wall thickening, distention, or inflammatory changes. Large burden of stool and stool balls throughout the colon and rectum. Vascular/Lymphatic: Aortic atherosclerosis. No enlarged abdominal or pelvic lymph nodes. Reproductive: No mass or other significant abnormality. Other: No abdominal wall hernia or abnormality. No ascites. Musculoskeletal: Osteopenia. New, although age indeterminate superior endplate wedge deformity of L4 (series 7, image 94). Status post bilateral hip arthroplasty. Severe sarcopenia. Decubitus ulceration of the lower sacrum and coccyx (series 3, image 71). Decubitus ulceration of the left ischium and hip (series 3, image 71). No fluid collection or other acute findings. IMPRESSION: 1. No acute CT findings of the abdomen or pelvis. 2. Decubitus ulceration of the lower sacrum and coccyx. Decubitus ulceration of the left ischium and hip. No fluid collection or other acute findings. MRI is the test of choice to assess for bone marrow edema and osteomyelitis if clinically suspected. 3. New, although age indeterminate superior endplate wedge deformity of L4. Correlate for acute point tenderness. 4. Nonobstructive right nephrolithiasis. 5. Dependent  bibasilar atelectasis or consolidation, more conspicuous on the left. 6. Coronary artery disease. Aortic Atherosclerosis (ICD10-I70.0). Electronically Signed   By: Jearld Lesch M.D.   On: 11/01/2022 16:15   DG Chest Port 1 View  Result Date: 11/01/2022 CLINICAL DATA:  Questionable sepsis EXAM: PORTABLE CHEST 1 VIEW COMPARISON:  CXR 11/21/17 FINDINGS: No pleural effusion. No pneumothorax. No focal airspace opacity. Normal cardiac and mediastinal contours. No focal airspace opacity. No radiographically apparent displaced rib fractures. There are prominent bilateral interstitial opacities which could represent atypical infection or pulmonary venous congestion. IMPRESSION: Prominent bilateral interstitial opacities could represent atypical infection or pulmonary venous congestion. Electronically Signed   By: Lorenza Cambridge M.D.   On: 11/01/2022 12:35       LOS: 1 day   Carmin Alvidrez  [facility] Pager on [email or web address]  11/02/2022, 9:16 AM

## 2022-11-02 NOTE — Consult Note (Signed)
WOC Nurse Consult Note: Reason for Consult:sacral pressure injury Noted to have cellulitis of the right groin. 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  Wound type: pressure, infectious Pressure Injury POA: Yes Measurement: see nursing flow sheets Wound bed: unable to assess from image of the right groin; sacrum-100% pink, clean, non granular  Drainage (amount, consistency, odor) see nursing flow sheets Periwound: intact  Dressing procedure/placement/frequency: Cleanse bilateral inguinal folds with soap and water and apply Interdry  Measure and cut length of InterDry to fit in skin folds that have skin breakdown (both inguinal folds)  Tuck InterDry fabric into skin folds in a single layer, allow for 2 inches of overhang from skin edges to allow for wicking to occur May remove to bathe; dry area thoroughly and then tuck into affected areas again Do not apply any creams or ointments when using InterDry DO NOT THROW AWAY FOR 5 DAYS unless soiled with stool DO NOT [facility] product, this will inactivate the silver in the material  New sheet of Interdry should be applied after 5 days of use if patient continues to have skin breakdown  Cleanse sacral wound with NS and pat dry. Apply aquacel to open wound. Top with sacral silicone foam. Change every other day and PRN soilage.  Low air loss mattress for moisture management and pressure redistribution  Consider re-evaluation in [facility]; due to chronic non healing sacral wound and what appears to be chronic area of the right groin   Discussed POC with patient and bedside nurse.  Re consult if needed, will not follow at this time. Thanks  Hassen Bruun [employer], RN,CWOCN, CNS, CWON-AP  [phone])

## 2022-11-02 NOTE — Progress Notes (Signed)
Patient has refused Interdry application as well as sacral wound assessment and dressing

## 2022-11-02 NOTE — Progress Notes (Signed)
This RN informed the overnight provider that patient had not urinated much overnight except for 1 unmeasured occurrence. I tried bladder scanning her, but due to her stroke, she is contracted from the legs, which does not give an accurate measurement of what's in her bladder. Per day shift nurse, they did an in-out cath in the ED and got out ~500. An in-out cath order was placed, however, patient denied me to try and do the procedure. Patient is alert and oriented, and I notified my charge RN about patient

## 2022-11-03 LAB — CBC
HCT: 32 % — ABNORMAL LOW (ref 36.0–46.0)
Hemoglobin: 10.5 g/dL — ABNORMAL LOW (ref 12.0–15.0)
MCH: 29.4 pg (ref 26.0–34.0)
MCHC: 32.8 g/dL (ref 30.0–36.0)
MCV: 89.6 fL (ref 80.0–100.0)
Platelets: 284 10*3/uL (ref 150–400)
RBC: 3.57 MIL/uL — ABNORMAL LOW (ref 3.87–5.11)
RDW: 14.6 % (ref 11.5–15.5)
WBC: 11 10*3/uL — ABNORMAL HIGH (ref 4.0–10.5)
nRBC: 0 % (ref 0.0–0.2)

## 2022-11-03 LAB — BASIC METABOLIC PANEL
Anion gap: 9 (ref 5–15)
BUN: 17 mg/dL (ref 6–20)
CO2: 22 mmol/L (ref 22–32)
Calcium: 8.4 mg/dL — ABNORMAL LOW (ref 8.9–10.3)
Chloride: 106 mmol/L (ref 98–111)
Creatinine, Ser: 0.35 mg/dL — ABNORMAL LOW (ref 0.44–1.00)
GFR, Estimated: >60 mL/min (ref >60)
Glucose, Bld: 84 mg/dL (ref 70–99)
Potassium: 3.6 mmol/L (ref 3.5–5.1)
Sodium: 137 mmol/L (ref 135–145)

## 2022-11-03 MED ORDER — POTASSIUM CHLORIDE 10 MEQ/100ML IV SOLN
10.0000 meq | INTRAVENOUS | Status: AC
  Administered 2022-11-03 (×3): 10 meq via INTRAVENOUS
  Filled 2022-11-03 (×3): qty 100

## 2022-11-03 MED ORDER — SODIUM CHLORIDE 0.9 % IV BOLUS
500.0000 mL | Freq: Once | INTRAVENOUS | Status: AC
  Administered 2022-11-03: 500 mL via INTRAVENOUS

## 2022-11-03 MED ORDER — POTASSIUM CHLORIDE CRYS ER 20 MEQ PO TBCR
40.0000 meq | EXTENDED_RELEASE_TABLET | Freq: Once | ORAL | Status: DC
  Filled 2022-11-03: qty 2

## 2022-11-03 MED ORDER — POTASSIUM CHLORIDE 10 MEQ/100ML IV SOLN
10.0000 meq | Freq: Once | INTRAVENOUS | Status: AC
  Administered 2022-11-03: 10 meq via INTRAVENOUS
  Filled 2022-11-03: qty 100

## 2022-11-03 NOTE — Progress Notes (Signed)
   11/03/22 1852  Vitals  BP (!) 106/53  MAP (mmHg) 66  BP Location Right Arm  BP Method Automatic  Patient Position (if appropriate) Lying  ECG Heart Rate 81  MEWS COLOR  MEWS Score Color Green  MEWS Score  MEWS Temp 0  MEWS Systolic 0  MEWS Pulse 0  MEWS RR 0  MEWS LOC 0  MEWS Score 0   RN obtained VS post bolus and post patient care.

## 2022-11-03 NOTE — Progress Notes (Signed)
   11/03/22 1615  Vitals  BP (!) 86/48  MAP (mmHg) (!) 60  Pulse Rate (!) 58  ECG Heart Rate 62  MEWS COLOR  MEWS Score Color Green  Oxygen Therapy  SpO2 93 %  MEWS Score  MEWS Temp 0  MEWS Systolic 1  MEWS Pulse 0  MEWS RR 0  MEWS LOC 0  MEWS Score 1    RN rounded to obtained 1600 VS. Patients SBP is trending 75-80. Patient is asymptomatic. Rito Ehrlich MD paged and notified. MD ordered bolus. RN initiated bolus.

## 2022-11-03 NOTE — Progress Notes (Signed)
[facility] PROGRESS NOTE   [name] [name] XBJ:[id number] DOB: [birth year] DOA: 11/01/2022  PCP: Pcp, No  Brief History/Interval Summary: 62 y.o. 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 About a week ago the staff at the nursing facility noticed that her right groin area was looking more erythematous than usual.  She has a history of infection in that area and was hospitalized in December 2023 and was treated with IV antibiotics at that time.  It looks like a PICC line was placed at the skilled nursing facility a few days ago. Patient was started on ertapenem and ciprofloxacin.  Despite this her erythema did not improve and on the morning of admission she was noted to have low blood pressures and blood work showed worsening in her WBC.  She was sent over to the emergency department.  CT scan did not show any deep infection or abscess.  She was hospitalized for further management.    Consultants: None  Procedures: None    Subjective/Interval History: Complains of pain in the right hip area.  No other complaints offered.    Assessment/Plan:  Cellulitis of the right groin She failed outpatient treatment with ertapenem and ciprofloxacin.  PICC line is in place. Started on vancomycin and cefepime.  Procalcitonin level less than 0.1.   WBC has improved.  She is afebrile.  Cultures without any growth so far.   CT scan did not show any obvious deep infection or abscess. Continue with vancomycin and cefepime for another 24 hours and then will plan to de-escalate tomorrow.   Erythema in the right groin appears to be improving. Wound care is also following. Pain in the right hip is likely due to infection and contractures and possibly  chronic.  CT scan did not show any acute musculoskeletal findings.  Hypotension Patient has chronically low blood pressures at baseline.  Lactic acid level was normal.  Cortisol level is normal.  She was given fluid bolus.  Blood pressures have improved.  Continue to monitor. She was not on any antihypertensives prior to admission.  Sacral decubitus with chronic osteomyelitis Does not appear to be an active issue.  Wound care.  Previous history of stroke with lower extremity contractures Stable.  Continue aspirin.  History of depression Continue with SSRI.   DVT Prophylaxis: Lovenox Code Status: DNR Family Communication: Discussed with patient Disposition Plan: Hopefully back to SNF when improved  Status is: Inpatient Remains inpatient appropriate because: Right groin cellulitis requiring IV antibiotics   Medications: Scheduled:  aspirin  81 mg Oral QPM   Chlorhexidine Gluconate Cloth  6 each Topical Daily   enoxaparin (LOVENOX) injection  40 mg Subcutaneous Q24H   FLUoxetine  40 mg Oral q AM   magnesium oxide  400 mg Oral q AM   melatonin  3 mg Oral QHS   mirtazapine  7.5 mg Oral QHS   polyvinyl alcohol  1 drop Both Eyes BID   senna-docusate  2 tablet Oral BID   tiZANidine  4 mg Oral TID   Continuous:  ceFEPime (MAXIPIME) IV 200 mL/hr at 11/03/22 0507   vancomycin Stopped (11/03/22 0043)   HYQ:MVHQIONGEXBMW **OR** acetaminophen, ondansetron **OR** ondansetron (ZOFRAN) IV, oxyCODONE  Antibiotics: Anti-infectives (From admission, onward)    Start  Dose/Rate Route Frequency Ordered Stop   11/02/22 0000  vancomycin (VANCOREADY) IVPB 1250 mg/250 mL        1,250 mg 166.7 mL/hr over 90 Minutes Intravenous Every 12 hours 11/01/22 1425     11/01/22 2100  ceFEPIme (MAXIPIME) 2 g in sodium chloride 0.9 % 100 mL IVPB        2 g 200 mL/hr over 30 Minutes Intravenous Every 8 hours 11/01/22 1425     11/01/22 1230  ceFEPIme (MAXIPIME) 2 g in sodium chloride 0.9 % 100 mL IVPB         2 g 200 mL/hr over 30 Minutes Intravenous  Once 11/01/22 1218 11/01/22 1438   11/01/22 1230  metroNIDAZOLE (FLAGYL) IVPB 500 mg        500 mg 100 mL/hr over 60 Minutes Intravenous  Once 11/01/22 1218 11/01/22 1632   11/01/22 1230  vancomycin (VANCOCIN) IVPB 1000 mg/200 mL premix        1,000 mg 200 mL/hr over 60 Minutes Intravenous  Once 11/01/22 1218 11/01/22 1734       Objective:  Vital Signs  Vitals:   11/02/22 1925 11/02/22 2314 11/03/22 0335 11/03/22 0748  BP: (!) 93/52 (!) 106/56 (!) 96/54 (!) 108/52  Pulse: (!) 55 (!) 51 (!) 56 82  Resp: 18 17 18 18   Temp: 98.4 F (36.9 C) 97.8 F (36.6 C) 98.4 F (36.9 C) 98.8 F (37.1 C)  TempSrc: Oral Oral Oral Oral  SpO2: 92% 93% 93% 93%  Weight:   63.5 kg   Height:        Intake/Output Summary (Last 24 hours) at 11/03/2022 0914 Last data filed at 11/03/2022 0507 Gross per 24 hour  Intake 780.71 ml  Output --  Net 780.71 ml    Filed Weights   11/01/22 1157 11/02/22 0055 11/03/22 0335  Weight: 54.4 kg 58.1 kg 63.5 kg    General appearance: Awake alert.  In no distress Resp: Clear to auscultation bilaterally.  Normal effort Cardio: S1-S2 is normal regular.  No S3-S4.  No rubs murmurs or bruit GI: Abdomen is soft.  Nontender nondistended.  Bowel sounds are present normal.  No masses organomegaly Extremities: Contracted lower extremity please make it difficult to examine the right groin area but erythema seems to be better.  Lab Results:  Data Reviewed: I have personally reviewed following labs and reports of the imaging studies  CBC: Recent Labs  Lab 11/01/22 1234 11/02/22 0254 11/03/22 0335  WBC 15.2* 11.7* 11.0*  NEUTROABS 11.5*  --   --   HGB 12.0 12.2 10.5*  HCT 38.7 37.8 32.0*  MCV 92.6 88.5 89.6  PLT 341 325 284     Basic Metabolic Panel: Recent Labs  Lab 11/01/22 1234 11/02/22 0254 11/03/22 0335  NA 143 138 137  K 3.7 3.6 3.6  CL 106 105 106  CO2 28 25 22   GLUCOSE 133* 94 84  BUN 28*  20 17  CREATININE 0.32* <0.30* 0.35*  CALCIUM 9.0 8.5* 8.4*     GFR: Estimated Creatinine Clearance: 67.3 mL/min (A) (by C-G formula based on SCr of 0.35 mg/dL (L)).  Liver Function Tests: Recent Labs  Lab 11/01/22 1234  AST 16  ALT 13  ALKPHOS 80  BILITOT 0.4  PROT 5.9*  ALBUMIN 2.8*      Coagulation Profile: Recent Labs  Lab 11/01/22 1234  INR 1.1      Recent Results (from the past 240 hour(s))  Blood Culture (routine x 2)  Status: None (Preliminary result)   Collection Time: 11/01/22 12:23 PM   Specimen: BLOOD  Result Value Ref Range Status   Specimen Description BLOOD PICC LINE  Final   Special Requests   Final    BOTTLES DRAWN AEROBIC AND ANAEROBIC Blood Culture results may not be optimal due to an excessive volume of blood received in culture bottles   Culture   Final    NO GROWTH 2 DAYS Performed at [facility], [address] [place], [place] [address]    Report Status PENDING  Incomplete  Resp panel by RT-PCR (RSV, Flu A&B, Covid) Anterior Nasal Swab     Status: None   Collection Time: 11/01/22  1:41 PM   Specimen: Anterior Nasal Swab  Result Value Ref Range Status   SARS Coronavirus 2 by RT PCR NEGATIVE NEGATIVE Final   Influenza A by PCR NEGATIVE NEGATIVE Final   Influenza B by PCR NEGATIVE NEGATIVE Final    Comment: (NOTE) The Xpert Xpress SARS-CoV-2/FLU/RSV plus assay is intended as an aid in the diagnosis of influenza from Nasopharyngeal swab specimens and should not be used as a sole basis for treatment. Nasal washings and aspirates are unacceptable for Xpert Xpress SARS-CoV-2/FLU/RSV testing.  Fact Sheet for Patients: BloggerCourse.com  Fact Sheet for Healthcare Providers: SeriousBroker.it  This test is not yet approved or cleared by the [place] FDA and has been authorized for detection and/or diagnosis of SARS-CoV-2 by FDA under an Emergency Use Authorization (EUA).  This EUA will remain in effect (meaning this test can be used) for the duration of the COVID-19 declaration under Section 564(b)(1) of the Act, 21 U.S.C. section 360bbb-3(b)(1), unless the authorization is terminated or revoked.     Resp Syncytial Virus by PCR NEGATIVE NEGATIVE Final    Comment: (NOTE) Fact Sheet for Patients: BloggerCourse.com  Fact Sheet for Healthcare Providers: SeriousBroker.it  This test is not yet approved or cleared by the [place] FDA and has been authorized for detection and/or diagnosis of SARS-CoV-2 by FDA under an Emergency Use Authorization (EUA). This EUA will remain in effect (meaning this test can be used) for the duration of the COVID-19 declaration under Section 564(b)(1) of the Act, 21 U.S.C. section 360bbb-3(b)(1), unless the authorization is terminated or revoked.  Performed at [facility], [address], [place], [place] [address]   Blood Culture (routine x 2)     Status: None (Preliminary result)   Collection Time: 11/01/22  3:33 PM   Specimen: BLOOD LEFT FOREARM  Result Value Ref Range Status   Specimen Description BLOOD LEFT FOREARM  Final   Special Requests   Final    BOTTLES DRAWN AEROBIC AND ANAEROBIC Blood Culture adequate volume   Culture   Final    NO GROWTH 2 DAYS Performed at [facility], [address], [place], [place] [address]    Report Status PENDING  Incomplete      Radiology Studies: CT ABDOMEN PELVIS W CONTRAST  Result Date: 11/01/2022 CLINICAL DATA:  Abdominal, postoperative, history of sacral osteomyelitis EXAM: CT ABDOMEN AND PELVIS WITH CONTRAST TECHNIQUE: Multidetector CT imaging of the abdomen and pelvis was performed using the standard protocol following bolus administration of intravenous contrast. RADIATION DOSE REDUCTION: This exam was performed according to the departmental dose-optimization program which includes automated exposure  control, adjustment of the mA and/or kV according to patient size and/or use of iterative reconstruction technique. CONTRAST:  75mL OMNIPAQUE IOHEXOL 350 MG/ML SOLN COMPARISON:  06/03/2022  FINDINGS: Lower chest: Dependent bibasilar atelectasis or consolidation, more conspicuous on the left (series 4, image 8). Coronary artery calcifications. Hepatobiliary: No solid liver abnormality is seen. No gallstones, gallbladder wall thickening, or biliary dilatation. Pancreas: Unremarkable. No pancreatic ductal dilatation or surrounding inflammatory changes. Spleen: Normal in size without significant abnormality. Adrenals/Urinary Tract: Adrenal glands are unremarkable. Simple, benign right renal cortical cysts, for which no further follow-up or characterization is required. Small nonobstructive calculus of the superior pole of the right kidney. No left-sided calculi, ureteral calculi, or hydronephrosis. Bladder is normal. Stomach/Bowel: Diverticulum of the fundus (series 3, image 22). Stomach is otherwise normal. Appendix appears normal. No evidence of bowel wall thickening, distention, or inflammatory changes. Large burden of stool and stool balls throughout the colon and rectum. Vascular/Lymphatic: Aortic atherosclerosis. No enlarged abdominal or pelvic lymph nodes. Reproductive: No mass or other significant abnormality. Other: No abdominal wall hernia or abnormality. No ascites. Musculoskeletal: Osteopenia. New, although age indeterminate superior endplate wedge deformity of L4 (series 7, image 94). Status post bilateral hip arthroplasty. Severe sarcopenia. Decubitus ulceration of the lower sacrum and coccyx (series 3, image 71). Decubitus ulceration of the left ischium and hip (series 3, image 71). No fluid collection or other acute findings. IMPRESSION: 1. No acute CT findings of the abdomen or pelvis. 2. Decubitus ulceration of the lower sacrum and coccyx. Decubitus ulceration of the left ischium and hip. No fluid  collection or other acute findings. MRI is the test of choice to assess for bone marrow edema and osteomyelitis if clinically suspected. 3. New, although age indeterminate superior endplate wedge deformity of L4. Correlate for acute point tenderness. 4. Nonobstructive right nephrolithiasis. 5. Dependent bibasilar atelectasis or consolidation, more conspicuous on the left. 6. Coronary artery disease. Aortic Atherosclerosis (ICD10-I70.0). Electronically Signed   By: Jearld Lesch M.D.   On: 11/01/2022 16:15   DG Chest Port 1 View  Result Date: 11/01/2022 CLINICAL DATA:  Questionable sepsis EXAM: PORTABLE CHEST 1 VIEW COMPARISON:  CXR 11/21/17 FINDINGS: No pleural effusion. No pneumothorax. No focal airspace opacity. Normal cardiac and mediastinal contours. No focal airspace opacity. No radiographically apparent displaced rib fractures. There are prominent bilateral interstitial opacities which could represent atypical infection or pulmonary venous congestion. IMPRESSION: Prominent bilateral interstitial opacities could represent atypical infection or pulmonary venous congestion. Electronically Signed   By: Lorenza Cambridge M.D.   On: 11/01/2022 12:35       LOS: 2 days   [name] [name] Rito Ehrlich  [facility] Pager on [email or web address]  11/03/2022, 9:14 AM

## 2022-11-04 LAB — CBC
HCT: 32.6 % — ABNORMAL LOW (ref 36.0–46.0)
Hemoglobin: 10.5 g/dL — ABNORMAL LOW (ref 12.0–15.0)
MCH: 28.6 pg (ref 26.0–34.0)
MCHC: 32.2 g/dL (ref 30.0–36.0)
MCV: 88.8 fL (ref 80.0–100.0)
Platelets: 299 10*3/uL (ref 150–400)
RBC: 3.67 MIL/uL — ABNORMAL LOW (ref 3.87–5.11)
RDW: 14.5 % (ref 11.5–15.5)
WBC: 11.3 10*3/uL — ABNORMAL HIGH (ref 4.0–10.5)
nRBC: 0 % (ref 0.0–0.2)

## 2022-11-04 LAB — BASIC METABOLIC PANEL
Anion gap: 9 (ref 5–15)
BUN: 11 mg/dL (ref 6–20)
CO2: 22 mmol/L (ref 22–32)
Calcium: 8.3 mg/dL — ABNORMAL LOW (ref 8.9–10.3)
Chloride: 105 mmol/L (ref 98–111)
Creatinine, Ser: 0.4 mg/dL — ABNORMAL LOW (ref 0.44–1.00)
GFR, Estimated: >60 mL/min (ref >60)
Glucose, Bld: 107 mg/dL — ABNORMAL HIGH (ref 70–99)
Potassium: 3.7 mmol/L (ref 3.5–5.1)
Sodium: 136 mmol/L (ref 135–145)

## 2022-11-04 LAB — CULTURE, BLOOD (ROUTINE X 2): Culture: NO GROWTH

## 2022-11-04 MED ORDER — CEFADROXIL 500 MG PO CAPS
500.0000 mg | ORAL_CAPSULE | Freq: Two times a day (BID) | ORAL | Status: DC
  Administered 2022-11-04 – 2022-11-06 (×5): 500 mg via ORAL
  Filled 2022-11-04 (×6): qty 1

## 2022-11-04 MED ORDER — MIDODRINE HCL 5 MG PO TABS
5.0000 mg | ORAL_TABLET | Freq: Three times a day (TID) | ORAL | Status: DC
  Administered 2022-11-04 – 2022-11-05 (×3): 5 mg via ORAL
  Filled 2022-11-04 (×3): qty 1

## 2022-11-04 MED ORDER — LACTATED RINGERS IV BOLUS
1000.0000 mL | Freq: Once | INTRAVENOUS | Status: AC
  Administered 2022-11-04: 1000 mL via INTRAVENOUS

## 2022-11-04 NOTE — Progress Notes (Signed)
[facility] PROGRESS NOTE   [name] [name] UJW:[id number] DOB: 11/30/1960 DOA: 11/01/2022  PCP: Pcp, No  Brief History/Interval Summary: 61 y.o. 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 About a week ago the staff at the nursing facility noticed that her right groin area was looking more erythematous than usual.  She has a history of infection in that area and was hospitalized in December 2023 and was treated with IV antibiotics at that time.  It looks like a PICC line was placed at the skilled nursing facility a few days ago. Patient was started on ertapenem and ciprofloxacin.  Despite this her erythema did not improve and on the morning of admission she was noted to have low blood pressures and blood work showed worsening in her WBC.  She was sent over to the emergency department.  CT scan did not show any deep infection or abscess.  She was hospitalized for further management.    Consultants: None  Procedures: None    Subjective/Interval History: No new complaints.  Continues to have discomfort in the right hip area which appears to be chronic.    Assessment/Plan:  Cellulitis of the right groin She failed outpatient treatment with ertapenem and ciprofloxacin.  PICC line is in place. Started on vancomycin and cefepime.  Procalcitonin level less than 0.1.   WBC has improved.  She is afebrile.  Cultures without any growth so far.   CT scan did not show any obvious deep infection or abscess. Transition to oral antibiotics. She did well when she was discharged on cefadroxil when she was hospitalized for the same in December 2023.  Will transition to same antibiotic this time.  Monitor for 24 to 48 hours and then if she remains  stable she can be discharged back to SNF.  Hypotension Patient has chronically low blood pressures at baseline.  Lactic acid level was normal.  Cortisol level is normal.  She was given fluid bolus.  Pressures improved.  Continues to have occasional low blood pressures.  Will start midodrine.   Sacral decubitus with chronic osteomyelitis Does not appear to be an active issue.  Wound care.  Previous history of stroke with lower extremity contractures Stable.  Continue aspirin.  History of depression Continue with SSRI.  Normocytic anemia Drop in hemoglobin is dilutional.  No evidence of overt bleeding.  DVT Prophylaxis: Lovenox Code Status: DNR Family Communication: Discussed with patient Disposition Plan: Hopefully back to SNF when improved  Status is: Inpatient Remains inpatient appropriate because: Right groin cellulitis requiring IV antibiotics   Medications: Scheduled:  aspirin  81 mg Oral QPM   Chlorhexidine Gluconate Cloth  6 each Topical Daily   enoxaparin (LOVENOX) injection  40 mg Subcutaneous Q24H   FLUoxetine  40 mg Oral q AM   magnesium oxide  400 mg Oral q AM   melatonin  3 mg Oral QHS   midodrine  5 mg Oral TID WC   mirtazapine  7.5 mg Oral QHS   polyvinyl alcohol  1 drop Both Eyes BID   senna-docusate  2 tablet Oral BID   tiZANidine  4 mg Oral TID   Continuous:  ceFEPime (MAXIPIME) IV 200 mL/hr at 11/04/22 0526   vancomycin Stopped (11/04/22 0037)   FAO:ZHYQMVHQIONGE **  OR** acetaminophen, ondansetron **OR** ondansetron (ZOFRAN) IV, oxyCODONE  Antibiotics: Anti-infectives (From admission, onward)    Start     Dose/Rate Route Frequency Ordered Stop   11/02/22 0000  vancomycin (VANCOREADY) IVPB 1250 mg/250 mL        1,250 mg 166.7 mL/hr over 90 Minutes Intravenous Every 12 hours 11/01/22 1425     11/01/22 2100  ceFEPIme (MAXIPIME) 2 g in sodium chloride 0.9 % 100 mL IVPB        2 g 200 mL/hr over 30 Minutes Intravenous Every 8 hours 11/01/22 1425      11/01/22 1230  ceFEPIme (MAXIPIME) 2 g in sodium chloride 0.9 % 100 mL IVPB        2 g 200 mL/hr over 30 Minutes Intravenous  Once 11/01/22 1218 11/01/22 1438   11/01/22 1230  metroNIDAZOLE (FLAGYL) IVPB 500 mg        500 mg 100 mL/hr over 60 Minutes Intravenous  Once 11/01/22 1218 11/01/22 1632   11/01/22 1230  vancomycin (VANCOCIN) IVPB 1000 mg/200 mL premix        1,000 mg 200 mL/hr over 60 Minutes Intravenous  Once 11/01/22 1218 11/01/22 1734       Objective:  Vital Signs  Vitals:   11/04/22 0000 11/04/22 0400 11/04/22 0600 11/04/22 0800  BP: (!) 88/52 (!) 98/49 (!) 111/54 (!) 123/59  Pulse:      Resp:  17  16  Temp:  98.6 F (37 C)  99.1 F (37.3 C)  TempSrc:  Oral  Oral  SpO2:  98%  98%  Weight:  59.9 kg    Height:        Intake/Output Summary (Last 24 hours) at 11/04/2022 1016 Last data filed at 11/04/2022 0526 Gross per 24 hour  Intake 1906.77 ml  Output 2 ml  Net 1904.77 ml    Filed Weights   11/02/22 0055 11/03/22 0335 11/04/22 0400  Weight: 58.1 kg 63.5 kg 59.9 kg    General appearance: Awake alert.  In no distress Resp: Clear to auscultation bilaterally.  Normal effort Cardio: S1-S2 is normal regular.  No S3-S4.  No rubs murmurs or bruit GI: Abdomen is soft.  Nontender nondistended.  Bowel sounds are present normal.  No masses organomegaly Difficult to examine the groin due to her contracted lower extremities.  However it appears that erythema is better.   Lab Results:  Data Reviewed: I have personally reviewed following labs and reports of the imaging studies  CBC: Recent Labs  Lab 11/01/22 1234 11/02/22 0254 11/03/22 0335 11/04/22 0122  WBC 15.2* 11.7* 11.0* 11.3*  NEUTROABS 11.5*  --   --   --   HGB 12.0 12.2 10.5* 10.5*  HCT 38.7 37.8 32.0* 32.6*  MCV 92.6 88.5 89.6 88.8  PLT 341 325 284 299     Basic Metabolic Panel: Recent Labs  Lab 11/01/22 1234 11/02/22 0254 11/03/22 0335 11/04/22 0122  NA 143 138 137 136  K 3.7 3.6 3.6  3.7  CL 106 105 106 105  CO2 28 25 22 22   GLUCOSE 133* 94 84 107*  BUN 28* 20 17 11   CREATININE 0.32* <0.30* 0.35* 0.40*  CALCIUM 9.0 8.5* 8.4* 8.3*     GFR: Estimated Creatinine Clearance: 67.3 mL/min (A) (by C-G formula based on SCr of 0.4 mg/dL (L)).  Liver Function Tests: Recent Labs  Lab 11/01/22 1234  AST 16  ALT 13  ALKPHOS 80  BILITOT 0.4  PROT 5.9*  ALBUMIN 2.8*  Coagulation Profile: Recent Labs  Lab 11/01/22 1234  INR 1.1      Recent Results (from the past 240 hour(s))  Blood Culture (routine x 2)     Status: None (Preliminary result)   Collection Time: 11/01/22 12:23 PM   Specimen: BLOOD  Result Value Ref Range Status   Specimen Description BLOOD PICC LINE  Final   Special Requests   Final    BOTTLES DRAWN AEROBIC AND ANAEROBIC Blood Culture results may not be optimal due to an excessive volume of blood received in culture bottles   Culture   Final    NO GROWTH 2 DAYS Performed at [facility], [address], [place], [place] [address]    Report Status PENDING  Incomplete  Resp panel by RT-PCR (RSV, Flu A&B, Covid) Anterior Nasal Swab     Status: None   Collection Time: 11/01/22  1:41 PM   Specimen: Anterior Nasal Swab  Result Value Ref Range Status   SARS Coronavirus 2 by RT PCR NEGATIVE NEGATIVE Final   Influenza A by PCR NEGATIVE NEGATIVE Final   Influenza B by PCR NEGATIVE NEGATIVE Final    Comment: (NOTE) The Xpert Xpress SARS-CoV-2/FLU/RSV plus assay is intended as an aid in the diagnosis of influenza from Nasopharyngeal swab specimens and should not be used as a sole basis for treatment. Nasal washings and aspirates are unacceptable for Xpert Xpress SARS-CoV-2/FLU/RSV testing.  Fact Sheet for Patients: BloggerCourse.com  Fact Sheet for Healthcare Providers: SeriousBroker.it  This test is not yet approved or cleared by the [place] FDA and has been authorized for  detection and/or diagnosis of SARS-CoV-2 by FDA under an Emergency Use Authorization (EUA). This EUA will remain in effect (meaning this test can be used) for the duration of the COVID-19 declaration under Section 564(b)(1) of the Act, 21 U.S.C. section 360bbb-3(b)(1), unless the authorization is terminated or revoked.     Resp Syncytial Virus by PCR NEGATIVE NEGATIVE Final    Comment: (NOTE) Fact Sheet for Patients: BloggerCourse.com  Fact Sheet for Healthcare Providers: SeriousBroker.it  This test is not yet approved or cleared by the [place] FDA and has been authorized for detection and/or diagnosis of SARS-CoV-2 by FDA under an Emergency Use Authorization (EUA). This EUA will remain in effect (meaning this test can be used) for the duration of the COVID-19 declaration under Section 564(b)(1) of the Act, 21 U.S.C. section 360bbb-3(b)(1), unless the authorization is terminated or revoked.  Performed at [facility], [address] [place], [place] [address]   Blood Culture (routine x 2)     Status: None (Preliminary result)   Collection Time: 11/01/22  3:33 PM   Specimen: BLOOD LEFT FOREARM  Result Value Ref Range Status   Specimen Description BLOOD LEFT FOREARM  Final   Special Requests   Final    BOTTLES DRAWN AEROBIC AND ANAEROBIC Blood Culture adequate volume   Culture   Final    NO GROWTH 2 DAYS Performed at [facility], [address], [place], [place] [address]    Report Status PENDING  Incomplete      Radiology Studies: No results found.     LOS: 3 days   [place] [employer] on [email or web address]  11/04/2022, 10:16 AM

## 2022-11-05 LAB — BASIC METABOLIC PANEL
Anion gap: 7 (ref 5–15)
BUN: 8 mg/dL (ref 6–20)
CO2: 25 mmol/L (ref 22–32)
Calcium: 8.5 mg/dL — ABNORMAL LOW (ref 8.9–10.3)
Chloride: 105 mmol/L (ref 98–111)
Creatinine, Ser: 0.3 mg/dL — ABNORMAL LOW (ref 0.44–1.00)
GFR, Estimated: >60 mL/min (ref >60)
Glucose, Bld: 96 mg/dL (ref 70–99)
Potassium: 3.6 mmol/L (ref 3.5–5.1)
Sodium: 137 mmol/L (ref 135–145)

## 2022-11-05 LAB — FOLATE: Folate: 8.4 ng/mL

## 2022-11-05 LAB — RETICULOCYTES
Immature Retic Fract: 20.3 % — ABNORMAL HIGH (ref 2.3–15.9)
RBC.: 3.93 MIL/uL (ref 3.87–5.11)
Retic Count, Absolute: 50.3 K/uL (ref 19.0–186.0)
Retic Ct Pct: 1.3 % (ref 0.4–3.1)

## 2022-11-05 LAB — CBC
HCT: 34.4 % — ABNORMAL LOW (ref 36.0–46.0)
Hemoglobin: 11.2 g/dL — ABNORMAL LOW (ref 12.0–15.0)
MCH: 28.8 pg (ref 26.0–34.0)
MCHC: 32.6 g/dL (ref 30.0–36.0)
MCV: 88.4 fL (ref 80.0–100.0)
Platelets: 319 10*3/uL (ref 150–400)
RBC: 3.89 MIL/uL (ref 3.87–5.11)
RDW: 14.6 % (ref 11.5–15.5)
WBC: 9.9 10*3/uL (ref 4.0–10.5)
nRBC: 0 % (ref 0.0–0.2)

## 2022-11-05 LAB — IRON AND TIBC
Iron: 36 ug/dL (ref 28–170)
Saturation Ratios: 47 % — ABNORMAL HIGH (ref 10.4–31.8)
TIBC: 260 ug/dL (ref 250–450)
UIBC: 137 ug/dL

## 2022-11-05 LAB — VITAMIN B12: Vitamin B-12: 784 pg/mL (ref 180–914)

## 2022-11-05 LAB — FERRITIN: Ferritin: 46 ng/mL (ref 11–307)

## 2022-11-05 MED ORDER — POTASSIUM CHLORIDE 10 MEQ/100ML IV SOLN
10.0000 meq | INTRAVENOUS | Status: AC
  Administered 2022-11-05 (×4): 10 meq via INTRAVENOUS
  Filled 2022-11-05 (×4): qty 100

## 2022-11-05 MED ORDER — MIDODRINE HCL 5 MG PO TABS
10.0000 mg | ORAL_TABLET | Freq: Three times a day (TID) | ORAL | Status: DC
  Administered 2022-11-05 – 2022-11-06 (×3): 10 mg via ORAL
  Filled 2022-11-05 (×3): qty 2

## 2022-11-05 NOTE — Progress Notes (Signed)
[facility] PROGRESS NOTE   AVITA LARGAESPADA JWJ:[id number] DOB: [birth year] DOA: 11/01/2022  PCP: Pcp, No  Brief History/Interval Summary: 62 y.o. 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 About a week ago the staff at the nursing facility noticed that her right groin area was looking more erythematous than usual.  She has a history of infection in that area and was hospitalized in December 2023 and was treated with IV antibiotics at that time.  It looks like a PICC line was placed at the skilled nursing facility a few days ago. Patient was started on ertapenem and ciprofloxacin.  Despite this her erythema did not improve and on the morning of admission she was noted to have low blood pressures and blood work showed worsening in her WBC.  She was sent over to the emergency department.  CT scan did not show any deep infection or abscess.  She was hospitalized for further management.    Consultants: None  Procedures: None    Subjective/Interval History: Denies any complaints.  Feels well overall.  No dizziness or lightheadedness.      Assessment/Plan:  Cellulitis of the right groin She failed outpatient treatment with ertapenem and ciprofloxacin.  PICC line was placed in the skilled nursing facility.   Started on vancomycin and cefepime.  Procalcitonin level less than 0.1.   WBC has improved.  She is afebrile.  Cultures without any growth so far.   CT scan did not show any obvious deep infection or abscess. She did well when she was discharged on cefadroxil when she was hospitalized for the same in December 2023.  She was transitioned yesterday.  WBC is normal today.  She is afebrile.  Continue current antibiotic regimen for now.     Hypotension Patient has chronically low blood pressures at baseline.  Lactic acid level was normal.  Cortisol level is normal.  She was given fluid bolus.  Pressures improved.  Continues to have occasional low blood pressures.  Continue with midodrine.  Dose will be increased today.  Sacral decubitus with chronic osteomyelitis Does not appear to be an active issue.  Wound care.  Previous history of stroke with lower extremity contractures Stable.  Continue aspirin.  History of depression Continue with SSRI.  Normocytic anemia Drop in hemoglobin is dilutional.  No evidence of overt bleeding.  Hemoglobin is stable. Anemia panel reviewed.  Ferritin 46, iron 36, TIBC 260, percent saturation 47, folate 8.4, B12 784.  DVT Prophylaxis: Lovenox Code Status: DNR Family Communication: Discussed with patient Disposition Plan: Anticipate discharge back to SNF tomorrow.  Status is: Inpatient Remains inpatient appropriate because: Right groin cellulitis requiring IV antibiotics   Medications: Scheduled:  aspirin  81 mg Oral QPM   cefadroxil  500 mg Oral BID   Chlorhexidine Gluconate Cloth  6 each Topical Daily   enoxaparin (LOVENOX) injection  40 mg Subcutaneous Q24H   FLUoxetine  40 mg Oral q AM   magnesium oxide  400 mg Oral q AM   melatonin  3 mg Oral QHS   midodrine  10 mg Oral TID WC   mirtazapine  7.5 mg Oral QHS   polyvinyl alcohol  1 drop Both Eyes BID   senna-docusate  2 tablet Oral BID  tiZANidine  4 mg Oral TID   Continuous:  potassium chloride 10 mEq (11/05/22 0841)   ZOX:[id number] **OR** acetaminophen, ondansetron **OR** ondansetron (ZOFRAN) IV, oxyCODONE  Antibiotics: Anti-infectives (From admission, onward)    Start     Dose/Rate Route Frequency Ordered Stop   11/04/22 1115  cefadroxil (DURICEF) capsule 500 mg        500 mg Oral 2 times daily 11/04/22 1021     11/02/22 0000  vancomycin (VANCOREADY) IVPB 1250 mg/250 mL  Status:  Discontinued        1,250  mg 166.7 mL/hr over 90 Minutes Intravenous Every 12 hours 11/01/22 1425 11/04/22 1021   11/01/22 2100  ceFEPIme (MAXIPIME) 2 g in sodium chloride 0.9 % 100 mL IVPB  Status:  Discontinued        2 g 200 mL/hr over 30 Minutes Intravenous Every 8 hours 11/01/22 1425 11/04/22 1021   11/01/22 1230  ceFEPIme (MAXIPIME) 2 g in sodium chloride 0.9 % 100 mL IVPB        2 g 200 mL/hr over 30 Minutes Intravenous  Once 11/01/22 1218 11/01/22 1438   11/01/22 1230  metroNIDAZOLE (FLAGYL) IVPB 500 mg        500 mg 100 mL/hr over 60 Minutes Intravenous  Once 11/01/22 1218 11/01/22 1632   11/01/22 1230  vancomycin (VANCOCIN) IVPB 1000 mg/200 mL premix        1,000 mg 200 mL/hr over 60 Minutes Intravenous  Once 11/01/22 1218 11/01/22 1734       Objective:  Vital Signs  Vitals:   11/05/22 0230 11/05/22 0430 11/05/22 0442 11/05/22 0748  BP: 96/61 103/77  104/68  Pulse:    67  Resp:  17  18  Temp:  98.2 F (36.8 C)  98.4 F (36.9 C)  TempSrc:  Oral  Oral  SpO2:  92%  96%  Weight:   63.5 kg   Height:        Intake/Output Summary (Last 24 hours) at 11/05/2022 0851 Last data filed at 11/05/2022 0439 Gross per 24 hour  Intake 340 ml  Output 600 ml  Net -260 ml    Filed Weights   11/03/22 0335 11/04/22 0400 11/05/22 0442  Weight: 63.5 kg 59.9 kg 63.5 kg   General appearance: Awake alert.  In no distress Resp: Clear to auscultation bilaterally.  Normal effort Cardio: S1-S2 is normal regular.  No S3-S4.  No rubs murmurs or bruit GI: Abdomen is soft.  Nontender nondistended.  Bowel sounds are present normal.  No masses organomegaly Extremities: Difficult examination area of concern due to her lower extremity contractures.  But erythema in the right groin appears to have improved.  Lab Results:  Data Reviewed: I have personally reviewed following labs and reports of the imaging studies  CBC: Recent Labs  Lab 11/01/22 1234 11/02/22 0254 11/03/22 0335 11/04/22 0122 11/05/22 0515  WBC  15.2* 11.7* 11.0* 11.3* 9.9  NEUTROABS 11.5*  --   --   --   --   HGB 12.0 12.2 10.5* 10.5* 11.2*  HCT 38.7 37.8 32.0* 32.6* 34.4*  MCV 92.6 88.5 89.6 88.8 88.4  PLT 341 325 284 299 319     Basic Metabolic Panel: Recent Labs  Lab 11/01/22 1234 11/02/22 0254 11/03/22 0335 11/04/22 0122 11/05/22 0515  NA 143 138 137 136 137  K 3.7 3.6 3.6 3.7 3.6  CL 106 105 106 105 105  CO2 28 25 22 22 25   GLUCOSE 133* 94 84 107* 96  BUN 28* 20 17 11 8   CREATININE 0.32* <0.30* 0.35* 0.40* 0.30*  CALCIUM 9.0 8.5* 8.4* 8.3* 8.5*     GFR: Estimated Creatinine Clearance: 67.3 mL/min (A) (by C-G formula based on SCr of 0.3 mg/dL (L)).  Liver Function Tests: Recent Labs  Lab 11/01/22 1234  AST 16  ALT 13  ALKPHOS 80  BILITOT 0.4  PROT 5.9*  ALBUMIN 2.8*      Coagulation Profile: Recent Labs  Lab 11/01/22 1234  INR 1.1      Recent Results (from the past 240 hour(s))  Blood Culture (routine x 2)     Status: None (Preliminary result)   Collection Time: 11/01/22 12:23 PM   Specimen: BLOOD  Result Value Ref Range Status   Specimen Description BLOOD PICC LINE  Final   Special Requests   Final    BOTTLES DRAWN AEROBIC AND ANAEROBIC Blood Culture results may not be optimal due to an excessive volume of blood received in culture bottles   Culture   Final    NO GROWTH 3 DAYS Performed at [facility], [address] [place], [place] [address]    Report Status PENDING  Incomplete  Resp panel by RT-PCR (RSV, Flu A&B, Covid) Anterior Nasal Swab     Status: None   Collection Time: 11/01/22  1:41 PM   Specimen: Anterior Nasal Swab  Result Value Ref Range Status   SARS Coronavirus 2 by RT PCR NEGATIVE NEGATIVE Final   Influenza A by PCR NEGATIVE NEGATIVE Final   Influenza B by PCR NEGATIVE NEGATIVE Final    Comment: (NOTE) The Xpert Xpress SARS-CoV-2/FLU/RSV plus assay is intended as an aid in the diagnosis of influenza from Nasopharyngeal swab specimens and should not be  used as a sole basis for treatment. Nasal washings and aspirates are unacceptable for Xpert Xpress SARS-CoV-2/FLU/RSV testing.  Fact Sheet for Patients: BloggerCourse.com  Fact Sheet for Healthcare Providers: SeriousBroker.it  This test is not yet approved or cleared by the [place] FDA and has been authorized for detection and/or diagnosis of SARS-CoV-2 by FDA under an Emergency Use Authorization (EUA). This EUA will remain in effect (meaning this test can be used) for the duration of the COVID-19 declaration under Section 564(b)(1) of the Act, 21 U.S.C. section 360bbb-3(b)(1), unless the authorization is terminated or revoked.     Resp Syncytial Virus by PCR NEGATIVE NEGATIVE Final    Comment: (NOTE) Fact Sheet for Patients: BloggerCourse.com  Fact Sheet for Healthcare Providers: SeriousBroker.it  This test is not yet approved or cleared by the [place] FDA and has been authorized for detection and/or diagnosis of SARS-CoV-2 by FDA under an Emergency Use Authorization (EUA). This EUA will remain in effect (meaning this test can be used) for the duration of the COVID-19 declaration under Section 564(b)(1) of the Act, 21 U.S.C. section 360bbb-3(b)(1), unless the authorization is terminated or revoked.  Performed at [facility], [address], [place], [place] [address]   Blood Culture (routine x 2)     Status: None (Preliminary result)   Collection Time: 11/01/22  3:33 PM   Specimen: BLOOD LEFT FOREARM  Result Value Ref Range Status   Specimen Description BLOOD LEFT FOREARM  Final   Special Requests   Final    BOTTLES DRAWN AEROBIC AND ANAEROBIC Blood Culture adequate volume   Culture   Final    NO GROWTH 3 DAYS Performed at [facility], [address] [place], [place] [address]  Report Status PENDING  Incomplete      Radiology Studies: No  results found.     LOS: 4 days   Adda Stokes [employer] on [email or web address]  11/05/2022, 8:51 AM

## 2022-11-05 NOTE — Progress Notes (Signed)
   11/04/22 2339  Vitals  Temp 98.1 F (36.7 C)  Temp Source Oral  BP (!) 75/46  MAP (mmHg) (!) 56  BP Location Right Arm  BP Method Automatic  Patient Position (if appropriate) Lying  Pulse Rate (!) 53  ECG Heart Rate (!) 54  Resp 17  Level of Consciousness  Level of Consciousness Alert  MEWS COLOR  MEWS Score Color Yellow  Oxygen Therapy  SpO2 91 %  O2 Device Room Air  MEWS Score  MEWS Temp 0  MEWS Systolic 2  MEWS Pulse 0  MEWS RR 0  MEWS LOC 0  MEWS Score 2   Pt's BP is low, pt is asymptomatic. Dr. Julian Reil notified and ordered a bolus of 1L lactated ringer. Will recheck BP after the bolus.

## 2022-11-06 LAB — CULTURE, BLOOD (ROUTINE X 2): Culture: NO GROWTH

## 2022-11-06 MED ORDER — TRAMADOL HCL 50 MG PO TABS: 50.0000 mg | ORAL_TABLET | Freq: Four times a day (QID) | ORAL | 0 refills | Status: DC | PRN

## 2022-11-06 MED ORDER — CEFADROXIL 500 MG PO CAPS: 500.0000 mg | ORAL_CAPSULE | Freq: Two times a day (BID) | ORAL | 0 refills | Status: AC

## 2022-11-06 MED ORDER — MIDODRINE HCL 10 MG PO TABS: 10.0000 mg | ORAL_TABLET | Freq: Three times a day (TID) | ORAL | Status: AC

## 2022-11-06 NOTE — Progress Notes (Signed)
PT cleaned up before transported. Report given to Nurse at [facility]. Packet and prescription given to transport. Deny any questions at this time

## 2022-11-06 NOTE — TOC Transition Note (Signed)
Transition of Care [facility]) - CM/SW Discharge Note   Patient Details  Name: [name] [name] MRN: [id number] Date of Birth: 1960-08-10  Transition of Care [facility]) CM/SW Contact:  Leander Rams, LCSW Phone Number: 11/06/2022, 10:30 AM   Clinical Narrative:    [facility] will [facility] to: [facility] Anticipated DC date: 5/13/12024 Family notified: Ethelene Browns  Transport by: Sharin Mons   Per MD patient ready for DC to [facility]. RN, patient, patient's family, and facility notified of DC. Discharge Summary and FL2 sent to facility. RN to call report prior to discharge [phone]. DC packet on chart. Ambulance transport requested for patient.   CSW will sign off for now as social work intervention is no longer needed. Please consult [place] again if new needs arise.    Final next level of care: Long Term Nursing Home Barriers to Discharge: No Barriers Identified   Patient Goals and CMS Choice      Discharge Placement                Patient chooses bed at:  [facility]) Patient to be transferred to facility by: [facility] Name of family member notified: Ethelene Browns Patient and family notified of of transfer: 11/06/22  Discharge Plan and Services Additional resources added to the After Visit Summary for                                       Social Determinants of Health (SDOH) Interventions SDOH Screenings   Food Insecurity: No Food Insecurity (11/01/2022)  Housing: Low Risk  (11/01/2022)  Transportation Needs: No Transportation Needs (11/01/2022)  Utilities: Not At Risk (11/01/2022)  Depression (PHQ2-9): Low Risk  (12/20/2021)  Tobacco Use: High Risk (11/01/2022)     Readmission Risk Interventions     No data to display         Oletta Lamas, MSW, LCSWA, LCASA Transitions of Care  Clinical Social Worker I

## 2022-11-06 NOTE — Progress Notes (Signed)
This chaplain responded to the consult for providing Living Will education. The Pt. is sleeping at the time of the visit and did not wake up to the call of her name. The chaplain left AD education on the Pt. bedside table.  This chaplain is available for F/U spiritual care as needed.  Chaplain Stephanie Acre [phone]

## 2022-11-06 NOTE — Progress Notes (Signed)
Per order from Dr Rito Ehrlich, 37 cm Single Lumen Peripherally Inserted Central Catheter removed from left arm, tip intact. No sutures present. RN confirmed length per chart. Dressing was clean and dry. Petroleum dressing applied. Pt advised no heavy lifting with this arm, leave dressing for 24 hours or seek emergent care if dressing becomes soaked with blood or swelling or sharp pain presents. Patient verbalized understanding and agreement.  Patient's questions answered to their satisfaction. Patient tolerated procedure well, Primary RN notified of removal.  Rosanna Randy, RN

## 2022-11-06 NOTE — Discharge Summary (Signed)
[facility]  Physician Discharge Summary   Patient ID: [name] [name] MRN: [id number] DOB/AGE: [birth year] 62 y.o.  Admit date: 11/01/2022 Discharge date:   11/06/2022   PCP: Pcp, No  DISCHARGE DIAGNOSES:    Cellulitis of right groin   History of CVA (cerebrovascular accident)   History of depression   Decubitus ulcer of sacral region   RECOMMENDATIONS FOR OUTPATIENT FOLLOW UP: Check CBC and basic metabolic panel in 1 week   Home Health: Going to SNF Equipment/Devices: None  CODE STATUS: DNR  DISCHARGE CONDITION: fair  Diet recommendation: Regular as tolerated  INITIAL HISTORY: 62 y.o. 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 About a week ago the staff at the nursing facility noticed that her right groin area was looking more erythematous than usual. She has a history of infection in that area and was hospitalized in December 2023 and was treated with IV antibiotics at that time. It looks like a PICC line was placed at the skilled nursing facility a few days ago. Patient was started on ertapenem and ciprofloxacin. Despite this her erythema did not improve and on the morning of admission she was noted to have low blood pressures and blood work showed worsening in her WBC. She was sent over to the emergency department. CT scan did not show any deep infection or abscess. She was hospitalized for further management.    HOSPITAL COURSE:   Cellulitis of the right groin She failed outpatient treatment with ertapenem and ciprofloxacin.  PICC line was placed in the skilled nursing facility.  Started on vancomycin and cefepime.  Procalcitonin level less than 0.1.   WBC has improved.  She is afebrile.  Cultures without any  growth so far.   CT scan did not show any obvious deep infection or abscess. She was transitioned to cefadroxil.  Feels much better.  Still has some right hip pain which seems to be chronic and related to her lower extremity contractures.  Seems to be stable for discharge.   Hypotension Patient has chronically low blood pressures at baseline.  Lactic acid level was normal.  Cortisol level is normal.  She was given fluid bolus.  Subsequently started on midodrine.  Blood pressure remains low but stable.  She is asymptomatic.     Sacral decubitus with chronic osteomyelitis Does not appear to be an active issue.  Wound care.  Consider referral to wound care center.   Previous history of stroke with lower extremity contractures Stable.  Continue aspirin.   History of depression Continue with SSRI.   Normocytic anemia Drop in hemoglobin is dilutional.  No evidence of overt bleeding.  Hemoglobin is stable. Anemia panel reviewed.  Ferritin 46, iron 36, TIBC 260, percent saturation 47, folate 8.4, B12 784.  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 chronic,nonhealing, epibole of the wound edges (Active)  11/02/22 0846  Location: Sacrum  Location Orientation: Mid  Staging: Stage 4 - Full thickness tissue loss with exposed bone, tendon or muscle.  Wound Description (Comments): chronic,nonhealing, epibole of the wound edges  Present on Admission: Yes    Patient is stable.  Okay for discharge back to SNF today.  PERTINENT LABS:  The results of significant diagnostics from this hospitalization (including imaging, microbiology, ancillary and laboratory) are listed below for reference.    Microbiology: Recent Results (from the past 240 hour(s))  Blood Culture (routine x 2)     Status: None   Collection Time: 11/01/22 12:23 PM   Specimen: BLOOD  Result Value Ref Range Status    Specimen Description BLOOD PICC LINE  Final   Special Requests   Final    BOTTLES DRAWN AEROBIC AND ANAEROBIC Blood Culture results may not be optimal due to an excessive volume of blood received in culture bottles   Culture   Final    NO GROWTH 5 DAYS Performed at [facility], [address] [place], [place] [address]    Report Status 11/06/2022 FINAL  Final  Resp panel by RT-PCR (RSV, Flu A&B, Covid) Anterior Nasal Swab     Status: None   Collection Time: 11/01/22  1:41 PM   Specimen: Anterior Nasal Swab  Result Value Ref Range Status   SARS Coronavirus 2 by RT PCR NEGATIVE NEGATIVE Final   Influenza A by PCR NEGATIVE NEGATIVE Final   Influenza B by PCR NEGATIVE NEGATIVE Final    Comment: (NOTE) The Xpert Xpress SARS-CoV-2/FLU/RSV plus assay is intended as an aid in the diagnosis of influenza from Nasopharyngeal swab specimens and should not be used as a sole basis for treatment. Nasal washings and aspirates are unacceptable for Xpert Xpress SARS-CoV-2/FLU/RSV testing.  Fact Sheet for Patients: BloggerCourse.com  Fact Sheet for Healthcare Providers: SeriousBroker.it  This test is not yet approved or cleared by the [place] FDA and has been authorized for detection and/or diagnosis of SARS-CoV-2 by FDA under an Emergency Use Authorization (EUA). This EUA will remain in effect (meaning this test can be used) for the duration of the COVID-19 declaration under Section 564(b)(1) of the Act, 21 U.S.C. section 360bbb-3(b)(1), unless the authorization is terminated or revoked.     Resp Syncytial Virus by PCR NEGATIVE NEGATIVE Final    Comment: (NOTE) Fact Sheet for Patients: BloggerCourse.com  Fact Sheet for Healthcare Providers: SeriousBroker.it  This test is not yet approved or cleared by the [place] FDA and has been authorized for detection and/or diagnosis  of SARS-CoV-2 by FDA under an Emergency Use Authorization (EUA). This EUA will remain in effect (meaning this test can be used) for the duration of the COVID-19 declaration under Section 564(b)(1) of the Act, 21 U.S.C. section 360bbb-3(b)(1), unless the authorization is terminated or revoked.  Performed at [facility], [address] [place], [place] [address]   Blood Culture (routine x 2)     Status: None   Collection Time: 11/01/22  3:33 PM   Specimen: BLOOD LEFT FOREARM  Result Value Ref Range Status   Specimen Description BLOOD LEFT FOREARM  Final   Special Requests   Final    BOTTLES DRAWN AEROBIC AND ANAEROBIC Blood Culture adequate volume   Culture   Final    NO GROWTH 5 DAYS Performed at [facility], [address], [place], [place] [address]    Report Status 11/06/2022 FINAL  Final     Labs:   Basic Metabolic Panel: Recent Labs  Lab 11/01/22 1234 11/02/22 0254 11/03/22 0335 11/04/22 0122 11/05/22 0515  NA 143 138 137 136 137  K 3.7 3.6 3.6 3.7 3.6  CL 106 105 106 105 105  CO2 28 25 22 22 25   GLUCOSE 133* 94 84  107* 96  BUN 28* 20 17 11 8   CREATININE 0.32* <0.30* 0.35* 0.40* 0.30*  CALCIUM 9.0 8.5* 8.4* 8.3* 8.5*   Liver Function Tests: Recent Labs  Lab 11/01/22 1234  AST 16  ALT 13  ALKPHOS 80  BILITOT 0.4  PROT 5.9*  ALBUMIN 2.8*    CBC: Recent Labs  Lab 11/01/22 1234 11/02/22 0254 11/03/22 0335 11/04/22 0122 11/05/22 0515  WBC 15.2* 11.7* 11.0* 11.3* 9.9  NEUTROABS 11.5*  --   --   --   --   HGB 12.0 12.2 10.5* 10.5* 11.2*  HCT 38.7 37.8 32.0* 32.6* 34.4*  MCV 92.6 88.5 89.6 88.8 88.4  PLT 341 325 284 299 319     IMAGING STUDIES CT ABDOMEN PELVIS W CONTRAST  Result Date: 11/01/2022 CLINICAL DATA:  Abdominal, postoperative, history of sacral osteomyelitis EXAM: CT ABDOMEN AND PELVIS WITH CONTRAST TECHNIQUE: Multidetector CT imaging of the abdomen and pelvis was performed using the standard protocol following bolus  administration of intravenous contrast. RADIATION DOSE REDUCTION: This exam was performed according to the departmental dose-optimization program which includes automated exposure control, adjustment of the mA and/or kV according to patient size and/or use of iterative reconstruction technique. CONTRAST:  75mL OMNIPAQUE IOHEXOL 350 MG/ML SOLN COMPARISON:  06/03/2022 FINDINGS: Lower chest: Dependent bibasilar atelectasis or consolidation, more conspicuous on the left (series 4, image 8). Coronary artery calcifications. Hepatobiliary: No solid liver abnormality is seen. No gallstones, gallbladder wall thickening, or biliary dilatation. Pancreas: Unremarkable. No pancreatic ductal dilatation or surrounding inflammatory changes. Spleen: Normal in size without significant abnormality. Adrenals/Urinary Tract: Adrenal glands are unremarkable. Simple, benign right renal cortical cysts, for which no further follow-up or characterization is required. Small nonobstructive calculus of the superior pole of the right kidney. No left-sided calculi, ureteral calculi, or hydronephrosis. Bladder is normal. Stomach/Bowel: Diverticulum of the fundus (series 3, image 22). Stomach is otherwise normal. Appendix appears normal. No evidence of bowel wall thickening, distention, or inflammatory changes. Large burden of stool and stool balls throughout the colon and rectum. Vascular/Lymphatic: Aortic atherosclerosis. No enlarged abdominal or pelvic lymph nodes. Reproductive: No mass or other significant abnormality. Other: No abdominal wall hernia or abnormality. No ascites. Musculoskeletal: Osteopenia. New, although age indeterminate superior endplate wedge deformity of L4 (series 7, image 94). Status post bilateral hip arthroplasty. Severe sarcopenia. Decubitus ulceration of the lower sacrum and coccyx (series 3, image 71). Decubitus ulceration of the left ischium and hip (series 3, image 71). No fluid collection or other acute findings.  IMPRESSION: 1. No acute CT findings of the abdomen or pelvis. 2. Decubitus ulceration of the lower sacrum and coccyx. Decubitus ulceration of the left ischium and hip. No fluid collection or other acute findings. MRI is the test of choice to assess for bone marrow edema and osteomyelitis if clinically suspected. 3. New, although age indeterminate superior endplate wedge deformity of L4. Correlate for acute point tenderness. 4. Nonobstructive right nephrolithiasis. 5. Dependent bibasilar atelectasis or consolidation, more conspicuous on the left. 6. Coronary artery disease. Aortic Atherosclerosis (ICD10-I70.0). Electronically Signed   By: Jearld Lesch M.D.   On: 11/01/2022 16:15   DG Chest Port 1 View  Result Date: 11/01/2022 CLINICAL DATA:  Questionable sepsis EXAM: PORTABLE CHEST 1 VIEW COMPARISON:  CXR 11/21/17 FINDINGS: No pleural effusion. No pneumothorax. No focal airspace opacity. Normal cardiac and mediastinal contours. No focal airspace opacity. No radiographically apparent displaced rib fractures. There are prominent bilateral interstitial opacities which could represent atypical infection or pulmonary venous congestion.  IMPRESSION: Prominent bilateral interstitial opacities could represent atypical infection or pulmonary venous congestion. Electronically Signed   By: Lorenza Cambridge M.D.   On: 11/01/2022 12:35    DISCHARGE EXAMINATION: Vitals:   11/05/22 0748 11/05/22 1925 11/06/22 0022 11/06/22 0356  BP: 104/68 111/76 91/60 98/83   Pulse: 67 (!) 57 67 62  Resp: 18 18 18 18   Temp: 98.4 F (36.9 C) 98.7 F (37.1 C) 97.6 F (36.4 C) 98.1 F (36.7 C)  TempSrc: Oral Oral Oral Oral  SpO2: 96% 96%  96%  Weight:   60.3 kg   Height:       General appearance: Awake alert.  In no distress Resp: Clear to auscultation bilaterally.  Normal effort Cardio: S1-S2 is normal regular.  No S3-S4.  No rubs murmurs or bruit GI: Abdomen is soft.  Nontender nondistended.  Bowel sounds are present normal.  No  masses organomegaly   DISPOSITION: SNF  Discharge Instructions     Call MD for:  difficulty breathing, headache or visual disturbances   Complete by: As directed    Call MD for:  extreme fatigue   Complete by: As directed    Call MD for:  persistant dizziness or light-headedness   Complete by: As directed    Call MD for:  persistant nausea and vomiting   Complete by: As directed    Call MD for:  severe uncontrolled pain   Complete by: As directed    Call MD for:  temperature >100.4   Complete by: As directed    Diet - low sodium heart healthy   Complete by: As directed    Discharge instructions   Complete by: As directed    Please review instructions on the discharge summary.  You were cared for by a hospitalist during your hospital stay. If you have any questions about your discharge medications or the care you received while you were in the hospital after you are discharged, you can call the unit and asked to speak with the hospitalist on call if the hospitalist that took care of you is not available. Once you are discharged, your primary care physician will handle any further medical issues. Please note that NO REFILLS for any discharge medications will be authorized once you are discharged, as it is imperative that you return to your primary care physician (or establish a relationship with a primary care physician if you do not have one) for your aftercare needs so that they can reassess your need for medications and monitor your lab values. If you do not have a primary care physician, you can call [phone] for a physician referral.   Discharge wound care:   Complete by: As directed    Wound care  Every other day    Comments: Cleanse sacral wound with NS and pat dry. Apply aquacel to open wound. Top with sacral silicone foam. Change every other day and PRN soilage.   Increase activity slowly   Complete by: As directed           Allergies as of 11/06/2022       Reactions    Ativan [lorazepam] Other (See Comments)   Unknown reaction Listed on MAR   Bactrim [sulfamethoxazole-trimethoprim] Other (See Comments)   Unknown reaction  Listed on MAR   Paxil [paroxetine Hcl] Other (See Comments)   Unknown reaction Listed on MAR        Medication List     TAKE these medications    ascorbic acid 500 MG  tablet Commonly known as: VITAMIN C Take 500 mg by mouth in the morning. (0900)   aspirin 81 MG chewable tablet Chew 81 mg by mouth every evening. (1900)   Calcium 600-D 600-10 MG-MCG Tabs Generic drug: Calcium Carb-Cholecalciferol Take 1 tablet by mouth in the morning. (0900)   cefadroxil 500 MG capsule Commonly known as: DURICEF Take 1 capsule (500 mg total) by mouth 2 (two) times daily for 7 days.   cholecalciferol 25 MCG (1000 UNIT) tablet Commonly known as: VITAMIN D3 Take 1,000 Units by mouth in the morning. (0800)   FLUoxetine 40 MG capsule Commonly known as: PROZAC Take 40 mg by mouth in the morning. (0800)   gabapentin 600 MG tablet Commonly known as: NEURONTIN Take 600 mg by mouth every 8 (eight) hours. (0600, 1400, 2200)   ibuprofen 400 MG tablet Commonly known as: ADVIL Take 800 mg by mouth every 6 (six) hours as needed (pain).   LACTOBACILLUS PO Take 1 capsule by mouth 2 (two) times daily. (0800, 2000)   loperamide 2 MG capsule Commonly known as: IMODIUM Take 2 mg by mouth every 6 (six) hours as needed for diarrhea or loose stools.   magnesium oxide 400 (240 Mg) MG tablet Commonly known as: MAG-OX Take 400 mg by mouth in the morning. (0800)   melatonin 3 MG Tabs tablet Take 3 mg by mouth at bedtime. (2100)   midodrine 10 MG tablet Commonly known as: PROAMATINE Take 1 tablet (10 mg total) by mouth 3 (three) times daily with meals.   mirtazapine 7.5 MG tablet Commonly known as: REMERON Take 7.5 mg by mouth at bedtime. (2100)   NUTRITIONAL DRINK PO Take 1 each by mouth 3 (three) times daily. House 2.0 (supplement)    OXYGEN Inhale 2 L into the lungs as needed (oxygen level < 90% on room air).   Pro-Stat Liqd Take 30 mLs by mouth 3 (three) times daily.   senna-docusate 8.6-50 MG tablet Commonly known as: Senokot-S Take 2 tablets by mouth See admin instructions. 2 entries on MAR: 2 tablets twice daily (0900, 1700) + "give 2 tablets by mouth as needed for constipation, give 1 tablet by mouth"   Systane Complete 0.6 % Soln Generic drug: Propylene Glycol Place 1 drop into both eyes 2 (two) times daily. (0800, 2000)   tiZANidine 4 MG tablet Commonly known as: ZANAFLEX Take 4 mg by mouth 3 (three) times daily.   traMADol 50 MG tablet Commonly known as: ULTRAM Take 1 tablet (50 mg total) by mouth every 6 (six) hours as needed. 2 entries on MAR : 50 mg every morning 30 minutes prior to dressing changes + 50 mg every 6 hours as needed for moderate/severe pain What changed:  when to take this reasons to take this   Voltaren 1 % Gel Generic drug: diclofenac Sodium Apply 4 g topically every 6 (six) hours as needed (pain). To right hip   Zinc Oxide 10 % Oint Apply 1 application  topically 2 (two) times daily. (0900, 2100)               Discharge Care Instructions  (From admission, onward)           Start     Ordered   11/06/22 0000  Discharge wound care:       Comments: Wound care  Every other day    Comments: Cleanse sacral wound with NS and pat dry. Apply aquacel to open wound. Top with sacral silicone foam. Change every other  day and PRN soilage.   11/06/22 0838               TOTAL DISCHARGE TIME: 35 minutes  Lesette Frary [employer] Pager on [email or web address]  11/06/2022, 8:38 AM

## 2023-02-02 ENCOUNTER — Other Ambulatory Visit: Payer: Self-pay

## 2023-02-02 ENCOUNTER — Inpatient Hospital Stay (HOSPITAL_COMMUNITY)
Admission: EM | Admit: 2023-02-02 | Discharge: 2023-02-06 | DRG: 539 | Disposition: A | Discharge status: Skilled Nursing Facility | Payer: Medicaid Other | Attending: Family Medicine | Admitting: Family Medicine

## 2023-02-02 ENCOUNTER — Emergency Department (HOSPITAL_COMMUNITY): Payer: Medicaid Other

## 2023-02-02 ENCOUNTER — Encounter (HOSPITAL_COMMUNITY): Payer: Self-pay | Admitting: *Deleted

## 2023-02-02 DIAGNOSIS — Z7982 Long term (current) use of aspirin: Secondary | ICD-10-CM

## 2023-02-02 DIAGNOSIS — I959 Hypotension, unspecified: Secondary | ICD-10-CM | POA: Diagnosis present

## 2023-02-02 DIAGNOSIS — Z86711 Personal history of pulmonary embolism: Secondary | ICD-10-CM | POA: Diagnosis not present

## 2023-02-02 DIAGNOSIS — F319 Bipolar disorder, unspecified: Secondary | ICD-10-CM | POA: Diagnosis present

## 2023-02-02 DIAGNOSIS — L98429 Non-pressure chronic ulcer of back with unspecified severity: Secondary | ICD-10-CM

## 2023-02-02 DIAGNOSIS — M2459 Contracture, other specified joint: Secondary | ICD-10-CM | POA: Diagnosis present

## 2023-02-02 DIAGNOSIS — G8929 Other chronic pain: Secondary | ICD-10-CM | POA: Diagnosis present

## 2023-02-02 DIAGNOSIS — R54 Age-related physical debility: Secondary | ICD-10-CM | POA: Diagnosis present

## 2023-02-02 DIAGNOSIS — Z823 Family history of stroke: Secondary | ICD-10-CM | POA: Diagnosis not present

## 2023-02-02 DIAGNOSIS — R58 Hemorrhage, not elsewhere classified: Secondary | ICD-10-CM | POA: Diagnosis not present

## 2023-02-02 DIAGNOSIS — Z7401 Bed confinement status: Secondary | ICD-10-CM

## 2023-02-02 DIAGNOSIS — Z8673 Personal history of transient ischemic attack (TIA), and cerebral infarction without residual deficits: Secondary | ICD-10-CM | POA: Diagnosis not present

## 2023-02-02 DIAGNOSIS — M810 Age-related osteoporosis without current pathological fracture: Secondary | ICD-10-CM | POA: Diagnosis present

## 2023-02-02 DIAGNOSIS — L03314 Cellulitis of groin: Secondary | ICD-10-CM | POA: Diagnosis present

## 2023-02-02 DIAGNOSIS — R52 Pain, unspecified: Secondary | ICD-10-CM | POA: Diagnosis present

## 2023-02-02 DIAGNOSIS — L089 Local infection of the skin and subcutaneous tissue, unspecified: Secondary | ICD-10-CM | POA: Diagnosis present

## 2023-02-02 DIAGNOSIS — L309 Dermatitis, unspecified: Secondary | ICD-10-CM | POA: Diagnosis present

## 2023-02-02 DIAGNOSIS — B379 Candidiasis, unspecified: Secondary | ICD-10-CM | POA: Diagnosis present

## 2023-02-02 DIAGNOSIS — Z66 Do not resuscitate: Secondary | ICD-10-CM | POA: Diagnosis present

## 2023-02-02 DIAGNOSIS — Z96643 Presence of artificial hip joint, bilateral: Secondary | ICD-10-CM | POA: Diagnosis present

## 2023-02-02 DIAGNOSIS — F17210 Nicotine dependence, cigarettes, uncomplicated: Secondary | ICD-10-CM | POA: Diagnosis present

## 2023-02-02 DIAGNOSIS — Z888 Allergy status to other drugs, medicaments and biological substances status: Secondary | ICD-10-CM | POA: Diagnosis not present

## 2023-02-02 DIAGNOSIS — Z79899 Other long term (current) drug therapy: Secondary | ICD-10-CM

## 2023-02-02 DIAGNOSIS — Z681 Body mass index (BMI) 19 or less, adult: Secondary | ICD-10-CM | POA: Diagnosis not present

## 2023-02-02 DIAGNOSIS — F419 Anxiety disorder, unspecified: Secondary | ICD-10-CM | POA: Diagnosis present

## 2023-02-02 DIAGNOSIS — M109 Gout, unspecified: Secondary | ICD-10-CM | POA: Diagnosis present

## 2023-02-02 DIAGNOSIS — M869 Osteomyelitis, unspecified: Secondary | ICD-10-CM | POA: Insufficient documentation

## 2023-02-02 DIAGNOSIS — M4628 Osteomyelitis of vertebra, sacral and sacrococcygeal region: Secondary | ICD-10-CM | POA: Diagnosis present

## 2023-02-02 DIAGNOSIS — Z8249 Family history of ischemic heart disease and other diseases of the circulatory system: Secondary | ICD-10-CM

## 2023-02-02 DIAGNOSIS — L89153 Pressure ulcer of sacral region, stage 3: Secondary | ICD-10-CM | POA: Diagnosis present

## 2023-02-02 DIAGNOSIS — Z515 Encounter for palliative care: Secondary | ICD-10-CM | POA: Diagnosis not present

## 2023-02-02 DIAGNOSIS — Z7189 Other specified counseling: Secondary | ICD-10-CM | POA: Diagnosis not present

## 2023-02-02 LAB — COMPREHENSIVE METABOLIC PANEL
ALT: 27 U/L (ref 0–44)
AST: 22 U/L (ref 15–41)
Albumin: 3.4 g/dL — ABNORMAL LOW (ref 3.5–5.0)
Alkaline Phosphatase: 91 U/L (ref 38–126)
Anion gap: 12 (ref 5–15)
BUN: 23 mg/dL — ABNORMAL HIGH (ref 6–20)
CO2: 26 mmol/L (ref 22–32)
Calcium: 9.2 mg/dL (ref 8.9–10.3)
Chloride: 98 mmol/L (ref 98–111)
Creatinine, Ser: 0.36 mg/dL — ABNORMAL LOW (ref 0.44–1.00)
GFR, Estimated: >60 mL/min (ref >60)
Glucose, Bld: 94 mg/dL (ref 70–99)
Potassium: 3.6 mmol/L (ref 3.5–5.1)
Sodium: 136 mmol/L (ref 135–145)
Total Bilirubin: 0.5 mg/dL (ref 0.3–1.2)
Total Protein: 7.1 g/dL (ref 6.5–8.1)

## 2023-02-02 LAB — CBC
HCT: 40 % (ref 36.0–46.0)
Hemoglobin: 12.7 g/dL (ref 12.0–15.0)
MCH: 28 pg (ref 26.0–34.0)
MCHC: 31.8 g/dL (ref 30.0–36.0)
MCV: 88.3 fL (ref 80.0–100.0)
Platelets: 295 10*3/uL (ref 150–400)
RBC: 4.53 MIL/uL (ref 3.87–5.11)
RDW: 15.4 % (ref 11.5–15.5)
WBC: 9.8 10*3/uL (ref 4.0–10.5)
nRBC: 0 % (ref 0.0–0.2)

## 2023-02-02 LAB — PROTIME-INR
INR: 1 (ref 0.8–1.2)
Prothrombin Time: 13.6 seconds (ref 11.4–15.2)

## 2023-02-02 LAB — CBC WITH DIFFERENTIAL/PLATELET
Abs Immature Granulocytes: 0.03 10*3/uL (ref 0.00–0.07)
Basophils Absolute: 0 10*3/uL (ref 0.0–0.1)
Basophils Relative: 0 %
Eosinophils Absolute: 0.2 10*3/uL (ref 0.0–0.5)
Eosinophils Relative: 2 %
HCT: 41.9 % (ref 36.0–46.0)
Hemoglobin: 13.4 g/dL (ref 12.0–15.0)
Immature Granulocytes: 0 %
Lymphocytes Relative: 24 %
Lymphs Abs: 2.4 10*3/uL (ref 0.7–4.0)
MCH: 28 pg (ref 26.0–34.0)
MCHC: 32 g/dL (ref 30.0–36.0)
MCV: 87.5 fL (ref 80.0–100.0)
Monocytes Absolute: 0.8 10*3/uL (ref 0.1–1.0)
Monocytes Relative: 9 %
Neutro Abs: 6.3 10*3/uL (ref 1.7–7.7)
Neutrophils Relative %: 65 %
Platelets: 279 10*3/uL (ref 150–400)
RBC: 4.79 MIL/uL (ref 3.87–5.11)
RDW: 15.4 % (ref 11.5–15.5)
WBC: 9.7 10*3/uL (ref 4.0–10.5)
nRBC: 0 % (ref 0.0–0.2)

## 2023-02-02 LAB — TYPE AND SCREEN
ABO/RH(D): O POS
Antibody Screen: NEGATIVE

## 2023-02-02 LAB — I-STAT CG4 LACTIC ACID, ED: Lactic Acid, Venous: 1.1 mmol/L (ref 0.5–1.9)

## 2023-02-02 LAB — HIV ANTIBODY (ROUTINE TESTING W REFLEX): HIV Screen 4th Generation wRfx: NONREACTIVE

## 2023-02-02 LAB — APTT: aPTT: 29 seconds (ref 24–36)

## 2023-02-02 MED ORDER — FLUCONAZOLE 150 MG PO TABS
150.0000 mg | ORAL_TABLET | Freq: Every day | ORAL | Status: AC
  Administered 2023-02-03 – 2023-02-06 (×4): 150 mg via ORAL
  Filled 2023-02-02 (×4): qty 1

## 2023-02-02 MED ORDER — POLYETHYLENE GLYCOL 3350 17 G PO PACK: 17.0000 g | PACK | Freq: Every day | ORAL | Status: DC | PRN

## 2023-02-02 MED ORDER — MORPHINE SULFATE (PF) 4 MG/ML IV SOLN
4.0000 mg | Freq: Once | INTRAVENOUS | Status: AC
  Administered 2023-02-02: 4 mg via INTRAVENOUS
  Filled 2023-02-02: qty 1

## 2023-02-02 MED ORDER — MIRTAZAPINE 15 MG PO TABS
7.5000 mg | ORAL_TABLET | Freq: Every day | ORAL | Status: DC
  Administered 2023-02-02 – 2023-02-06 (×4): 7.5 mg via ORAL
  Filled 2023-02-02 (×5): qty 1

## 2023-02-02 MED ORDER — IOHEXOL 350 MG/ML SOLN
75.0000 mL | Freq: Once | INTRAVENOUS | Status: AC | PRN
  Administered 2023-02-02: 75 mL via INTRAVENOUS

## 2023-02-02 MED ORDER — ACETAMINOPHEN 325 MG PO TABS: 650.0000 mg | ORAL_TABLET | Freq: Four times a day (QID) | ORAL | Status: DC | PRN

## 2023-02-02 MED ORDER — SODIUM CHLORIDE 0.9 % IV SOLN
2.0000 g | Freq: Once | INTRAVENOUS | Status: AC
  Administered 2023-02-02: 2 g via INTRAVENOUS
  Filled 2023-02-02: qty 20

## 2023-02-02 MED ORDER — MORPHINE SULFATE (PF) 4 MG/ML IV SOLN
4.0000 mg | INTRAVENOUS | Status: DC | PRN
  Administered 2023-02-02 – 2023-02-04 (×11): 4 mg via INTRAVENOUS
  Filled 2023-02-02 (×12): qty 1

## 2023-02-02 MED ORDER — SODIUM CHLORIDE 0.9% FLUSH
3.0000 mL | Freq: Two times a day (BID) | INTRAVENOUS | Status: DC
  Administered 2023-02-02 – 2023-02-05 (×3): 3 mL via INTRAVENOUS

## 2023-02-02 MED ORDER — MELATONIN 3 MG PO TABS
3.0000 mg | ORAL_TABLET | Freq: Every day | ORAL | Status: DC
  Administered 2023-02-02 – 2023-02-06 (×5): 3 mg via ORAL
  Filled 2023-02-02 (×5): qty 1

## 2023-02-02 MED ORDER — POLYVINYL ALCOHOL 1.4 % OP SOLN
1.0000 [drp] | Freq: Two times a day (BID) | OPHTHALMIC | Status: DC
  Administered 2023-02-02 – 2023-02-06 (×5): 1 [drp] via OPHTHALMIC
  Filled 2023-02-02: qty 15

## 2023-02-02 MED ORDER — SODIUM CHLORIDE 0.9 % IV SOLN
3.0000 g | Freq: Four times a day (QID) | INTRAVENOUS | Status: DC
  Administered 2023-02-02 – 2023-02-06 (×16): 3 g via INTRAVENOUS
  Filled 2023-02-02 (×16): qty 8

## 2023-02-02 MED ORDER — TIZANIDINE HCL 4 MG PO TABS
4.0000 mg | ORAL_TABLET | Freq: Three times a day (TID) | ORAL | Status: DC
  Administered 2023-02-02 – 2023-02-06 (×12): 4 mg via ORAL
  Filled 2023-02-02 (×13): qty 1

## 2023-02-02 MED ORDER — NYSTATIN 100000 UNIT/GM EX POWD
Freq: Once | CUTANEOUS | Status: AC
  Filled 2023-02-02: qty 15

## 2023-02-02 MED ORDER — SENNA 8.6 MG PO TABS
2.0000 | ORAL_TABLET | Freq: Two times a day (BID) | ORAL | Status: DC
  Administered 2023-02-05 – 2023-02-06 (×3): 17.2 mg via ORAL
  Filled 2023-02-02 (×7): qty 2

## 2023-02-02 MED ORDER — FLUOXETINE HCL 20 MG PO CAPS
40.0000 mg | ORAL_CAPSULE | Freq: Every day | ORAL | Status: DC
  Administered 2023-02-03 – 2023-02-06 (×4): 40 mg via ORAL
  Filled 2023-02-02 (×4): qty 2

## 2023-02-02 MED ORDER — GABAPENTIN 300 MG PO CAPS
600.0000 mg | ORAL_CAPSULE | Freq: Three times a day (TID) | ORAL | Status: DC
  Administered 2023-02-02 – 2023-02-06 (×12): 600 mg via ORAL
  Filled 2023-02-02 (×13): qty 2

## 2023-02-02 MED ORDER — SODIUM CHLORIDE 0.9 % IV BOLUS
500.0000 mL | Freq: Once | INTRAVENOUS | Status: AC
  Administered 2023-02-02: 500 mL via INTRAVENOUS

## 2023-02-02 MED ORDER — MIDODRINE HCL 5 MG PO TABS
10.0000 mg | ORAL_TABLET | Freq: Three times a day (TID) | ORAL | Status: DC
  Administered 2023-02-03 – 2023-02-06 (×12): 10 mg via ORAL
  Filled 2023-02-02 (×12): qty 2

## 2023-02-02 MED ORDER — RISAQUAD PO CAPS
1.0000 | ORAL_CAPSULE | Freq: Two times a day (BID) | ORAL | Status: DC
  Administered 2023-02-02 – 2023-02-06 (×8): 1 via ORAL
  Filled 2023-02-02 (×9): qty 1

## 2023-02-02 MED ORDER — SODIUM CHLORIDE 0.9 % IV SOLN: INTRAVENOUS | Status: DC

## 2023-02-02 MED ORDER — VANCOMYCIN HCL 1250 MG/250ML IV SOLN
1250.0000 mg | Freq: Once | INTRAVENOUS | Status: AC
  Administered 2023-02-02: 1250 mg via INTRAVENOUS
  Filled 2023-02-02: qty 250

## 2023-02-02 MED ORDER — KETOROLAC TROMETHAMINE 15 MG/ML IJ SOLN
15.0000 mg | Freq: Once | INTRAMUSCULAR | Status: AC
  Administered 2023-02-02: 15 mg via INTRAVENOUS
  Filled 2023-02-02: qty 1

## 2023-02-02 MED ORDER — ONDANSETRON HCL 4 MG/2ML IJ SOLN
4.0000 mg | Freq: Once | INTRAMUSCULAR | Status: AC
  Administered 2023-02-02: 4 mg via INTRAVENOUS
  Filled 2023-02-02: qty 2

## 2023-02-02 MED ORDER — VITAMIN D 25 MCG (1000 UNIT) PO TABS
1000.0000 [IU] | ORAL_TABLET | Freq: Every day | ORAL | Status: DC
  Administered 2023-02-03 – 2023-02-06 (×4): 1000 [IU] via ORAL
  Filled 2023-02-02 (×4): qty 1

## 2023-02-02 MED ORDER — ACETAMINOPHEN 650 MG RE SUPP: 650.0000 mg | Freq: Four times a day (QID) | RECTAL | Status: DC | PRN

## 2023-02-02 NOTE — ED Notes (Signed)
ED TO INPATIENT HANDOFF REPORT  ED Nurse Name and Phone #: Amil Amen [phone]  S Name/Age/Gender [name] [name] 62 y.o. female Room/Bed: 003C/003C  Code Status   Code Status: DNR  Home/SNF/Other Nursing Home Patient oriented to: self, place, time, and situation Is this baseline? Yes   Triage Complete: Triage complete  Chief Complaint Osteomyelitis [facility]) [M86.9]  Triage Note Patient presents to ed via [facility] states she has an wound in her right groin that started bleeding yest. C/o wound to her tailbone. Patient is contracted. From previous stroke.    Allergies Allergies  Allergen Reactions   Ativan [Lorazepam] Other (See Comments)    Unknown reaction Listed on MAR   Bactrim [Sulfamethoxazole-Trimethoprim] Other (See Comments)    Unknown reaction  Listed on MAR   Paxil [Paroxetine Hcl] Other (See Comments)    Unknown reaction Listed on MAR    Level of Care/Admitting Diagnosis ED Disposition     ED Disposition  Admit   Condition  --   Comment  Hospital Area: [name] [[id number]]  Level of Care: Telemetry Medical [104]  May admit patient to [facility] or [facility] if equivalent level of care is available:: No  Covid Evaluation: Asymptomatic - no recent exposure (last 10 days) testing not required  Diagnosis: Osteomyelitis [facility]) [[id number]]  Admitting Physician: Nolberto Hanlon [[id number]]  Attending Physician: Nolberto Hanlon [[id number]]  Certification:: I certify this patient will need inpatient services for at least 2 midnights  Estimated Length of Stay: 3          B Medical/Surgery History Past Medical History:  Diagnosis Date   Anxiety    Bipolar disorder (HCC)    Chronic pain    CVA (cerebral vascular accident) (HCC) Jul 22, 1999   right side   Depression    Gout    Osteoporosis    Pap smear for cervical cancer screening    5+ years ago normal   Pulmonary embolism (HCC)    bilateral   Tobacco abuse    Vitamin D deficiency    Past  Surgical History:  Procedure Laterality Date   APPENDECTOMY     PARTIAL HIP ARTHROPLASTY Right October 2001   TOTAL HIP ARTHROPLASTY Left June 2000     A IV Location/Drains/Wounds Patient Lines/Drains/Airways Status     Active Line/Drains/Airways     Name Placement date Placement time Site Days   Peripheral IV 02/02/23 24 G Left;Posterior Hand 02/02/23  1057  Hand  less than 1   Peripheral IV 02/02/23 20 G 1.88" Anterior;Left Forearm 02/02/23  1620  Forearm  less than 1   External Urinary Catheter 11/04/22  0219  --  90   Pressure Injury 11/09/21 Coccyx Unstageable - Full thickness tissue loss in which the base of the injury is covered by slough (yellow, tan, gray, green or brown) and/or eschar (tan, brown or black) in the wound bed. 11/09/21  0223  -- 450   Pressure Injury 11/09/21 Ankle Left;Medial Unstageable - Full thickness tissue loss in which the base of the injury is covered by slough (yellow, tan, gray, green or brown) and/or eschar (tan, brown or black) in the wound bed. 11/09/21  0226  -- 450   Pressure Injury 11/09/21 Ankle Left;Lateral Unstageable - Full thickness tissue loss in which the base of the injury is covered by slough (yellow, tan, gray, green or brown) and/or eschar (tan, brown or black) in the wound bed. 11/09/21  0226  -- 450   Pressure  Injury 11/09/21 Buttocks Left Unstageable - Full thickness tissue loss in which the base of the injury is covered by slough (yellow, tan, gray, green or brown) and/or eschar (tan, brown or black) in the wound bed. 11/09/21  0043  -- 450   Pressure Injury 11/02/22 Sacrum Mid Stage 4 - Full thickness tissue loss with exposed bone, tendon or muscle. chronic,nonhealing, epibole of the wound edges 11/02/22  0846  -- 92   Wound / Incision (Open or Dehisced) 06/03/22 Thigh Left;Posterior;Proximal 06/03/22  1920  Thigh  244   Wound / Incision (Open or Dehisced) 06/03/22 Thigh Anterior;Proximal;Right Left inguinal 06/03/22  1920  Thigh  244    Wound / Incision (Open or Dehisced) 11/02/22 Non-pressure wound Groin Right 11/02/22  0844  Groin  92            Intake/Output Last 24 hours  Intake/Output Summary (Last 24 hours) at 02/02/2023 1948 Last data filed at 02/02/2023 1849 Gross per 24 hour  Intake 506.34 ml  Output --  Net 506.34 ml    Labs/Imaging Results for orders placed or performed during the hospital encounter of 02/02/23 (from the past 48 hour(s))  Comprehensive metabolic panel     Status: Abnormal   Collection Time: 02/02/23 11:34 AM  Result Value Ref Range   Sodium 136 135 - 145 mmol/L   Potassium 3.6 3.5 - 5.1 mmol/L   Chloride 98 98 - 111 mmol/L   CO2 26 22 - 32 mmol/L   Glucose, Bld 94 70 - 99 mg/dL    Comment: Glucose reference range applies only to samples taken after fasting for at least 8 hours.   BUN 23 (H) 6 - 20 mg/dL   Creatinine, Ser 1.61 (L) 0.44 - 1.00 mg/dL   Calcium 9.2 8.9 - 09.6 mg/dL   Total Protein 7.1 6.5 - 8.1 g/dL   Albumin 3.4 (L) 3.5 - 5.0 g/dL   AST 22 15 - 41 U/L   ALT 27 0 - 44 U/L   Alkaline Phosphatase 91 38 - 126 U/L   Total Bilirubin 0.5 0.3 - 1.2 mg/dL   GFR, Estimated >04 >54 mL/min    Comment: (NOTE) Calculated using the CKD-EPI Creatinine Equation (2021)    Anion gap 12 5 - 15    Comment: Performed at [facility], [address], [place], [place] [address]  CBC with Differential     Status: None   Collection Time: 02/02/23 11:34 AM  Result Value Ref Range   WBC 9.7 4.0 - 10.5 K/uL   RBC 4.79 3.87 - 5.11 MIL/uL   Hemoglobin 13.4 12.0 - 15.0 g/dL   HCT 91.4 78.2 - 95.6 %   MCV 87.5 80.0 - 100.0 fL   MCH 28.0 26.0 - 34.0 pg   MCHC 32.0 30.0 - 36.0 g/dL   RDW 21.3 08.6 - 57.8 %   Platelets 279 150 - 400 K/uL   nRBC 0.0 0.0 - 0.2 %   Neutrophils Relative % 65 %   Neutro Abs 6.3 1.7 - 7.7 K/uL   Lymphocytes Relative 24 %   Lymphs Abs 2.4 0.7 - 4.0 K/uL   Monocytes Relative 9 %   Monocytes Absolute 0.8 0.1 - 1.0 K/uL   Eosinophils Relative 2 %    Eosinophils Absolute 0.2 0.0 - 0.5 K/uL   Basophils Relative 0 %   Basophils Absolute 0.0 0.0 - 0.1 K/uL   Immature Granulocytes 0 %   Abs Immature Granulocytes 0.03 0.00 - 0.07  K/uL    Comment: Performed at [facility], [address], [place], [place] [address]  Protime-INR     Status: None   Collection Time: 02/02/23 11:34 AM  Result Value Ref Range   Prothrombin Time 13.6 11.4 - 15.2 seconds   INR 1.0 0.8 - 1.2    Comment: (NOTE) INR goal varies based on device and disease states. Performed at [facility], [address], [place], [place] [address]   APTT     Status: None   Collection Time: 02/02/23 11:34 AM  Result Value Ref Range   aPTT 29 24 - 36 seconds    Comment: Performed at [facility], [address] [place], [place] [address]  I-Stat Lactic Acid, ED     Status: None   Collection Time: 02/02/23 11:49 AM  Result Value Ref Range   Lactic Acid, Venous 1.1 0.5 - 1.9 mmol/L   CT PELVIS W CONTRAST  Result Date: 02/02/2023 CLINICAL DATA:  Soft tissue infection suspected. Evaluate for necrotizing fasciitis. EXAM: CT PELVIS WITH CONTRAST TECHNIQUE: Multidetector CT imaging of the pelvis was performed using the standard protocol following the bolus administration of intravenous contrast. RADIATION DOSE REDUCTION: This exam was performed according to the departmental dose-optimization program which includes automated exposure control, adjustment of the mA and/or kV according to patient size and/or use of iterative reconstruction technique. CONTRAST:  75mL OMNIPAQUE IOHEXOL 350 MG/ML SOLN COMPARISON:  CT abdomen and pelvis 11/01/2022 FINDINGS: Urinary Tract:  No abnormality visualized. Bowel:  Unremarkable visualized pelvic bowel loops. Vascular/Lymphatic: Atherosclerotic calcifications are present. No acute abnormality. No enlarged lymph nodes. Reproductive:  No mass or other significant abnormality Other:  There is no ascites or focal abdominal wall hernia.  Musculoskeletal: There is decubitus ulcer overlying the coccyx with some underlying cortical erosions of the sacrococcygeal region. There is no evidence for soft tissue abscess, foreign body or soft tissue emphysema. Bilateral hip arthroplasties are present. No acute fracture or hardware loosening identified. IMPRESSION: 1. Decubitus ulcer overlying the coccyx with underlying cortical erosions of the sacrococcygeal region concerning for osteomyelitis. 2. No evidence for soft tissue abscess, foreign body or soft tissue emphysema. Electronically Signed   By: Darliss Cheney M.D.   On: 02/02/2023 17:34    Pending Labs Unresulted Labs (From admission, onward)     Start     Ordered   02/03/23 0500  APTT  Tomorrow morning,   R        02/02/23 1904   02/03/23 0500  Protime-INR  Tomorrow morning,   R        02/02/23 1904   02/03/23 0500  CBC  Tomorrow morning,   R        02/02/23 1904   02/03/23 0500  Basic metabolic panel  Tomorrow morning,   R        02/02/23 1904   02/02/23 1904  Type and screen [name]  ONCE - STAT,   STAT       Comments: [facility]    02/02/23 1904   02/02/23 1904  HIV Antibody (routine testing w rflx)  (HIV Antibody (Routine testing w reflex) panel)  Once,   R        02/02/23 1904   02/02/23 1000  Blood Culture (routine x 2)  (Undifferentiated presentation (screening labs and basic nursing orders))  BLOOD CULTURE X 2,   STAT      02/02/23 9147  Vitals/Pain Today's Vitals   02/02/23 1628 02/02/23 1729 02/02/23 1847 02/02/23 1929  BP:    127/71  Pulse:    (!) 101  Resp:    14  Temp:  98.3 F (36.8 C)  98.5 F (36.9 C)  TempSrc:  Oral  Oral  SpO2:    99%  Weight:      Height:      PainSc: 10-Worst pain ever  0-No pain 0-No pain    Isolation Precautions No active isolations  Medications Medications  vancomycin (VANCOREADY) IVPB 1250 mg/250 mL (1,250 mg Intravenous New Bag/Given 02/02/23 1928)  nystatin  (MYCOSTATIN/NYSTOP) topical powder (has no administration in time range)  0.9 %  sodium chloride infusion (has no administration in time range)  acetaminophen (TYLENOL) tablet 650 mg (has no administration in time range)    Or  acetaminophen (TYLENOL) suppository 650 mg (has no administration in time range)  polyethylene glycol (MIRALAX / GLYCOLAX) packet 17 g (has no administration in time range)  sodium chloride flush (NS) 0.9 % injection 3 mL (has no administration in time range)  morphine (PF) 4 MG/ML injection 4 mg (has no administration in time range)  morphine (PF) 4 MG/ML injection 4 mg (4 mg Intravenous Given 02/02/23 1121)  morphine (PF) 4 MG/ML injection 4 mg (4 mg Intravenous Given 02/02/23 1723)  ondansetron (ZOFRAN) injection 4 mg (4 mg Intravenous Given 02/02/23 1723)  sodium chloride 0.9 % bolus 500 mL (0 mLs Intravenous Stopped 02/02/23 1849)  iohexol (OMNIPAQUE) 350 MG/ML injection 75 mL (75 mLs Intravenous Contrast Given 02/02/23 1654)  cefTRIAXone (ROCEPHIN) 2 g in sodium chloride 0.9 % 100 mL IVPB (0 g Intravenous Stopped 02/02/23 1927)  ketorolac (TORADOL) 15 MG/ML injection 15 mg (15 mg Intravenous Given 02/02/23 1849)    Mobility non-ambulatory     Focused Assessments Wound infection   R Recommendations: See Admitting Provider Note  Report given to:   Additional Notes: pt is completely contracted. Has infection to bilateral groin/skin folds. Vanco running now. Pt is A+Ox4.

## 2023-02-02 NOTE — Progress Notes (Signed)
New Admission Note: 70m-05   Arrival Method: ED stretcher Mental Orientation: a/ox4 Telemetry: box 4 Assessment: completed Skin: wound inguinal groin, unstageable sacrum IV: right forearm, hand Pain:7/10 Tubes: n/a Safety Measures: non-skid socks Admission: completed Orientation: Patient has been oriented to the room, unit and staff.  Family: n/a Belongings: kept at bedide  Orders have been reviewed and implemented. Will continue to monitor the patient. Call light has been placed within reach and bed alarm has been activated.   Fabian Sharp BSN, RN-BC Phone number: [phone]

## 2023-02-02 NOTE — Assessment & Plan Note (Addendum)
Patient is having recurrent blood staining of her suprapubic area.  This seems to be far removed from rectal bleeding or vaginal or urethral bleeding.  Which showed no stigmata of bleeding.  Unfortunately the source of bleeding is not possible to be determined at this time due to patient discomfort from her contractures while examining her.  Fortunately the bleeding does not seem to be exsanguinating at this time.  Given the foul smell I suspect there is a infection/ulcer that is present in the groin that is causing the bleeding to happen.  Patient's blood cultures have been drawn and she has been given empiric vancomycin and ceftriaxone.  Given concern for anaerobic infections, I will will continue with Unasyn at this time.  I will request a surgical evaluation for possible evaluation under general anesthesia in OR to ascertain what is exactly going on. I discussed the case wthi Dr. Axel Filler.  Type and screen sent, I will make the patient n.p.o. past midnight till surgical evaluation is completed

## 2023-02-02 NOTE — H&P (Addendum)
History and Physical    Patient: [name] [name] JJK:[id number] DOB: [birth year] DOA: 02/02/2023 DOS: the patient was seen and examined on 02/02/2023 PCP: Pcp, No  Patient coming from: SNF  Chief Complaint:  Chief Complaint  Patient presents with   Wound Infection   HPI: [name] [name] is a 62 y.o. 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 The only extremity where apteint has function is left upper.  Patient resides in a nursing home and during routine care was noted to have groin bleeding today -see exam below for further details..  Patient is sent to [facility], ER.  There is no report of patient having loss of consciousness palpitation shortness of breath.  No report of nausea vomiting or diarrhea.  Patient has chronic pain related to her sacral wound and attempted to turn her.  Patient does not report any worsening new discomfort. Review of Systems: As mentioned in the history of present illness. All other systems reviewed and are negative. Past Medical History:  Diagnosis Date   Anxiety    Bipolar disorder (HCC)    Chronic pain    CVA (cerebral vascular accident) (HCC) Jul 22, 1999   right side   Depression    Gout    Osteoporosis    Pap smear for cervical cancer screening    5+ years ago normal   Pulmonary embolism (HCC)    bilateral   Tobacco abuse    Vitamin D deficiency    Past Surgical History:  Procedure Laterality Date   APPENDECTOMY     PARTIAL HIP ARTHROPLASTY Right October 2001   TOTAL HIP ARTHROPLASTY Left June 2000   Social History:  reports that she has been smoking cigarettes. She has never used smokeless tobacco. She reports current alcohol use. She reports current drug use. Drug: Marijuana.  Allergies  Allergen Reactions   Ativan [Lorazepam] Other (See Comments)    Unknown reaction Listed on MAR   Bactrim  [Sulfamethoxazole-Trimethoprim] Other (See Comments)    Unknown reaction  Listed on MAR   Paxil [Paroxetine Hcl] Other (See Comments)    Unknown reaction Listed on MAR    Family History  Problem Relation Age of Onset   Stroke Mother    Stroke Brother    Hypertension Brother    Heart murmur Brother     Prior to Admission medications   Medication Sig Start Date End Date Taking? Authorizing Provider  cholecalciferol (VITAMIN D3) 25 MCG (1000 UNIT) tablet Take 1,000 Units by mouth daily.   Yes [provider]  feeding supplement (BOOST HIGH PROTEIN) LIQD Take 30 mLs by mouth in the morning and at bedtime.   Yes [provider]  fluconazole (DIFLUCAN) 150 MG tablet Take 150 mg by mouth daily. For 14 days.   Yes [provider]  FLUoxetine (PROZAC) 40 MG capsule Take 40 mg by mouth in the morning.   Yes [provider]  gabapentin (NEURONTIN) 600 MG tablet Take 600 mg by mouth every 8 (eight) hours.   Yes [provider]  ibuprofen (ADVIL) 400 MG tablet Take 800 mg by mouth every 6 (six) hours as needed (pain).   Yes [provider]  LACTOBACILLUS PO Take 1 capsule by mouth 2 (two) times daily.   Yes [provider]  loperamide (IMODIUM) 2 MG capsule Take 2 mg by mouth every 6 (six) hours as  needed for diarrhea or loose stools.   Yes [provider]  magnesium oxide (MAG-OX) 400 (240 Mg) MG tablet Take 400 mg by mouth daily.   Yes [provider]  melatonin 3 MG TABS tablet Take 3 mg by mouth at bedtime.   Yes [provider]  midodrine (PROAMATINE) 10 MG tablet Take 1 tablet (10 mg total) by mouth 3 (three) times daily with meals. 11/06/22  Yes Osvaldo Shipper, MD  mirtazapine (REMERON) 7.5 MG tablet Take 7.5 mg by mouth at bedtime. (2100)   Yes [provider]  Nutritional Supplements (NUTRITIONAL DRINK PO) Take 240 mLs by mouth 3 (three) times daily.   Yes [provider]  OXYGEN  Inhale 2 L into the lungs as needed (oxygen level < 90% on room air).   Yes [provider]  Propylene Glycol (SYSTANE COMPLETE) 0.6 % SOLN Place 1 drop into both eyes 2 (two) times daily.   Yes [provider]  senna (SENOKOT) 8.6 MG tablet Take 2 tablets by mouth 2 (two) times daily.   Yes [provider]  tiZANidine (ZANAFLEX) 4 MG tablet Take 4 mg by mouth 3 (three) times daily.   Yes [provider]  traMADol (ULTRAM) 50 MG tablet Take 1 tablet (50 mg total) by mouth every 6 (six) hours as needed. 2 entries on MAR : 50 mg every morning 30 minutes prior to dressing changes + 50 mg every 6 hours as needed for moderate/severe pain Patient taking differently: Take 50 mg by mouth every 6 (six) hours as needed. 2 entries on MAR : Give 50 mg every 24 hours as needed for pain 11/06/22  Yes Osvaldo Shipper, MD  Amino Acids-Protein Hydrolys (PRO-STAT) LIQD Take 30 mLs by mouth 3 (three) times daily.    [provider]  ascorbic acid (VITAMIN C) 500 MG tablet Take 500 mg by mouth in the morning.    [provider]  aspirin 81 MG chewable tablet Chew 81 mg by mouth every evening.    [provider]  Calcium Carb-Cholecalciferol (CALCIUM 600-D) 600-10 MG-MCG TABS Take 1 tablet by mouth in the morning.    [provider]  diclofenac Sodium (VOLTAREN) 1 % GEL Apply 4 g topically every 6 (six) hours as needed (pain). To right hip    [provider]  Zinc Oxide 10 % OINT Apply 1 application  topically 2 (two) times daily.    [provider]    Physical Exam: Vitals:   02/02/23 1625 02/02/23 1729 02/02/23 1929 02/02/23 2035  BP: 112/84  127/71 (!) 146/68  Pulse: 98  (!) 101 92  Resp: 13  14 16   Temp:  98.3 F (36.8 C) 98.5 F (36.9 C) 98.4 F (36.9 C)  TempSrc:  Oral Oral Oral  SpO2: 99%  99% 99%  Weight:      Height:      Patient was examined with RN present, also female technician passion was present for the  entirety of this exam.  Patient was administered morphine and intravenous prior to exam Thin lady there was no distress during initial encounter.  Gives a coherent account of her symptoms.  She has function of her left upper extremity, otherwise does not have function of other extremities Respiratory exam: Bilateral intravesicular Cardiovascular exam S1-S2 normal Abdomen all quadrants are soft nontender Extremities: Patient maintains flexion contracture of all extremities except the left upper extremity.  Specifically the bilateral hips are maintained in 90 degree flexion and  both the knees are touching each other.  Patient was examined after she got morphine intravenous for pain control.  There is blood staining of lower abdomen in the suprapubic region.  It is impossible to pry apart knees to get a good look at patient's groin to figure out the source of bleeding without causing severe pain for the patient.  There is a foul smell.  Rectal area and vaginal area do not seem to be source of bleeding as there is dry powder and nystatin there from the last time that the patient was cleaned completely.  And the bleeding has appeared on the suprapubic area and not elsewhere.  Patient has a sacral ulcer about 2 cm x 2 cm round that appears to be a stage III ulcer with fascia palpable on the base of the ulcer, however the ulcer is undermined and therefore cannot be completely staged.  I did not appreciate any spreading erythema any purulent discharge from the site. Data Reviewed:  Labs on Admission:  Results for orders placed or performed during the hospital encounter of 02/02/23 (from the past 24 hour(s))  Comprehensive metabolic panel     Status: Abnormal   Collection Time: 02/02/23 11:34 AM  Result Value Ref Range   Sodium 136 135 - 145 mmol/L   Potassium 3.6 3.5 - 5.1 mmol/L   Chloride 98 98 - 111 mmol/L   CO2 26 22 - 32 mmol/L   Glucose, Bld 94 70 - 99 mg/dL   BUN 23 (H) 6 - 20 mg/dL   Creatinine,  Ser 0.86 (L) 0.44 - 1.00 mg/dL   Calcium 9.2 8.9 - 57.8 mg/dL   Total Protein 7.1 6.5 - 8.1 g/dL   Albumin 3.4 (L) 3.5 - 5.0 g/dL   AST 22 15 - 41 U/L   ALT 27 0 - 44 U/L   Alkaline Phosphatase 91 38 - 126 U/L   Total Bilirubin 0.5 0.3 - 1.2 mg/dL   GFR, Estimated >46 >96 mL/min   Anion gap 12 5 - 15  CBC with Differential     Status: None   Collection Time: 02/02/23 11:34 AM  Result Value Ref Range   WBC 9.7 4.0 - 10.5 K/uL   RBC 4.79 3.87 - 5.11 MIL/uL   Hemoglobin 13.4 12.0 - 15.0 g/dL   HCT 29.5 28.4 - 13.2 %   MCV 87.5 80.0 - 100.0 fL   MCH 28.0 26.0 - 34.0 pg   MCHC 32.0 30.0 - 36.0 g/dL   RDW 44.0 10.2 - 72.5 %   Platelets 279 150 - 400 K/uL   nRBC 0.0 0.0 - 0.2 %   Neutrophils Relative % 65 %   Neutro Abs 6.3 1.7 - 7.7 K/uL   Lymphocytes Relative 24 %   Lymphs Abs 2.4 0.7 - 4.0 K/uL   Monocytes Relative 9 %   Monocytes Absolute 0.8 0.1 - 1.0 K/uL   Eosinophils Relative 2 %   Eosinophils Absolute 0.2 0.0 - 0.5 K/uL   Basophils Relative 0 %   Basophils Absolute 0.0 0.0 - 0.1 K/uL   Immature Granulocytes 0 %   Abs Immature Granulocytes 0.03 0.00 - 0.07 K/uL  Protime-INR     Status: None   Collection Time: 02/02/23 11:34 AM  Result Value Ref Range   Prothrombin Time 13.6 11.4 - 15.2 seconds   INR 1.0 0.8 - 1.2  APTT     Status: None   Collection Time: 02/02/23 11:34 AM  Result Value Ref Range  aPTT 29 24 - 36 seconds  I-Stat Lactic Acid, ED     Status: None   Collection Time: 02/02/23 11:49 AM  Result Value Ref Range   Lactic Acid, Venous 1.1 0.5 - 1.9 mmol/L  Type and screen [facility]     Status: None (Preliminary result)   Collection Time: 02/02/23  9:06 PM  Result Value Ref Range   ABO/RH(D) PENDING    Antibody Screen PENDING    Sample Expiration      02/05/2023,2359 Performed at [facility], [address] [place], [place] [address]    Basic Metabolic Panel: Recent Labs  Lab 02/02/23 1134  NA 136  K 3.6  CL 98  CO2  26  GLUCOSE 94  BUN 23*  CREATININE 0.36*  CALCIUM 9.2   Liver Function Tests: Recent Labs  Lab 02/02/23 1134  AST 22  ALT 27  ALKPHOS 91  BILITOT 0.5  PROT 7.1  ALBUMIN 3.4*   No results for input(s): "LIPASE", "AMYLASE" in the last 168 hours. No results for input(s): "AMMONIA" in the last 168 hours. CBC: Recent Labs  Lab 02/02/23 1134  WBC 9.7  NEUTROABS 6.3  HGB 13.4  HCT 41.9  MCV 87.5  PLT 279   Cardiac Enzymes: No results for input(s): "CKTOTAL", "CKMB", "CKMBINDEX", "TROPONINIHS" in the last 168 hours.  BNP (last 3 results) No results for input(s): "PROBNP" in the last 8760 hours. CBG: No results for input(s): "GLUCAP" in the last 168 hours.  Radiological Exams on Admission:  CT PELVIS W CONTRAST  Result Date: 02/02/2023 CLINICAL DATA:  Soft tissue infection suspected. Evaluate for necrotizing fasciitis. EXAM: CT PELVIS WITH CONTRAST TECHNIQUE: Multidetector CT imaging of the pelvis was performed using the standard protocol following the bolus administration of intravenous contrast. RADIATION DOSE REDUCTION: This exam was performed according to the departmental dose-optimization program which includes automated exposure control, adjustment of the mA and/or kV according to patient size and/or use of iterative reconstruction technique. CONTRAST:  75mL OMNIPAQUE IOHEXOL 350 MG/ML SOLN COMPARISON:  CT abdomen and pelvis 11/01/2022 FINDINGS: Urinary Tract:  No abnormality visualized. Bowel:  Unremarkable visualized pelvic bowel loops. Vascular/Lymphatic: Atherosclerotic calcifications are present. No acute abnormality. No enlarged lymph nodes. Reproductive:  No mass or other significant abnormality Other:  There is no ascites or focal abdominal wall hernia. Musculoskeletal: There is decubitus ulcer overlying the coccyx with some underlying cortical erosions of the sacrococcygeal region. There is no evidence for soft tissue abscess, foreign body or soft tissue emphysema.  Bilateral hip arthroplasties are present. No acute fracture or hardware loosening identified. IMPRESSION: 1. Decubitus ulcer overlying the coccyx with underlying cortical erosions of the sacrococcygeal region concerning for osteomyelitis. 2. No evidence for soft tissue abscess, foreign body or soft tissue emphysema. Electronically Signed   By: Darliss Cheney M.D.   On: 02/02/2023 17:34    EKG: none today.   Assessment and Plan: Bleeding Patient is having recurrent blood staining of her suprapubic area.  This seems to be far removed from rectal bleeding or vaginal or urethral bleeding.  Which showed no stigmata of bleeding.  Unfortunately the source of bleeding is not possible to be determined at this time due to patient discomfort from her contractures while examining her.  Fortunately the bleeding does not seem to be exsanguinating at this time.  Given the foul smell I suspect there is a infection/ulcer that is present in the groin that is causing the bleeding to happen.  Patient's  blood cultures have been drawn and she has been given empiric vancomycin and ceftriaxone.  Given concern for anaerobic infections, I will will continue with Unasyn at this time.  I will request a surgical evaluation for possible evaluation under general anesthesia in OR to ascertain what is exactly going on. I discussed the case wthi Dr. Axel Filler.  Type and screen sent, I will make the patient n.p.o. past midnight till surgical evaluation is completed  Sacral ulcer (HCC) Due to patient discomfort, this was a limited exam, however this seems to be a stage III ulcer at least the part that is exposed to the skin.  The other part of the ulcer is undermined, and therefore part of the ulcer is not stage able.  I did not appreciate any soft tissue and suspect infection at this time.  And the ulcer appeared mostly clean to me.  I will request wound care evaluation of the site.  I noticed that the CAT scan reports underlying  cortical abnormalities of the bone.  Therefore it is possible patient has underlying osteomyelitis, possibly even infected.  However in this setting of absence of any soft tissue infection, not sure if patient would benefit from continued antibiotic therapy for this indication.  Blood cultures have been drawn and are pending      Advance Care Planning:   Code Status: DNR updated with the patient  Consults: I discussed the case with the surgeon on call, at this time recommendation is that patient first be seen by wound care, if needed wound care team will involve surgery at that time.  Therefore we will proceed accordingly.  Family Communication: Per patient  Severity of Illness: The appropriate patient status for this patient is INPATIENT. Inpatient status is judged to be reasonable and necessary in order to provide the required intensity of service to ensure the patient's safety. The patient's presenting symptoms, physical exam findings, and initial radiographic and laboratory data in the context of their chronic comorbidities is felt to place them at high risk for further clinical deterioration. Furthermore, it is not anticipated that the patient will be medically stable for discharge from the hospital within 2 midnights of admission.   * I certify that at the point of admission it is my clinical judgment that the patient will require inpatient hospital care spanning beyond 2 midnights from the point of admission due to high intensity of service, high risk for further deterioration and high frequency of surveillance required.*  Author: Nolberto Hanlon, MD 02/02/2023 9:48 PM  For on call review [email or web address].

## 2023-02-02 NOTE — ED Notes (Signed)
Pt gone to CT 

## 2023-02-02 NOTE — ED Notes (Signed)
Transport called.

## 2023-02-02 NOTE — Progress Notes (Signed)
Pharmacy Antibiotic Note  [name] [name] is a 62 y.o. female admitted on 02/02/2023 with sacral ulcer.  Pharmacy has been consulted for Unasyn dosing.  Plan: Unasyn 3g IV q6h Follow up renal function, cultures as available, clinical progress, length of tx  Height: 5' 7.5" (171.5 cm) Weight: 54.4 kg (120 lb) IBW/kg (Calculated) : 62.75  Temp (24hrs), Avg:98.3 F (36.8 C), Min:98 F (36.7 C), Max:98.5 F (36.9 C)  Recent Labs  Lab 02/02/23 1134 02/02/23 1149  WBC 9.7  --   CREATININE 0.36*  --   LATICACIDVEN  --  1.1    Estimated Creatinine Clearance: 64.2 mL/min (A) (by C-G formula based on SCr of 0.36 mg/dL (L)).    Allergies  Allergen Reactions   Ativan [Lorazepam] Other (See Comments)    Unknown reaction Listed on MAR   Bactrim [Sulfamethoxazole-Trimethoprim] Other (See Comments)    Unknown reaction  Listed on MAR   Paxil [Paroxetine Hcl] Other (See Comments)    Unknown reaction Listed on MAR    Antimicrobials this admission: 8/9 Unasyn >>  Dose adjustments this admission:  Microbiology results: 8/9 BCx:  Thank you for allowing pharmacy to be a part of this patient's care.  Loralee Pacas, PharmD, BCPS 02/02/2023 10:14 PM  Please check AMION for all [facility] Pharmacy phone numbers After 10:00 PM, call Main Pharmacy [phone]

## 2023-02-02 NOTE — Assessment & Plan Note (Signed)
Due to patient discomfort, this was a limited exam, however this seems to be a stage III ulcer at least the part that is exposed to the skin.  The other part of the ulcer is undermined, and therefore part of the ulcer is not stage able.  I did not appreciate any soft tissue and suspect infection at this time.  And the ulcer appeared mostly clean to me.  I will request wound care evaluation of the site.  I noticed that the CAT scan reports underlying cortical abnormalities of the bone.  Therefore it is possible patient has underlying osteomyelitis, possibly even infected.  However in this setting of absence of any soft tissue infection, not sure if patient would benefit from continued antibiotic therapy for this indication.  Blood cultures have been drawn and are pending

## 2023-02-02 NOTE — ED Triage Notes (Signed)
Patient presents to ed via [facility] states she has an wound in her right groin that started bleeding yest. C/o wound to her tailbone. Patient is contracted. From previous stroke.

## 2023-02-02 NOTE — ED Provider Notes (Signed)
[place] EMERGENCY DEPARTMENT AT [facility] Provider Note   CSN: [id number] Arrival date & time: 02/02/23  4098     History  Chief Complaint  Patient presents with   Wound Infection    [name] [name] is a 62 y.o. female.  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  Patient tells me she has been having bleeding for weeks in this area.  Seem to be worse this morning.  No fevers to her knowledge. No new pain.  To staff at her facility [facility].  She states the wound nurse tried to cleanse her groin but then it was actively bleeding.  It was not bleeding yesterday.  Due to the bleeding and the inability to see where exactly the bleeding was coming from, the nurse practitioner sent her to the ER.    Home Medications Prior to Admission medications   Medication Sig Start Date End Date Taking? Authorizing Provider  cholecalciferol (VITAMIN D3) 25 MCG (1000 UNIT) tablet Take 1,000 Units by mouth daily.   Yes [provider]  feeding supplement (BOOST HIGH PROTEIN) LIQD Take 30 mLs by mouth in the morning and at bedtime.   Yes [provider]  fluconazole (DIFLUCAN) 150 MG tablet Take 150 mg by mouth daily. For 14 days.   Yes [provider]  FLUoxetine (PROZAC) 40 MG capsule Take 40 mg by mouth in the morning.   Yes [provider]  gabapentin (NEURONTIN) 600 MG tablet Take 600 mg by mouth every 8 (eight) hours.   Yes [provider]  ibuprofen (ADVIL) 400 MG tablet Take 800 mg by mouth every 6 (six) hours as needed (pain).   Yes [provider]  LACTOBACILLUS PO Take 1 capsule by mouth 2 (two) times daily.   Yes [provider]  loperamide (IMODIUM) 2 MG capsule Take 2 mg by mouth every 6 (six) hours as needed for diarrhea or  loose stools.   Yes [provider]  magnesium oxide (MAG-OX) 400 (240 Mg) MG tablet Take 400 mg by mouth daily.   Yes [provider]  melatonin 3 MG TABS tablet Take 3 mg by mouth at bedtime.   Yes [provider]  midodrine (PROAMATINE) 10 MG tablet Take 1 tablet (10 mg total) by mouth 3 (three) times daily with meals. 11/06/22  Yes Osvaldo Shipper, MD  mirtazapine (REMERON) 7.5 MG tablet Take 7.5 mg by mouth at bedtime. (2100)   Yes [provider]  Nutritional Supplements (NUTRITIONAL DRINK PO) Take 240 mLs by mouth 3 (three) times daily.   Yes [provider]  OXYGEN Inhale 2 L into the lungs as needed (oxygen level < 90% on room air).   Yes [provider]  Propylene Glycol (SYSTANE COMPLETE) 0.6 % SOLN Place 1 drop into both eyes 2 (two) times daily.   Yes [provider]  senna (SENOKOT) 8.6 MG tablet Take 2 tablets by mouth 2 (two) times daily.   Yes [provider]  tiZANidine (ZANAFLEX) 4 MG tablet Take 4 mg by mouth 3 (three) times daily.   Yes [provider]  traMADol (ULTRAM) 50 MG tablet Take 1 tablet (50 mg total) by mouth every 6 (six) hours as needed. 2 entries on MAR : 50 mg every morning 30 minutes prior to dressing changes +  50 mg every 6 hours as needed for moderate/severe pain Patient taking differently: Take 50 mg by mouth every 6 (six) hours as needed. 2 entries on MAR : Give 50 mg every 24 hours as needed for pain 11/06/22  Yes Osvaldo Shipper, MD  Amino Acids-Protein Hydrolys (PRO-STAT) LIQD Take 30 mLs by mouth 3 (three) times daily.    [provider]  ascorbic acid (VITAMIN C) 500 MG tablet Take 500 mg by mouth in the morning.    [provider]  aspirin 81 MG chewable tablet Chew 81 mg by mouth every evening.    [provider]  Calcium Carb-Cholecalciferol (CALCIUM 600-D) 600-10 MG-MCG TABS Take 1 tablet by mouth in the morning.    [provider]   diclofenac Sodium (VOLTAREN) 1 % GEL Apply 4 g topically every 6 (six) hours as needed (pain). To right hip    [provider]  Zinc Oxide 10 % OINT Apply 1 application  topically 2 (two) times daily.    [provider]      Allergies    Ativan [lorazepam], Bactrim [sulfamethoxazole-trimethoprim], and Paxil [paroxetine hcl]    Review of Systems   Review of Systems  Constitutional:  Negative for fever.  Skin:  Positive for wound.    Physical Exam Updated Vital Signs BP 112/84   Pulse 98   Temp 98.3 F (36.8 C) (Oral)   Resp 13   Ht 5' 7.5" (1.715 m)   Wt 54.4 kg   SpO2 99%   BMI 18.52 kg/m  Physical Exam Vitals and nursing note reviewed. Exam conducted with a chaperone present.  Constitutional:      Appearance: She is well-developed.  HENT:     Head: Normocephalic and atraumatic.  Cardiovascular:     Rate and Rhythm: Normal rate and regular rhythm.     Heart sounds: Normal heart sounds.  Pulmonary:     Effort: Pulmonary effort is normal.     Breath sounds: Normal breath sounds.  Abdominal:     General: There is no distension.     Palpations: Abdomen is soft.     Tenderness: There is no abdominal tenderness.  Skin:    General: Skin is warm and dry.          Comments: Unfortunately there is a very limited view of her right groin/pelvis.  Her right hip is so internally rotated and contracted that is very difficult to separate this from the skin.  However there does appear to be some pink skin, no clear discharge.  There is a little bit of blood in the area but no active bleeding that I can tell.  The skin seems consistent with a fungal infection.  Neurological:     Mental Status: She is alert.     Comments: Severe contractures to bilateral legs     ED Results / Procedures / Treatments   Labs (all labs ordered are listed, but only abnormal results are displayed) Labs Reviewed  COMPREHENSIVE METABOLIC PANEL - Abnormal; Notable for the following  components:      Result Value   BUN 23 (*)    Creatinine, Ser 0.36 (*)    Albumin 3.4 (*)    All other components within normal limits  CULTURE, BLOOD (ROUTINE X 2)  CULTURE, BLOOD (ROUTINE X 2)  CBC WITH DIFFERENTIAL/PLATELET  PROTIME-INR  APTT  I-STAT CG4 LACTIC ACID, ED    EKG None  Radiology No results found.  Procedures Procedures  Medications Ordered in ED Medications  morphine (PF) 4 MG/ML injection 4 mg (has no administration in time range)  ondansetron (ZOFRAN) injection 4 mg (has no administration in time range)  sodium chloride 0.9 % bolus 500 mL (has no administration in time range)  morphine (PF) 4 MG/ML injection 4 mg (4 mg Intravenous Given 02/02/23 1121)  iohexol (OMNIPAQUE) 350 MG/ML injection 75 mL (75 mLs Intravenous Contrast Given 02/02/23 1654)    ED Course/ Medical Decision Making/ A&P                                 Medical Decision Making Amount and/or Complexity of Data Reviewed Labs: ordered.    Details: Normal WBC/hemoglobin Radiology: ordered.  Risk Prescription drug management.   Due to her contractures, very difficult to get a good exam.  She is afebrile with normal vitals.  I suspect she has either a cellulitis or more likely a fungal infection.  There is a small amount of bleeding but no significant acute bleeding or heavy blood loss.  At this point we will get CT.  This is unremarkable I think it be reasonable to treat for possible cellulitis and/or fungal infection.  Care transferred to Dr. Wallace Cullens.        Final Clinical Impression(s) / ED Diagnoses Final diagnoses:  None    Rx / DC Orders ED Discharge Orders     None         Pricilla Loveless, MD 02/02/23 1701

## 2023-02-02 NOTE — ED Provider Notes (Signed)
  Provider Note MRN:  [id number]  Arrival date & time: 02/02/23    ED Course and Medical Decision Making  Assumed care from Dr Criss Alvine at shift change.  See note from prior team for complete details, in brief:  62 yo female Prior cva Bad contractures, bed ridden Superficial irritation between contractures Imaging pending Can likely go home to wound care if negative  Plan per prior physician f/u imaging  CT pelvis resulted, concerning for osteomyelitis Worsened compared to prior CT 5/8 She is not septic Has sig pain to sacral area and wound present on exam Start abx Sacral osteomyelitis and also concern for possible cellulitis to groin/inner thigh c/w contracture Nystatin topical Plan admission Pain control Spoke with Dr Maryjean Ka who accepts pt for admit [facility])     .Critical Care  Performed by: Sloan Leiter, DO Authorized by: Sloan Leiter, DO   Critical care provider statement:    Critical care time (minutes):  32   Critical care time was exclusive of:  Teaching time   Critical care was time spent personally by me on the following activities:  Development of treatment plan with patient or surrogate, discussions with consultants, evaluation of patient's response to treatment, examination of patient, ordering and review of laboratory studies, ordering and review of radiographic studies, ordering and performing treatments and interventions, pulse oximetry, re-evaluation of patient's condition, review of old charts and obtaining history from patient or surrogate   Care discussed with: admitting provider   Comments:     Osteomyelitis of sacrum/coccyx, broad spectrum abx. Intractable pain    Final Clinical Impressions(s) / ED Diagnoses     ICD-10-CM   1. Acute osteomyelitis of sacrum (HCC)  M46.28     2. Intractable pain  R52     3. Candida infection  B37.9       ED Discharge Orders     None       Discharge Instructions   None        Sloan Leiter,  DO 02/02/23 1847

## 2023-02-02 NOTE — Progress Notes (Signed)
ED Pharmacy Antibiotic Sign Off An antibiotic consult was received from an ED provider for vancomycin per pharmacy dosing for  osteomyelitis . A chart review was completed to assess appropriateness.  A single dose of ceftriaxone was placed by the ED provider.   The following one time order(s) were placed per pharmacy consult:  vancomycin 1250 mg x 1 dose  Further antibiotic and/or antibiotic pharmacy consults should be ordered by the admitting provider if indicated.   Thank you for allowing pharmacy to be a part of this patient's care.   Delmar Landau, PharmD, BCPS 02/02/2023 6:20 PM ED Clinical Pharmacist -  [phone]

## 2023-02-03 DIAGNOSIS — M4628 Osteomyelitis of vertebra, sacral and sacrococcygeal region: Secondary | ICD-10-CM | POA: Diagnosis not present

## 2023-02-03 MED ORDER — ADULT MULTIVITAMIN W/MINERALS CH
1.0000 | ORAL_TABLET | Freq: Every day | ORAL | Status: DC
  Administered 2023-02-03 – 2023-02-06 (×4): 1 via ORAL
  Filled 2023-02-03 (×4): qty 1

## 2023-02-03 MED ORDER — VITAMIN C 500 MG PO TABS
500.0000 mg | ORAL_TABLET | Freq: Every day | ORAL | Status: DC
  Administered 2023-02-03 – 2023-02-06 (×4): 500 mg via ORAL
  Filled 2023-02-03 (×4): qty 1

## 2023-02-03 MED ORDER — ENSURE ENLIVE PO LIQD
237.0000 mL | Freq: Two times a day (BID) | ORAL | Status: DC
  Administered 2023-02-03 – 2023-02-06 (×6): 237 mL via ORAL

## 2023-02-03 MED ORDER — ZINC SULFATE 220 (50 ZN) MG PO CAPS
220.0000 mg | ORAL_CAPSULE | Freq: Every day | ORAL | Status: DC
  Administered 2023-02-03 – 2023-02-06 (×4): 220 mg via ORAL
  Filled 2023-02-03 (×4): qty 1

## 2023-02-03 MED ORDER — JUVEN PO PACK
1.0000 | PACK | Freq: Two times a day (BID) | ORAL | Status: DC
  Administered 2023-02-03 – 2023-02-06 (×6): 1 via ORAL
  Filled 2023-02-03 (×7): qty 1

## 2023-02-03 MED ORDER — CYCLOBENZAPRINE HCL 5 MG PO TABS
5.0000 mg | ORAL_TABLET | Freq: Three times a day (TID) | ORAL | Status: DC | PRN
  Administered 2023-02-04 (×2): 5 mg via ORAL
  Filled 2023-02-03 (×2): qty 1

## 2023-02-03 NOTE — Plan of Care (Signed)
  Problem: Clinical Measurements: Goal: Respiratory complications will improve Outcome: Progressing Goal: Cardiovascular complication will be avoided Outcome: Progressing   Problem: Elimination: Goal: Will not experience complications related to bowel motility Outcome: Progressing Goal: Will not experience complications related to urinary retention Outcome: Progressing

## 2023-02-03 NOTE — Progress Notes (Signed)
TR[facility] night cross cover note:   I was notified by RN that this patient has a history of contractures and is requesting a muscle relaxant.  I subsequent placed order for as needed Flexeril.     Newton Pigg, DO Hospitalist

## 2023-02-03 NOTE — Progress Notes (Signed)
Initial Nutrition Assessment  DOCUMENTATION CODES:   Not applicable  INTERVENTION:  Continue regular diet as ordered Ensure Enlive po BID, each supplement provides 350 kcal and 20 grams of protein. Juven BID to support wound healing Vitamin C 500mg  daily  Zinc 220mg  x14 days  MVI with minerals daily  NUTRITION DIAGNOSIS:   Increased nutrient needs related to wound healing as evidenced by estimated needs.  GOAL:   Patient will meet greater than or equal to 90% of their needs  MONITOR:   PO intake, Supplement acceptance, Labs, Weight trends, Skin  REASON FOR ASSESSMENT:   Consult Assessment of nutrition requirement/status  ASSESSMENT:   Pt admitted from SNF with groin bleeding and likely wound infection. PMH significant for CVA, bipolar disorder, PE, previous cellulitis of R groin and osteomyelitis/chronic wound to sacrum, severe contractures.  Pending WOC evaluation. Per MD evaluation, sacral ulcer likely at least stage III  RD working remotely. Unsuccessful attempt to reach pt via phone call to room. Pt has not been assessed by inpatient RD since 10/2021.   No current documented meal completions on file to review at this time. Will place order for nutrition supplements to support increased nutritional needs and wound healing.   Reviewed weight history on file. Pt noted to have a 9.8% weight loss within the last 3 months which is clinically significant for time frame.   Medications: acidophillus, Vitamin D3,  melatonin, remeron, senna Drips: NaCl @ 12ml/hr, abx  Labs: potassium 3.2  NUTRITION - FOCUSED PHYSICAL EXAM: RD working remotely. Deferred to follow up.   Diet Order:   Diet Order             Diet regular Room service appropriate? Yes; Fluid consistency: Thin  Diet effective now                   EDUCATION NEEDS:   No education needs have been identified at this time  Skin:  Skin Assessment: Reviewed RN Assessment  Last BM:   PTA/unknown  Height:   Ht Readings from Last 1 Encounters:  02/02/23 5' 7.5" (1.715 m)    Weight:   Wt Readings from Last 1 Encounters:  02/02/23 54.4 kg   BMI:  Body mass index is 18.52 kg/m.  Estimated Nutritional Needs:   Kcal:  1500-1700  Protein:  80-95g  Fluid:  >/=1.5L  Drusilla Kanner, RDN, LDN Clinical Nutrition

## 2023-02-03 NOTE — Progress Notes (Signed)
**Note De-Identified vi Obfusction** PROGRESS NOTE    [name] [name]  WNU:[id number] DOB: Februry 26, 1962 DOA: 02/02/2023 PCP: Pcp, No  Chief Complint  Ptient presents with   Wound Infection    Brief Nrrtive:   [name] [name] is  62 y.o. 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  Assessment & Pln:   Active Problems:   Scrl ulcer (HCC)   Bleeding  Suspected Right Inguinl Wound with Possible Infection She ws dmitted with noted bleeding Difficult exm given her significnt contrcture to the RLE which folds cross her pelvis - not ble to clerly see wht is bleeding or if there is  wound Of note, seems to be chronic issue.  Cellulitis of R groin noted on dischrge from Dec 2023 nd 10/2022.  December 2023 she ws treted for infection of the right groin tht on photos seems to hve some bleeding s well  Cse ws discussed with generl surgery regrding possibility of exm under nesthesi - Dr. Derrell Lolling nd Dr. Doylene Cnrd recommended wound cre c/s nd reconsult surgery if needed.   Will follow wound cre recs, I doubt they'll be ble to meningfully exmine this re of interested Discussed with rdiology over phone, they noted limittions due to hrdwre in re, but possible skin thickening noted Will tret with ntibiotics, I think gols of cre discussions re importnt for her s I think this my be  recurrent issue with limited options.  Scrl Decubitus Ulcer Scrl Osteomyelitis Wound cre c/s Completed 6 weeks bx on 12/21/2021 As bove, I think gols of cre should be discussed  Hypotension Continue midodrine  Depression SSRI  History of Stroke Aspirin pprently not on her MAR? Will review   Chronic Pin Gbpentin Znflex       DVT prophylxis: SCD Code Sttus: DNR Fmily Communiction: none  Disposition:    Sttus is: Inptient Remins inptient pproprite becuse: need for inptient cre   Consultnts:  surgery  Procedures:  none  Antimicrobils:  Anti-infectives (From dmission, onwrd)    Strt     Dose/Rte Route Frequency Ordered Stop   02/03/23 1000  fluconzole (DIFLUCAN) tblet 150 mg       Note to Phrmcy: For 14 dys.     150 mg Orl Dily 02/02/23 2212 02/07/23 0959   02/03/23 0000  Ampicillin-Sulbctm (UNASYN) 3 g in sodium chloride 0.9 % 100 mL IVPB        3 g 200 mL/hr over 30 Minutes Intrvenous Every 6 hours 02/02/23 2214     02/02/23 1830  vncomycin (VANCOREADY) IVPB 1250 mg/250 mL        1,250 mg 166.7 mL/hr over 90 Minutes Intrvenous  Once 02/02/23 1820 02/02/23 2306   02/02/23 1815  cefTRIAXone (ROCEPHIN) 2 g in sodium chloride 0.9 % 100 mL IVPB        2 g 200 mL/hr over 30 Minutes Intrvenous  Once 02/02/23 1811 02/02/23 1927       Subjective: No new complints  Objective: Vitls:   02/02/23 1929 02/02/23 2035 02/03/23 0456 02/03/23 0917  BP: 127/71 (!) 146/68 114/63 139/88  Pulse: (!) 101 92 69 72  Resp: 14 16 16 18   Temp: 98.5 F (36.9 C) 98.4 F (36.9 C) (!) 97.5 F (36.4 C) 98.4 F (36.9 C)  TempSrc: Orl Orl Orl   SpO2: 99% 99% (!) 86% 98%  Weight:  Height:        Intake/Output Summary (Last 24 hours) at 02/03/2023 1601 Last data filed at 02/03/2023 1521 Gross per 24 hour  Intake 1588.95 ml  Output 0 ml  Net 1588.95 ml   Filed Weights   02/02/23 0918  Weight: 54.4 kg    Examination:  General exam: Appears calm and comfortable  Respiratory system: unlabored Cardiovascular system: RRR Gastrointestinal system:  Significant contracture of RLE limiting view of R inguinal region Extremities: no LEE    Data Reviewed: I have personally reviewed following labs and imaging studies  CBC: Recent Labs  Lab 02/02/23 1134 02/02/23 2109 02/03/23 0117  WBC 9.7 9.8 6.9  NEUTROABS 6.3  --   --   HGB 13.4 12.7 11.8*   HCT 41.9 40.0 37.3  MCV 87.5 88.3 89.0  PLT 279 295 253    Basic Metabolic Panel: Recent Labs  Lab 02/02/23 1134 02/03/23 0117  NA 136 139  K 3.6 3.2*  CL 98 99  CO2 26 25  GLUCOSE 94 108*  BUN 23* 14  CREATININE 0.36* 0.46  CALCIUM 9.2 8.7*    GFR: Estimated Creatinine Clearance: 64.2 mL/min (by C-G formula based on SCr of 0.46 mg/dL).  Liver Function Tests: Recent Labs  Lab 02/02/23 1134  AST 22  ALT 27  ALKPHOS 91  BILITOT 0.5  PROT 7.1  ALBUMIN 3.4*    CBG: No results for input(s): "GLUCAP" in the last 168 hours.   Recent Results (from the past 240 hour(s))  Blood Culture (routine x 2)     Status: None (Preliminary result)   Collection Time: 02/02/23 11:33 AM   Specimen: BLOOD LEFT HAND  Result Value Ref Range Status   Specimen Description BLOOD LEFT HAND  Final   Special Requests   Final    BOTTLES DRAWN AEROBIC AND ANAEROBIC Blood Culture adequate volume   Culture   Final    NO GROWTH < 24 HOURS Performed at [facility], [address], [place], [place] [address]    Report Status PENDING  Incomplete  Blood Culture (routine x 2)     Status: None (Preliminary result)   Collection Time: 02/02/23 11:38 AM   Specimen: BLOOD LEFT HAND  Result Value Ref Range Status   Specimen Description BLOOD LEFT HAND  Final   Special Requests   Final    BOTTLES DRAWN AEROBIC AND ANAEROBIC Blood Culture adequate volume   Culture   Final    NO GROWTH < 24 HOURS Performed at [facility], [address] [place], [place] [address]    Report Status PENDING  Incomplete         Radiology Studies: CT PELVIS W CONTRAST  Result Date: 02/02/2023 CLINICAL DATA:  Soft tissue infection suspected. Evaluate for necrotizing fasciitis. EXAM: CT PELVIS WITH CONTRAST TECHNIQUE: Multidetector CT imaging of the pelvis was performed using the standard protocol following the bolus administration of intravenous contrast. RADIATION DOSE REDUCTION: This exam was  performed according to the departmental dose-optimization program which includes automated exposure control, adjustment of the mA and/or kV according to patient size and/or use of iterative reconstruction technique. CONTRAST:  75mL OMNIPAQUE IOHEXOL 350 MG/ML SOLN COMPARISON:  CT abdomen and pelvis 11/01/2022 FINDINGS: Urinary Tract:  No abnormality visualized. Bowel:  Unremarkable visualized pelvic bowel loops. Vascular/Lymphatic: Atherosclerotic calcifications are present. No acute abnormality. No enlarged lymph nodes. Reproductive:  No mass or other significant abnormality Other:  There is no ascites or focal abdominal wall  hernia. Musculoskeletal: There is decubitus ulcer overlying the coccyx with some underlying cortical erosions of the sacrococcygeal region. There is no evidence for soft tissue abscess, foreign body or soft tissue emphysema. Bilateral hip arthroplasties are present. No acute fracture or hardware loosening identified. IMPRESSION: 1. Decubitus ulcer overlying the coccyx with underlying cortical erosions of the sacrococcygeal region concerning for osteomyelitis. 2. No evidence for soft tissue abscess, foreign body or soft tissue emphysema. Electronically Signed   By: Darliss Cheney M.D.   On: 02/02/2023 17:34        Scheduled Meds:  acidophilus  1 capsule Oral BID   ascorbic acid  500 mg Oral Daily   cholecalciferol  1,000 Units Oral Daily   feeding supplement  237 mL Oral BID BM   fluconazole  150 mg Oral Daily   FLUoxetine  40 mg Oral Daily   gabapentin  600 mg Oral Q8H   melatonin  3 mg Oral QHS   midodrine  10 mg Oral TID WC   mirtazapine  7.5 mg Oral QHS   multivitamin with minerals  1 tablet Oral Daily   nutrition supplement (JUVEN)  1 packet Oral BID BM   polyvinyl alcohol  1 drop Both Eyes BID   senna  2 tablet Oral BID   sodium chloride flush  3 mL Intravenous Q12H   tiZANidine  4 mg Oral TID   zinc sulfate  220 mg Oral Daily   Continuous Infusions:  sodium  chloride 100 mL/hr at 02/03/23 1521   ampicillin-sulbactam (UNASYN) IV Stopped (02/03/23 1253)     LOS: 1 day    Time spent: over 30 min    Lacretia Nicks, MD [facility]   To contact the attending provider between 7A-7P or the covering provider during after hours 7P-7A, please log into the web site [email or web address] and access using universal [place] password for that web site. If you do not have the password, please call the hospital operator.  02/03/2023, 4:01 PM

## 2023-02-04 DIAGNOSIS — B379 Candidiasis, unspecified: Secondary | ICD-10-CM

## 2023-02-04 DIAGNOSIS — R52 Pain, unspecified: Secondary | ICD-10-CM | POA: Diagnosis not present

## 2023-02-04 DIAGNOSIS — R58 Hemorrhage, not elsewhere classified: Secondary | ICD-10-CM

## 2023-02-04 DIAGNOSIS — M4628 Osteomyelitis of vertebra, sacral and sacrococcygeal region: Secondary | ICD-10-CM | POA: Diagnosis not present

## 2023-02-04 LAB — COMPREHENSIVE METABOLIC PANEL WITH GFR
ALT: 22 U/L (ref 0–44)
AST: 16 U/L (ref 15–41)
Albumin: 2.8 g/dL — ABNORMAL LOW (ref 3.5–5.0)
Alkaline Phosphatase: 78 U/L (ref 38–126)
Anion gap: 12 (ref 5–15)
BUN: 13 mg/dL (ref 6–20)
CO2: 23 mmol/L (ref 22–32)
Calcium: 8.4 mg/dL — ABNORMAL LOW (ref 8.9–10.3)
Chloride: 102 mmol/L (ref 98–111)
Creatinine, Ser: 0.39 mg/dL — ABNORMAL LOW (ref 0.44–1.00)
GFR, Estimated: >60 mL/min (ref >60)
Glucose, Bld: 104 mg/dL — ABNORMAL HIGH (ref 70–99)
Potassium: 3.2 mmol/L — ABNORMAL LOW (ref 3.5–5.1)
Sodium: 137 mmol/L (ref 135–145)
Total Bilirubin: 0.6 mg/dL (ref 0.3–1.2)
Total Protein: 5.9 g/dL — ABNORMAL LOW (ref 6.5–8.1)

## 2023-02-04 LAB — CBC WITH DIFFERENTIAL/PLATELET
Abs Immature Granulocytes: 0.05 10*3/uL (ref 0.00–0.07)
Basophils Absolute: 0 10*3/uL (ref 0.0–0.1)
Basophils Relative: 0 %
Eosinophils Absolute: 0.6 10*3/uL — ABNORMAL HIGH (ref 0.0–0.5)
Eosinophils Relative: 4 %
HCT: 38.3 % (ref 36.0–46.0)
Hemoglobin: 12.1 g/dL (ref 12.0–15.0)
Immature Granulocytes: 0 %
Lymphocytes Relative: 13 %
Lymphs Abs: 1.9 10*3/uL (ref 0.7–4.0)
MCH: 28.5 pg (ref 26.0–34.0)
MCHC: 31.6 g/dL (ref 30.0–36.0)
MCV: 90.3 fL (ref 80.0–100.0)
Monocytes Absolute: 0.9 10*3/uL (ref 0.1–1.0)
Monocytes Relative: 6 %
Neutro Abs: 11.2 10*3/uL — ABNORMAL HIGH (ref 1.7–7.7)
Neutrophils Relative %: 77 %
Platelets: 257 10*3/uL (ref 150–400)
RBC: 4.24 MIL/uL (ref 3.87–5.11)
RDW: 15.7 % — ABNORMAL HIGH (ref 11.5–15.5)
WBC: 14.7 10*3/uL — ABNORMAL HIGH (ref 4.0–10.5)
nRBC: 0 % (ref 0.0–0.2)

## 2023-02-04 LAB — C-REACTIVE PROTEIN: CRP: 2.7 mg/dL — ABNORMAL HIGH (ref <1.0)

## 2023-02-04 LAB — PHOSPHORUS: Phosphorus: 2.6 mg/dL (ref 2.5–4.6)

## 2023-02-04 LAB — MAGNESIUM: Magnesium: 1.5 mg/dL — ABNORMAL LOW (ref 1.7–2.4)

## 2023-02-04 LAB — GLUCOSE, CAPILLARY: Glucose-Capillary: 114 mg/dL — ABNORMAL HIGH (ref 70–99)

## 2023-02-04 LAB — SEDIMENTATION RATE: Sed Rate: 21 mm/h (ref 0–22)

## 2023-02-04 MED ORDER — POTASSIUM CHLORIDE CRYS ER 20 MEQ PO TBCR
40.0000 meq | EXTENDED_RELEASE_TABLET | ORAL | Status: AC
  Administered 2023-02-04 (×2): 40 meq via ORAL
  Filled 2023-02-04 (×2): qty 2

## 2023-02-04 MED ORDER — SODIUM CHLORIDE 0.9 % IV BOLUS
1000.0000 mL | Freq: Once | INTRAVENOUS | Status: AC
  Administered 2023-02-04: 1000 mL via INTRAVENOUS

## 2023-02-04 MED ORDER — ACETAMINOPHEN 325 MG PO TABS: 650.0000 mg | ORAL_TABLET | Freq: Four times a day (QID) | ORAL | Status: DC | PRN

## 2023-02-04 MED ORDER — MAGNESIUM SULFATE 4 GM/100ML IV SOLN
4.0000 g | Freq: Once | INTRAVENOUS | Status: AC
  Administered 2023-02-04: 4 g via INTRAVENOUS
  Filled 2023-02-04: qty 100

## 2023-02-04 MED ORDER — ACETAMINOPHEN 500 MG PO TABS
1000.0000 mg | ORAL_TABLET | Freq: Three times a day (TID) | ORAL | Status: DC
  Administered 2023-02-04 – 2023-02-06 (×7): 1000 mg via ORAL
  Filled 2023-02-04 (×8): qty 2

## 2023-02-04 MED ORDER — ONDANSETRON 4 MG PO TBDP: 4.0000 mg | ORAL_TABLET | Freq: Three times a day (TID) | ORAL | Status: DC | PRN

## 2023-02-04 MED ORDER — OXYCODONE HCL 5 MG PO TABS: 5.0000 mg | ORAL_TABLET | ORAL | Status: DC | PRN

## 2023-02-04 NOTE — Progress Notes (Signed)
Patient refuses mobility because is contracted and can only lay in one position when moved has severe pain

## 2023-02-04 NOTE — Significant Event (Signed)
Rapid Response Event Note   Reason for Call :  Decreased LOC, RR, and BP.  Initial Focused Assessment:  Pt lying in bed with eyes closed. She will wake to voice, say her name, and follow commands. She goes back to sleep easily with no stimulation. Lung CTA. Skin warm to touch.   T-98.7, HR-57, BP-86/53, RR-10, SpO2-96% on RA.    Interventions:  1L NS over 2 hours Hold PO meds/pain medication while pt with decreased LOC [id number] Plan of Care:  Pt is arousable to voice. Her breathing is slow but Spo2 is stable on RA. D/t ongoing pain issues, plan is to tx BP with fluid instead of giving narcan at this time. Pt may require narcan if no improvement with IVF bolus. Plan formulated/discussed with Dr. Arlean Hopping. Please call RRT if further assistance needed.   Event Summary:   MD Notified: Dr. Arlean Hopping Call [phone] Arrival Time:2308 End Time:2330  Terrilyn Saver, RN

## 2023-02-04 NOTE — TOC Initial Note (Signed)
Transition of Care [facility]) - Initial/Assessment Note    Patient Details  Name: [name] [name] MRN: [id number] Date of Birth: 1960-12-25  Transition of Care [facility]) CM/SW Contact:    Deatra Robinson, LCSW Phone Number: 02/04/2023, 3:15 PM  Clinical Narrative: Pt admitted from [facility] where she is a LTC resident and is able to return at dc. Notified [name] with [employer]. SW will follow.                   Expected Discharge Plan: Skilled Nursing Facility Barriers to Discharge: Continued Medical Work up   Patient Goals and CMS Choice            Expected Discharge Plan and Services       Living arrangements for the past 2 months: Skilled Nursing Facility                                      Prior Living Arrangements/Services Living arrangements for the past 2 months: Skilled Nursing Facility Lives with:: Facility Resident                   Activities of Daily Living      Permission Sought/Granted                  Emotional Assessment       Orientation: : Oriented to Self, Oriented to Place, Oriented to  Time, Oriented to Situation Alcohol / Substance Use: Not Applicable Psych Involvement: No (comment)  Admission diagnosis:  Osteomyelitis (HCC) [M86.9] Candida infection [B37.9] Intractable pain [R52] Acute osteomyelitis of sacrum (HCC) [M46.28] Patient Active Problem List   Diagnosis Date Noted   Osteomyelitis (HCC) 02/02/2023   Sacral ulcer (HCC) 02/02/2023   Bleeding 02/02/2023   Cellulitis of right groin 11/01/2022   Cellulitis and wound of the groin regeion 06/03/2022   Intractable pain 06/03/2022   History of depression 06/03/2022   Acute metabolic encephalopathy 06/03/2022   Malnutrition of moderate degree 11/11/2021   Anemia 11/09/2021   Decubitus ulcer of sacral region 11/09/2021   Acute osteomyelitis of sacrum (HCC) 11/08/2021   Altered mental status, unspecified 11/21/2017   Tobacco abuse 11/21/2017    Alcohol abuse 11/21/2017   History of CVA (cerebrovascular accident) 11/21/2017   Acute lower UTI 11/21/2017   Female stress incontinence 10/10/2012   Urinary frequency 10/10/2012   Urinary tract infection, site not specified 09/26/2012   PCP:  Pcp, No Pharmacy:   [facility] - [place], [place] - [address] [place] [address] Phone: [phone] Fax: [phone]     Social Determinants of Health (SDOH) Social History: SDOH Screenings   Food Insecurity: No Food Insecurity (11/01/2022)  Housing: Low Risk  (11/01/2022)  Transportation Needs: No Transportation Needs (11/01/2022)  Utilities: Not At Risk (11/01/2022)  Depression (PHQ2-9): Low Risk  (12/20/2021)  Tobacco Use: High Risk (02/02/2023)   SDOH Interventions:     Readmission Risk Interventions     No data to display

## 2023-02-04 NOTE — Consult Note (Addendum)
WOC Nurse Consult Note: Reason for Consult:sacral chronic, nonhealing pressure injury, stage 3 Wound type:pressure Pressure Injury POA: Yes/No/NA Measurement:Measured by Bedside RN A. Montez Morita, RN 2cm x 1cm x 1cm Wound bed: red Drainage (amount, consistency, odor) small serous to light yellow Periwound:intact with evidence of previous wound healing Dressing procedure/placement/frequency: Patient is contracted and is being turned from side to side. Bilateral Prevalon boots are provided. Topical care to sacral wound is to be with daily cleansing and filling of defect with silver hydrofiber strip (Aquacel Ag+ Amalia Greenhouse # [id number]). Nursing is instructed to leave a "tail" approximately 2 inches in length protruding from wound to aid in retrieval and to act as a wick. Hydrofiber is to be topped with silicone foam. Medial thighs and inguinal areas with weeping are to be cleansed daily and our house antimicrobial moisture wicking textile, InterDry used to wick away moisture.  WOC nursing team will not follow, but will remain available to this patient, the nursing and medical teams.  Please re-consult if needed.  Thank you for inviting [place] to participate in this patient's Plan of Care.  Ladona Mow, MSN, RN, CNS, GNP, Leda Min, [employer], [employer] phone:  [phone]

## 2023-02-04 NOTE — Progress Notes (Signed)
**Note De-Identified vi Obfusction** PROGRESS NOTE    [name] [name]  WUJ:[id number] DOB: 07-12-1960 DOA: 02/02/2023 PCP: Pcp, No  Chief Complint  Ptient presents with   Wound Infection    Brief Nrrtive:   [name] [name] is  62 y.o. 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  Assessment & Pln:   Active Problems:   Acute osteomyelitis of scrum (HCC)   Scrl ulcer (HCC)   Bleeding  Gols of cre Will c/s pllitive to help discuss goc, this hs been recurrent issue for her it looks like.  Suspected Right Inguinl Wound with Possible Infection Leukocytosis worse tody, will follow  She ws dmitted with noted bleeding Difficult exm given her significnt contrcture to the RLE which folds cross her pelvis - not ble to clerly see wht is bleeding or if there is  wound Of note, seems to be chronic issue.  Cellulitis of R groin noted on dischrge from Dec 2023 nd 10/2022.  December 2023 she ws treted for infection of the right groin tht on photos seems to hve some bleeding s well  Cse ws discussed with generl surgery regrding possibility of exm under nesthesi - Dr. Derrell Lolling nd Dr. Doylene Cnrd recommended wound cre c/s nd reconsult surgery if needed.   Will follow wound cre recs Discussed with rdiology over phone, they noted limittions due to hrdwre in re, but possible skin thickening noted Will tret with ntibiotics, I think gols of cre discussions re importnt for her s I think this my be  recurrent issue with limited options.  Scrl Decubitus Ulcer Scrl Osteomyelitis Wound cre c/s Completed 6 weeks bx on 12/21/2021  Hypotension Continue midodrine  Depression SSRI  History of Stroke Aspirin pprently not on her MAR? Will need to review   Chronic Pin Gbpentin Znflex     DVT prophylxis:  SCD Code Sttus: DNR Fmily Communiction: none  Disposition:   Sttus is: Inptient Remins inptient pproprite becuse: need for inptient cre   Consultnts:  surgery  Procedures:  none  Antimicrobils:  Anti-infectives (From dmission, onwrd)    Strt     Dose/Rte Route Frequency Ordered Stop   02/03/23 1000  fluconzole (DIFLUCAN) tblet 150 mg       Note to Phrmcy: For 14 dys.     150 mg Orl Dily 02/02/23 2212 02/07/23 0959   02/03/23 0000  Ampicillin-Sulbctm (UNASYN) 3 g in sodium chloride 0.9 % 100 mL IVPB        3 g 200 mL/hr over 30 Minutes Intrvenous Every 6 hours 02/02/23 2214     02/02/23 1830  vncomycin (VANCOREADY) IVPB 1250 mg/250 mL        1,250 mg 166.7 mL/hr over 90 Minutes Intrvenous  Once 02/02/23 1820 02/02/23 2306   02/02/23 1815  cefTRIAXone (ROCEPHIN) 2 g in sodium chloride 0.9 % 100 mL IVPB        2 g 200 mL/hr over 30 Minutes Intrvenous  Once 02/02/23 1811 02/02/23 1927       Subjective: No new complints  Objective: Vitls:   02/03/23 0917 02/03/23 2127 02/04/23 0506 02/04/23 0917  BP: 139/88 (!) 140/70 (!) 120/97 (!) 115/90  Pulse: 72 66 98 99  Resp: 18 19 18    Temp: 98.4 F (36.9 C) 98.3 F (36.8 C) 99.1 F (37.3 C) 98.8 F (37.1 C)  TempSrc:  Oral  SpO2: 98% 92% 90% 94%  Weight:      Height:        Intake/Output Summary (Last 24 hours) at 02/04/2023 1349 Last data filed at 02/04/2023 1100 Gross per 24 hour  Intake 2495.35 ml  Output --  Net 2495.35 ml   Filed Weights   02/02/23 0918  Weight: 54.4 kg    Examination:  General: No acute distress. Cardiovascular: RRR Lungs: unlabored Neurological: lert and oriented 3. Moves all extremities 4 with equal strength. Cranial nerves II through XII grossly intact. Extremities: significant LE contractures   Data Reviewed: I have personally reviewed following labs and imaging studies  CBC: Recent Labs  Lab 02/02/23 1134 02/02/23 2109 02/03/23 0117  02/04/23 0344  WBC 9.7 9.8 6.9 14.7*  NEUTROBS 6.3  --   --  11.2*  HGB 13.4 12.7 11.8* 12.1  HCT 41.9 40.0 37.3 38.3  MCV 87.5 88.3 89.0 90.3  PLT 279 295 253 257    Basic Metabolic Panel: Recent Labs  Lab 02/02/23 1134 02/03/23 0117 02/04/23 0344  N 136 139 137  K 3.6 3.2* 3.2*  CL 98 99 102  CO2 26 25 23   GLUCOSE 94 108* 104*  BUN 23* 14 13  CRETININE 0.36* 0.46 0.39*  CLCIUM 9.2 8.7* 8.4*  MG  --   --  1.5*  PHOS  --   --  2.6    GFR: Estimated Creatinine Clearance: 64.2 mL/min () (by C-G formula based on SCr of 0.39 mg/dL (L)).  Liver Function Tests: Recent Labs  Lab 02/02/23 1134 02/04/23 0344  ST 22 16  LT 27 22  LKPHOS 91 78  BILITOT 0.5 0.6  PROT 7.1 5.9*  LBUMIN 3.4* 2.8*    CBG: No results for input(s): "GLUCP" in the last 168 hours.   Recent Results (from the past 240 hour(s))  Blood Culture (routine x 2)     Status: None (Preliminary result)   Collection Time: 02/02/23 11:33 M   Specimen: BLOOD LEFT HND  Result Value Ref Range Status   Specimen Description BLOOD LEFT HND  Final   Special Requests   Final    BOTTLES DRWN EROBIC ND NEROBIC Blood Culture adequate volume   Culture   Final    NO GROWTH < 24 HOURS Performed at [facility], [address] [place], [place] [address]    Report Status PENDING  Incomplete  Blood Culture (routine x 2)     Status: None (Preliminary result)   Collection Time: 02/02/23 11:38 M   Specimen: BLOOD LEFT HND  Result Value Ref Range Status   Specimen Description BLOOD LEFT HND  Final   Special Requests   Final    BOTTLES DRWN EROBIC ND NEROBIC Blood Culture adequate volume   Culture   Final    NO GROWTH < 24 HOURS Performed at [facility], [address], [place], [place] [address]    Report Status PENDING  Incomplete         Radiology Studies: CT PELVIS W CONTRST  Result Date: 02/02/2023 CLINICL DT:  Soft tissue infection suspected. Evaluate for  necrotizing fasciitis. EXM: CT PELVIS WITH CONTRST TECHNIQUE: Multidetector CT imaging of the pelvis was performed using the standard protocol following the bolus administration of intravenous contrast. RDITION DOSE REDUCTION: This exam was performed according to the departmental dose-optimization program which includes automated exposure control, adjustment of the m and/or kV according to patient size and/or use of iterative reconstruction  technique. CONTRAST:  75mL OMNIPAQUE IOHEXOL 350 MG/ML SOLN COMPARISON:  CT abdomen and pelvis 11/01/2022 FINDINGS: Urinary Tract:  No abnormality visualized. Bowel:  Unremarkable visualized pelvic bowel loops. Vascular/Lymphatic: Atherosclerotic calcifications are present. No acute abnormality. No enlarged lymph nodes. Reproductive:  No mass or other significant abnormality Other:  There is no ascites or focal abdominal wall hernia. Musculoskeletal: There is decubitus ulcer overlying the coccyx with some underlying cortical erosions of the sacrococcygeal region. There is no evidence for soft tissue abscess, foreign body or soft tissue emphysema. Bilateral hip arthroplasties are present. No acute fracture or hardware loosening identified. IMPRESSION: 1. Decubitus ulcer overlying the coccyx with underlying cortical erosions of the sacrococcygeal region concerning for osteomyelitis. 2. No evidence for soft tissue abscess, foreign body or soft tissue emphysema. Electronically Signed   By: Darliss Cheney M.D.   On: 02/02/2023 17:34        Scheduled Meds:  acidophilus  1 capsule Oral BID   ascorbic acid  500 mg Oral Daily   cholecalciferol  1,000 Units Oral Daily   feeding supplement  237 mL Oral BID BM   fluconazole  150 mg Oral Daily   FLUoxetine  40 mg Oral Daily   gabapentin  600 mg Oral Q8H   melatonin  3 mg Oral QHS   midodrine  10 mg Oral TID WC   mirtazapine  7.5 mg Oral QHS   multivitamin with minerals  1 tablet Oral Daily   nutrition supplement (JUVEN)   1 packet Oral BID BM   polyvinyl alcohol  1 drop Both Eyes BID   potassium chloride  40 mEq Oral Q4H   senna  2 tablet Oral BID   sodium chloride flush  3 mL Intravenous Q12H   tiZANidine  4 mg Oral TID   zinc sulfate  220 mg Oral Daily   Continuous Infusions:  sodium chloride 100 mL/hr at 02/04/23 0851   ampicillin-sulbactam (UNASYN) IV 3 g (02/04/23 1208)   magnesium sulfate bolus IVPB       LOS: 2 days    Time spent: over 30 min    Lacretia Nicks, MD [facility]   To contact the attending provider between 7A-7P or the covering provider during after hours 7P-7A, please log into the web site [email or web address] and access using universal [place] password for that web site. If you do not have the password, please call the hospital operator.  02/04/2023, 1:49 PM

## 2023-02-04 NOTE — Plan of Care (Signed)
  Problem: Clinical Measurements: Goal: Respiratory complications will improve Outcome: Progressing Goal: Cardiovascular complication will be avoided Outcome: Progressing   Problem: Nutrition: Goal: Adequate nutrition will be maintained Outcome: Progressing   Problem: Elimination: Goal: Will not experience complications related to urinary retention Outcome: Progressing   

## 2023-02-04 NOTE — Consult Note (Signed)
Palliative Medicine Inpatient Consult Note  Reason for consult:  Goals of Care  HPI: [name] [name] is a 62 year old female with past medical history significant for CVA, bipolar disorder, PE, cellulitis of right groin and osteomyelitis/chronic would to her sacrum, and sever contractures.  She was admitted through the ED where she was transported after bleeding of right groin was noted during routine care. Patient states she thinks her brief had been on too tight. Her wound is hard to visualize. She is being treated with antibiotics with a wound care consult.    Clinical Assessment/Goals of Care: I have reviewed medical records including EPIC notes, labs and imaging, received report from bedside RN [name] [name], and assessed the patient.    I met with [name] [name] to further discuss diagnosis prognosis, GOC, EOL wishes, disposition and options. I attempted to reach her brother [name] [name] and [name] [name] without success.  Messages left. The patient makes her own decisions.    I introduced Palliative Medicine as specialized medical care for people living with serious illness. It focuses on providing relief from the symptoms and stress of a serious illness. The goal is to improve quality of life for both the patient and the family.  I asked her to tell me about herself. [name] [name]  was previously married and divorced twice. She has no children. 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 She is of [place] faith and would like out chaplain to visit. She resides at [facility]. She is  able to feed herself but requires care for everything else due to her contractures. She shared she had been taking the transit bus to the store but can no longer push herself in the Wheelchair.    A detailed discussion was had today regarding advanced directives.  Concepts specific to code status, artifical feeding and hydration,  continued IV antibiotics and rehospitalization was had.  The difference between a aggressive medical intervention path  and a palliative comfort care path for this patient at this time was had. Values and goals of care important to patient were attempted to be elicited. She has had previous admission for her wounds. She is followed by Orthopedics on [address].   She does not have an advance directive unrelated to Code status. She confirmed DNR status. She states she would not want to be on a ventilator, have a feeding tube or dialysis.  She states her brother, [name] [name] has been looking at other facilities for her beside [name] when she currently resides. Her current plan is to return there when ready for discharge unless he has successful found other accommodations for her.   Symptoms to manage include Acute on Chronic right side pain, Currently 3/10, states it goes up to 8/10. She ws medicated with Morphine prior to my consult. She states she uses marijuana in a vape pen for pain. One pen lasts 2-3 weeks. Depression is chronic. She has recent insomnia that she usually takes melatonin as needed for. She did not sleep for 2 nights prior to admission. She contributes this to drinking a [place]. Dew soda and feeling like she might have a UTI. She had nausea yesterday, none today. Stopping eating helped yesterday. Emotional stressors include her mother's death in 08-30-2022.   Discussed the importance of continued conversation with family and their  medical providers regarding overall plan of care and treatment options, ensuring decisions are within the context of the  patients values and GOCs.  Decision Maker: Patient  SUMMARY OF RECOMMENDATIONS    Code Status/Advance Care Planning: DNAR/DNI  Symptom Management:  Pain: Morphine 4 mg every 3 hours as needed for sever pain, oxycodone IR 5 mg every 4 hours as needed for moderate pain, Acetaminophen 1000 mg every 8 hours as needed, neurontin 600 mg every 8  hours, Xanaflex 4 mg three times a day for muscle spasms.  Insomnia Melatonin 3 mg at bedtime Nausea:  Palliative Prophylaxis:  Ascorbic acid for wound healing Bowel prophylaxis, Turn every 2 hours while awake, Monitor for wound breakdown,   Additional Recommendations (Limitations, Scope, Preferences): Continue current treatment per patient wishes, no ventilator, have a feeding tube or dialysis Symptom management  Psycho-social/Spiritual:  Desire for further Chaplaincy support: Yes, consult placed for spiritual care   Discharge Planning:  Patient desire to return to LTC, either at [facility] or new place her brother may have found. Review of Systems  Constitutional:  Positive for malaise/fatigue.  HENT: Negative.    Eyes: Negative.   Respiratory: Negative.    Cardiovascular: Negative.   Gastrointestinal:  Positive for nausea.  Genitourinary:  Positive for dysuria and frequency.  Musculoskeletal:  Positive for back pain, joint pain and myalgias.  Skin:        Unable to assess right groin wound due to position and contractures  Neurological:  Positive for weakness.  Endo/Heme/Allergies: Negative.   Psychiatric/Behavioral:  Positive for depression. Negative for suicidal ideas. The patient has insomnia.        02/04/2023    9:17 AM 02/04/2023    5:06 AM 02/03/2023    9:27 PM  Vitals with BMI  Systolic 115 120 161  Diastolic 90 97 70  Pulse 99 98 66    Physical Exam Constitutional:      General: She is not in acute distress.    Appearance: She is ill-appearing.  Cardiovascular:     Rate and Rhythm: Normal rate and regular rhythm.  Pulmonary:     Effort: Pulmonary effort is normal.  Musculoskeletal:        General: Deformity present.     Comments: Contractures right arm, right and left legs  Skin:    General: Skin is warm and dry.     Capillary Refill: Capillary refill takes less than 2 seconds.     Coloration: Skin is pale.  Neurological:     General: No focal  deficit present.     Mental Status: She is alert and oriented to person, place, and time.  Psychiatric:        Mood and Affect: Mood normal.        Behavior: Behavior normal.        Thought Content: Thought content normal.        Judgment: Judgment normal.     PPS: 40%   This conversation/these recommendations were discussed with patient primary care team, Dr. Lowell Guitar vis secure chat.  Thank you for the opportunity to participate in the care of this patient.   Billing based on MDM: High   Problems Addressed: One acute or chronic illness or injury that poses a threat to life or bodily function   Amount and/or Complexity of Data: Category 3:Discussion of management or test interpretation with external physician/other qualified health care professional/appropriate source (not separately reported)   Risks: Decision regarding hospitalization or escalation of hospital care and Decision not to resuscitate or to de-escalate care because of poor prognosis.  Jerrye Bushy, NP [facility] Palliative Medicine Team Team Cell Phone: [phone] Please utilize secure chat with additional questions, if there is no response within 30 minutes please call the above phone number  Palliative Medicine Team providers are available by phone from 7am to 7pm daily and can be reached through the team cell phone.  Should this patient require assistance outside of these hours, please call the patient's attending physician.

## 2023-02-05 DIAGNOSIS — R52 Pain, unspecified: Secondary | ICD-10-CM | POA: Diagnosis not present

## 2023-02-05 DIAGNOSIS — Z515 Encounter for palliative care: Secondary | ICD-10-CM

## 2023-02-05 MED ORDER — OXYCODONE HCL 5 MG PO TABS
5.0000 mg | ORAL_TABLET | ORAL | Status: DC | PRN
  Administered 2023-02-05 – 2023-02-06 (×3): 5 mg via ORAL
  Filled 2023-02-05 (×4): qty 1

## 2023-02-05 MED ORDER — KETOROLAC TROMETHAMINE 15 MG/ML IJ SOLN
15.0000 mg | Freq: Four times a day (QID) | INTRAMUSCULAR | Status: DC
  Administered 2023-02-06 (×4): 15 mg via INTRAVENOUS
  Filled 2023-02-05 (×4): qty 1

## 2023-02-05 MED ORDER — MORPHINE SULFATE (PF) 4 MG/ML IV SOLN: 4.0000 mg | INTRAVENOUS | Status: DC | PRN

## 2023-02-05 MED ORDER — OXYCODONE HCL 5 MG PO TABS: 10.0000 mg | ORAL_TABLET | ORAL | Status: DC | PRN

## 2023-02-05 MED ORDER — MORPHINE SULFATE (PF) 2 MG/ML IV SOLN
2.0000 mg | INTRAVENOUS | Status: DC | PRN
  Administered 2023-02-05 – 2023-02-06 (×5): 2 mg via INTRAVENOUS
  Filled 2023-02-05 (×6): qty 1

## 2023-02-05 MED ORDER — NALOXONE HCL 0.4 MG/ML IJ SOLN
0.4000 mg | Freq: Once | INTRAMUSCULAR | Status: AC
  Administered 2023-02-05: 0.4 mg via INTRAVENOUS
  Filled 2023-02-05: qty 1

## 2023-02-05 NOTE — Progress Notes (Signed)
Chaplain responded to [facility] consult for support. Chaplain knocked on patient door and entered the room. Pt was asleep in his hospital bed. Chaplain called Ms. Drewek' name, but she did not rouse. Team will attempt follow up on Tuesday 8/13. Please page if needs arise prior.  Maryanna Shape. Carley Hammed, M.Div. [facility] Chaplain Pager [phone] Office [phone]

## 2023-02-05 NOTE — TOC Progression Note (Signed)
Transition of Care [facility]) - Initial/Assessment Note    Patient Details  Name: [name] [name] MRN: [id number] Date of Birth: 09-05-1960  Transition [facility]) CM/SW Contact:    Ralene Bathe, LCSW Phone Number: 02/05/2023, 3:12 PM  Clinical Narrative:                 LCSW contacted the patient's brother to follow up on LT SNF and LM requesting a returned call.  TOC following.   Expected Discharge Plan: Skilled Nursing Facility Barriers to Discharge: Continued Medical Work up   Patient Goals and CMS Choice            Expected Discharge Plan and Services       Living arrangements for the past 2 months: Skilled Nursing Facility                                      Prior Living Arrangements/Services Living arrangements for the past 2 months: Skilled Nursing Facility Lives with:: Facility Resident                   Activities of Daily Living Home Assistive Devices/Equipment: None ADL Screening (condition at time of admission) Patient's cognitive ability adequate to safely complete daily activities?: Yes Is the patient deaf or have difficulty hearing?: No Does the patient have difficulty seeing, even when wearing glasses/contacts?: No Does the patient have difficulty concentrating, remembering, or making decisions?: No Patient able to express need for assistance with ADLs?: Yes Does the patient have difficulty dressing or bathing?: Yes Independently performs ADLs?: No Communication: Independent Dressing (OT): Dependent Is this a change from baseline?: Pre-admission baseline Feeding: Independent Bathing: Dependent Is this a change from baseline?: Pre-admission baseline Toileting: Dependent Is this a change from baseline?: Pre-admission baseline In/Out Bed: Dependent Is this a change from baseline?: Pre-admission baseline Walks in Home: Dependent Is this a change from baseline?: Pre-admission baseline Does the patient have difficulty walking or  climbing stairs?: Yes Weakness of Legs: Both Weakness of Arms/Hands: Right  Permission Sought/Granted                  Emotional Assessment       Orientation: : Oriented to Self, Oriented to Place, Oriented to  Time, Oriented to Situation Alcohol / Substance Use: Not Applicable Psych Involvement: No (comment)  Admission diagnosis:  Osteomyelitis (HCC) [M86.9] Candida infection [B37.9] Intractable pain [R52] Acute osteomyelitis of sacrum (HCC) [M46.28] Patient Active Problem List   Diagnosis Date Noted   Osteomyelitis (HCC) 02/02/2023   Sacral ulcer (HCC) 02/02/2023   Bleeding 02/02/2023   Cellulitis of right groin 11/01/2022   Cellulitis and wound of the groin regeion 06/03/2022   Intractable pain 06/03/2022   History of depression 06/03/2022   Acute metabolic encephalopathy 06/03/2022   Malnutrition of moderate degree 11/11/2021   Anemia 11/09/2021   Decubitus ulcer of sacral region 11/09/2021   Acute osteomyelitis of sacrum (HCC) 11/08/2021   Altered mental status, unspecified 11/21/2017   Tobacco abuse 11/21/2017   Alcohol abuse 11/21/2017   History of CVA (cerebrovascular accident) 11/21/2017   Acute lower UTI 11/21/2017   Female stress incontinence 10/10/2012   Urinary frequency 10/10/2012   Urinary tract infection, site not specified 09/26/2012   PCP:  Pcp, No Pharmacy:   [facility] [place], [place] - [address] [place] [address] Phone:  [phone] Fax: [phone]     Social Determinants of Health (SDOH) Social History: SDOH Screenings   Food Insecurity: No Food Insecurity (02/04/2023)  Housing: Low Risk  (02/04/2023)  Transportation Needs: No Transportation Needs (02/04/2023)  Utilities: Not At Risk (02/04/2023)  Depression (PHQ2-9): Low Risk  (12/20/2021)  Tobacco Use: High Risk (02/02/2023)   SDOH Interventions:     Readmission Risk Interventions     No data to display

## 2023-02-05 NOTE — Progress Notes (Signed)
**Note De-Identified vi Obfusction** PROGRESS NOTE    [name] [name]  JXB:[id number] DOB: 08-17-1960 DOA: 02/02/2023 PCP: Pcp, No  Chief Complint  Ptient presents with   Wound Infection    Brief Nrrtive:   [name] [name] is  62 y.o. 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  Cellulitis of R groin noted on dischrge from Dec 2023 nd 10/2022.  December 2023 she ws treted for infection of the right groin tht on photos seems to hve some bleeding s well  Cse ws discussed with generl surgery regrding possibility of exm under nesthesi - Dr. Derrell Lolling nd Dr. Doylene Cnrd recommended wound cre c/s nd reconsult surgery if needed.   Will follow wound cre recs, pprecite ssistnce Discussed with rdiology over phone, they noted limittions due to hrdwre in re, but possible skin thickening noted Will tret with ntibiotics, I think gols of cre discussions re importnt for her s I think this my be  recurrent issue with limited options.  Chronic Pin  Currently getting morphine/oxy Ws oversedted lst night, hypotensive nd with slow RR requiring nrcn Will decrese morphine dose, dd oxy   Scrl Decubitus Ulcer Scrl Osteomyelitis Wound cre c/s Completed 6 weeks bx on 12/21/2021  Hypotension Continue midodrine  Depression SSRI  History of  Stroke Aspirin pprently not on her MAR? Will need to review   Chronic Pin Gbpentin Znflex     DVT prophylxis: SCD Code Sttus: DNR Fmily Communiction: none  Disposition:   Sttus is: Inptient Remins inptient pproprite becuse: need for inptient cre   Consultnts:  surgery  Procedures:  none  Antimicrobils:  Anti-infectives (From dmission, onwrd)    Strt     Dose/Rte Route Frequency Ordered Stop   02/03/23 1000  fluconzole (DIFLUCAN) tblet 150 mg       Note to Phrmcy: For 14 dys.     150 mg Orl Dily 02/02/23 2212 02/07/23 0959   02/03/23 0000  Ampicillin-Sulbctm (UNASYN) 3 g in sodium chloride 0.9 % 100 mL IVPB        3 g 200 mL/hr over 30 Minutes Intrvenous Every 6 hours 02/02/23 2214     02/02/23 1830  vncomycin (VANCOREADY) IVPB 1250 mg/250 mL        1,250 mg 166.7 mL/hr over 90 Minutes Intrvenous  Once 02/02/23 1820 02/02/23 2306   02/02/23 1815  cefTRIAXone (ROCEPHIN) 2 g in sodium chloride 0.9 % 100 mL IVPB        2 g 200 mL/hr over 30 Minutes Intrvenous  Once 02/02/23 1811 02/02/23 1927       Subjective: Denies complint this morning  Objective: Vitls:   02/05/23 0058 02/05/23 0142 02/05/23 0632 02/05/23 0919  BP: 100/63 119/74 (!) 146/77 (!) 146/83  Pulse: (!) 51 70 62 70  Resp: (!) 7 15 15 18   Temp: 98 F (36.7 C) 97.7 F (  36.5 C) 98.1 F (36.7 C) 98 F (36.7 C)  TempSrc: Oral Oral Oral Oral  SpO2: 97% 98% 95% 100%  Weight:      Height:        Intake/Output Summary (Last 24 hours) at 02/05/2023 1040 Last data filed at 02/05/2023 0600 Gross per 24 hour  Intake 2862.95 ml  Output 0 ml  Net 2862.95 ml   Filed Weights   02/02/23 0918  Weight: 54.4 kg    Examination:  General: No acute distress. Cardiovascular: RRR Lungs: unlabored Neurological: lert and oriented 3. Moves all extremities 4 with equal strength. Cranial nerves II through XII grossly intact. Extremities: significant LE  contractures   Data Reviewed: I have personally reviewed following labs and imaging studies  CBC: Recent Labs  Lab 02/02/23 1134 02/02/23 2109 02/03/23 0117 02/04/23 0344 02/05/23 0036  WBC 9.7 9.8 6.9 14.7* 7.5  NEUTROBS 6.3  --   --  11.2*  --   HGB 13.4 12.7 11.8* 12.1 10.4*  HCT 41.9 40.0 37.3 38.3 33.8*  MCV 87.5 88.3 89.0 90.3 88.0  PLT 279 295 253 257 221    Basic Metabolic Panel: Recent Labs  Lab 02/02/23 1134 02/03/23 0117 02/04/23 0344 02/05/23 0036  N 136 139 137 138  K 3.6 3.2* 3.2* 4.7  CL 98 99 102 109  CO2 26 25 23 23   GLUCOSE 94 108* 104* 112*  BUN 23* 14 13 16   CRETININE 0.36* 0.46 0.39* 0.36*  CLCIUM 9.2 8.7* 8.4* 7.7*  MG  --   --  1.5* 2.3  PHOS  --   --  2.6 2.7    GFR: Estimated Creatinine Clearance: 64.2 mL/min () (by C-G formula based on SCr of 0.36 mg/dL (L)).  Liver Function Tests: Recent Labs  Lab 02/02/23 1134 02/04/23 0344  ST 22 16  LT 27 22  LKPHOS 91 78  BILITOT 0.5 0.6  PROT 7.1 5.9*  LBUMIN 3.4* 2.8*    CBG: Recent Labs  Lab 02/04/23 2347  GLUCP 114*     Recent Results (from the past 240 hour(s))  Blood Culture (routine x 2)     Status: None (Preliminary result)   Collection Time: 02/02/23 11:33 M   Specimen: BLOOD LEFT HND  Result Value Ref Range Status   Specimen Description BLOOD LEFT HND  Final   Special Requests   Final    BOTTLES DRWN EROBIC ND NEROBIC Blood Culture adequate volume   Culture   Final    NO GROWTH 3 DYS Performed at [facility], [address] [place], [place] [address]    Report Status PENDING  Incomplete  Blood Culture (routine x 2)     Status: None (Preliminary result)   Collection Time: 02/02/23 11:38 M   Specimen: BLOOD LEFT HND  Result Value Ref Range Status   Specimen Description BLOOD LEFT HND  Final   Special Requests   Final    BOTTLES DRWN EROBIC ND NEROBIC Blood Culture adequate volume   Culture   Final    NO GROWTH 3  DYS Performed at [facility], [address] [place], [place] [address]    Report Status PENDING  Incomplete         Radiology Studies: No results found.      Scheduled Meds:  acetaminophen  1,000 mg Oral Q8H   acidophilus  1 capsule Oral BID   ascorbic acid  500 mg Oral Daily   cholecalciferol  1,000 Units  Oral Daily   feeding supplement  237 mL Oral BID BM   fluconazole  150 mg Oral Daily   FLUoxetine  40 mg Oral Daily   gabapentin  600 mg Oral Q8H   ketorolac  15 mg Intravenous Q6H   melatonin  3 mg Oral QHS   midodrine  10 mg Oral TID WC   mirtazapine  7.5 mg Oral QHS   multivitamin with minerals  1 tablet Oral Daily   nutrition supplement (JUVEN)  1 packet Oral BID BM   polyvinyl alcohol  1 drop Both Eyes BID   senna  2 tablet Oral BID   sodium chloride flush  3 mL Intravenous Q12H   tiZANidine  4 mg Oral TID   zinc sulfate  220 mg Oral Daily   Continuous Infusions:  sodium chloride 100 mL/hr at 02/05/23 1008   ampicillin-sulbactam (UNASYN) IV 3 g (02/05/23 0645)     LOS: 3 days    Time spent: over 30 min    Lacretia Nicks, MD [facility]   To contact the attending provider between 7A-7P or the covering provider during after hours 7P-7A, please log into the web site [email or web address] and access using universal [place] password for that web site. If you do not have the password, please call the hospital operator.  02/05/2023, 10:40 AM

## 2023-02-05 NOTE — Progress Notes (Signed)
TRH night cross cover note:   I was notified by RN that this patient is exhibiting evidence of diminished responsiveness relative to the day shift, when she was noted to be interactive.  She has been receiving IV morphine on a every 3 hour basis for significant pain, and was noted to be sleeping at the beginning of shift.  However, at the time that RN attempted to give the patient her evening medications, she was noted to exhibit evidence of diminished responsiveness.  Respiratory rate 11-12, oxygen saturation 95 to 96% on room air.  Most recent blood pressure 89/59, which appears slightly lower than during dayshift.  She is able to state her name and follow commands.  Given the patient's significant recent pain history and noting that she is maintaining appropriate oxygenation on room air, will hold off from administering Narcan at this time.  Rather, we will focus on her hemodynamics at this time.  To this end, I have ordered normal saline 1 L to be administered over 2 hours.  This is relative to her existing order for continuous NS running at 100 cc/h.  Will continue to closely monitor ensuing hemodynamic/respiratory status. May also consider vbg.     Newton Pigg, DO Hospitalist

## 2023-02-05 NOTE — Progress Notes (Signed)
   02/04/23 2257  Assess: MEWS Score  Temp 98.7 F (37.1 C)  BP (!) 80/61  MAP (mmHg) 69  Pulse Rate (!) 55  Resp 12  Level of Consciousness Responds to Voice  SpO2 96 %  O2 Device Room Air  Patient Activity (if Appropriate) In bed  Assess: MEWS Score  MEWS Temp 0  MEWS Systolic 2  MEWS Pulse 0  MEWS RR 1  MEWS LOC 1  MEWS Score 4  MEWS Score Color Red  Assess: if the MEWS score is Yellow or Red  Were vital signs accurate and taken at a resting state? Yes  Does the patient meet 2 or more of the SIRS criteria? Yes  Does the patient have a confirmed or suspected source of infection? Yes  MEWS guidelines implemented  Yes, red  Treat  MEWS Interventions Considered administering scheduled or prn medications/treatments as ordered  Take Vital Signs  Increase Vital Sign Frequency  Red: Q1hr x2, continue Q4hrs until patient remains green for 12hrs  Escalate  MEWS: Escalate Red: Discuss with charge nurse and notify provider. Consider notifying RRT. If remains red for 2 hours consider need for higher level of care  Notify: Charge Nurse/RN  Name of Charge Nurse/RN Notified Bernie Covey RN  Provider Notification  Provider Name/Title Dr. Arlean Hopping  Date Provider Notified 02/04/23  Method of Notification Page  Notification Reason Change in status  Provider response See new orders  Date of Provider Response 02/04/23  Notify: Rapid Response  Name of Rapid Response RN Notified Marcellina Millin RN  Date Rapid Response Notified 02/04/23  Assess: SIRS CRITERIA  SIRS Temperature  0  SIRS Pulse 0  SIRS Respirations  0  SIRS WBC 0  SIRS Score Sum  0   Patient had received multiple doses of IV Morphine for pain during day shift.  Per report, patient was A & O x4 and interactive during day shift.  Due to contractures and Sacral Wound, patient experiences severe pain during any movement.  Patient was sleeping at shift change.  At 2029, B/P was 94/55.  At 2257, b/p was 80/61 (69 MAP), HR-55,  RR-12, 96% RA.  Patient was responsive to voice only.  She would open eyes and then immediately go back to sleep.  Unable to give any of patient's HS medications d/t mentation.  RR RN notified and came to [facility].  Pt able to squeeze hand with left hand.  Dr. Arlean Hopping made aware.  1 LTR bolus given over 2 hours.  @ 0058, B/P 100/63 (74 MAP), HR - 51, RR - 7, 97% RA.  She is still only minimally responsive to voice.  Dr. Frederick Peers made aware of update.  Order received to give Narcan 0.4 x 1.  This was given at 0134.  Patient immediately more awake.  Able to answer questions appropriately.  At 0142, B/P - 119/74 (87 MAP), HR - 70, RR - 15, 98% RA.  Will continue to monitor patient.  Bernie Covey RN

## 2023-02-05 NOTE — Progress Notes (Signed)
   Palliative Medicine Inpatient Follow Up Note HPI: [name] [name] is a 62 year old female with past medical history significant for CVA, bipolar disorder, PE, cellulitis of right groin and osteomyelitis/chronic would to her sacrum, and sever contractures. She was admitted through the ED where she was transported after bleeding of right groin was noted during routine care. Patient states she thinks her brief had been on too tight. Her wound is hard to visualize. She is being treated with antibiotics with a wound care consult.   Palliative care has been requested to assist with goals of care conversations.   Today's Discussion 02/05/2023  *Please note that this is a verbal dictation therefore any spelling or grammatical errors are due to the "Dragon Medical One" system interpretation.  Chart reviewed inclusive of vital signs, progress notes, laboratory results, and diagnostic images.   I met with [name] [name] at bedside this morning, she is awake and alert. She shares that her pain remains to be significant. We reviewed the concerns overnight that her pain regiment created increased sedation. Created space and opportunity for patient to explore thoughts feelings and fears regarding current medical situation.Discussed and reaffirmed goals which are to continue antibiotics at this time - treating what is treatable.    Have called patients brother, [name] [name] and left a HIPAA compliant VM.  Questions and concerns addressed/Palliative Support Provided.   Objective Assessment: Vital Signs Vitals:   02/05/23 0142 02/05/23 0632  BP: 119/74 (!) 146/77  Pulse: 70 62  Resp: 15 15  Temp: 97.7 F (36.5 C) 98.1 F (36.7 C)  SpO2: 98% 95%    Intake/Output Summary (Last 24 hours) at 02/05/2023 3086 Last data filed at 02/05/2023 0200 Gross per 24 hour  Intake 3017.43 ml  Output 0 ml  Net 3017.43 ml   Last Weight  Most recent update: 02/02/2023  9:18 AM    Weight  54.4 kg (120 lb)            Gen:  Frail  Elderly F in moder HEENT: moist mucous membranes CV: Regular rate and rhythm  PULM:  On RA, breathing is even and nonlabored ABD: soft/nontender  EXT: Contractures in all four extremities Neuro: Alert and oriented x3   SUMMARY OF RECOMMENDATIONS   DNAR/DNI  Appreciate TOC reaching out to patients brother regarding placement options - Lives at [facility] though care quality has declined  Ongoing PMT support  Symptom Management:  Acute on Chronic Pain:  Decrease morphine 2mg  every 3 hours as needed for sever pain Tordol 15mg  IVP Q6H (for five days) - can switch to Celebrex on discharge Oxycodone IR 5 mg every 4 hours as needed for moderate pain Acetaminophen 1000 mg every 8 hours as needed neurontin 600 mg every 8 hours Xanaflex 4 mg three times a day for muscle spasms.  Insomnia: Melatonin 3 mg at bedtime  Nausea: Zofran 4mg  Q8H PRN  Billing based on MDM: High - Parenteral Opoid review with primary ______________________________________________________________________________________ Lamarr Lulas [place] Palliative Medicine Team Team Cell Phone: [phone] Please utilize secure chat with additional questions, if there is no response within 30 minutes please call the above phone number  Palliative Medicine Team providers are available by phone from 7am to 7pm daily and can be reached through the team cell phone.  Should this patient require assistance outside of these hours, please call the patient's attending physician.

## 2023-02-06 DIAGNOSIS — L98429 Non-pressure chronic ulcer of back with unspecified severity: Secondary | ICD-10-CM

## 2023-02-06 DIAGNOSIS — Z7189 Other specified counseling: Secondary | ICD-10-CM | POA: Diagnosis not present

## 2023-02-06 DIAGNOSIS — Z515 Encounter for palliative care: Secondary | ICD-10-CM | POA: Diagnosis not present

## 2023-02-06 MED ORDER — OXYCODONE HCL 5 MG PO TABS: 2.5000 mg | ORAL_TABLET | Freq: Four times a day (QID) | ORAL | 0 refills | Status: AC | PRN

## 2023-02-06 MED ORDER — ACETAMINOPHEN 500 MG PO TABS: 1000.0000 mg | ORAL_TABLET | Freq: Three times a day (TID) | ORAL | Status: AC | PRN

## 2023-02-06 MED ORDER — MAGNESIUM SULFATE 2 GM/50ML IV SOLN
2.0000 g | Freq: Once | INTRAVENOUS | Status: AC
  Administered 2023-02-06: 2 g via INTRAVENOUS
  Filled 2023-02-06: qty 50

## 2023-02-06 MED ORDER — NALOXONE HCL 4 MG/0.1ML NA LIQD: 1.0000 | Freq: Once | NASAL | 0 refills | Status: AC

## 2023-02-06 MED ORDER — PANTOPRAZOLE SODIUM 40 MG PO TBEC: 40.0000 mg | DELAYED_RELEASE_TABLET | Freq: Every day | ORAL | Status: AC

## 2023-02-06 MED ORDER — CELECOXIB 100 MG PO CAPS: 100.0000 mg | ORAL_CAPSULE | Freq: Every day | ORAL | Status: AC

## 2023-02-06 MED ORDER — AMOXICILLIN-POT CLAVULANATE 875-125 MG PO TABS: 1.0000 | ORAL_TABLET | Freq: Two times a day (BID) | ORAL | 0 refills | Status: AC

## 2023-02-06 MED ORDER — OXYCODONE HCL 5 MG PO TABS: 5.0000 mg | ORAL_TABLET | Freq: Four times a day (QID) | ORAL | 0 refills | Status: DC | PRN

## 2023-02-06 NOTE — TOC Transition Note (Signed)
Transition of Care [facility]) - CM/SW Discharge Note   Patient Details  Name: [name] [name] MRN: [id number] Date of Birth: September 11, 1960  [facility]) CM/SW Contact:  Ralene Bathe, LCSW Phone Number: 02/06/2023, 3:30 PM   Clinical Narrative:    Patient will DC to:  [name] Anticipated DC date: 02/06/2023 Family notified: Yes Transport by: Sharin Mons   Per MD patient ready for DC to SNF. RN to call report prior to discharge [phone] room 119A). RN, patient, patient's family, and facility notified of DC. Discharge Summary  sent to facility. DC packet on chart. Ambulance transport requested for patient.   CSW will sign off for now as social work intervention is no longer needed. Please consult [place] again if new needs arise.    Final next level of care: Skilled Nursing Facility Barriers to Discharge: Barriers Resolved   Patient Goals and CMS Choice      Discharge Placement                Patient [facility] bed at:  [facility]) Patient to be transferred to facility by: [facility] Name of family member notified: Arriya, Ravi (SIL) (Other)  [phone] Patient and family notified of of transfer: 02/06/23  Discharge Plan and Services Additional resources added to the After Visit Summary for                                       Social Determinants of Health (SDOH) Interventions SDOH Screenings   Food Insecurity: No Food Insecurity (02/04/2023)  Housing: Low Risk  (02/04/2023)  Transportation Needs: No Transportation Needs (02/04/2023)  Utilities: Not At Risk (02/04/2023)  Depression (PHQ2-9): Low Risk  (12/20/2021)  Tobacco Use: High Risk (02/02/2023)     Readmission Risk Interventions     No data to display

## 2023-02-06 NOTE — Progress Notes (Signed)
Pt. refusing blood sugar, asymptomatic; patient education provided. Will continue to monitor.

## 2023-02-06 NOTE — Progress Notes (Signed)
   02/06/23 1100  Spiritual Encounters  Type of Visit Attempt (pt unavailable)  Care provided to: Pt not available   The chaplain attempted to visit the patient, but she was unavailable.  M.Kubra , MA Chaplain Intern [phone]

## 2023-02-06 NOTE — Progress Notes (Signed)
   02/06/23 1100  Spiritual Encounters  Type of Visit Attempt (pt unavailable)  Care provided to: Pt not available   The chaplain attempted to visit, but the patient was unavailable.  M.Kubra , MA Chaplain Intern [phone]

## 2023-02-06 NOTE — Discharge Summary (Signed)
**Note De-Identified vi Obfusction** Physicin Dischrge Summry  [name] [name] ZOX:[id number] DOB: [birth year] DOA: 02/02/2023  PCP: Pcp, No  Admit dte: 02/02/2023 Dischrge dte: 02/06/2023  Time spent: 40 minutes  Recommendtions for Outptient Follow-up:  Follow outptient CBC/CMP  Follow pin control outptient Continue wound cre for scrl decubitus nd inguinl regions Would continue to engge with pllitive cre outptient for ongoing gols of cre discussions   Dischrge Dignoses:  Active Problems:   Acute osteomyelitis of scrum (HCC)   Intrctble pin   Scrl ulcer (HCC)   Bleeding   Dischrge Condition: stble  Diet recommendtion: hert helthy  Filed Weights   02/02/23 0918  Weight: 54.4 kg    History of present illness:   [name] [name] is  62 y.o. 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   She's been treted for skin nd soft tissue infection nd hs grdully improved.    Dischrging bck to linden plce tody.  Hospitl Course:  Assessment nd Pln:  Gols of cre Apprecite pllitive ssistnce   Suspected Right Inguinl Wound with Possible Infection Leukocytosis resolved She ws dmitted with noted bleeding Difficult exm given her significnt contrcture to the RLE which folds cross her pelvis - not ble to clerly see wht is bleeding or if there is  wound Of note, seems to be chronic issue.  Cellulitis of R groin noted on dischrge from Dec 2023 nd 10/2022.  December 2023 she ws treted for infection of the right groin tht on photos seems to hve some bleeding s well  Cse ws discussed with generl surgery regrding possibility of exm under nesthesi - Dr. Derrell Lolling nd Dr. Doylene Cnrd recommended wound cre c/s nd reconsult surgery if needed.   Discussed with rdiology over phone, they noted  limittions due to hrdwre in re, but possible skin thickening noted Will dischrge with ntibiotics Per wound cre tem: Medil thighs nd inguinl res with weeping re to be clensed dily nd our house ntimicrobil moisture wicking textile, InterDry used to wick wy moisture.   Chronic Pin  Ws oversedted 8/11-8/12, hypotensive nd with slow RR requiring nrcn Doing better on reduced morphine dose/oxy Will dischrge home on oxy 2.5-5 mg q6 hrs prn Continue gbpentin, znflex, prn tylenol Will dd celebrex    Scrl Decubitus Ulcer Scrl Osteomyelitis Wound cre c/s Completed 6 weeks bx on 12/21/2021   Ptient is contrcted nd is being turned from side to side. Bilterl Prevlon boots re provided. Topicl cre to scrl wound is to be with dily clensing nd filling of defect with silver hydrofiber strip (Aqucel Ag+ [employer] # [id number]). Nursing is instructed to leve  "til" pproximtely 2 inches in length protruding from wound to id in retrievl nd to ct s  wick. Hydrofiber is to be topped with silicone fom  Hypotension Continue midodrine   Depression SSRI   History of Stroke Aspirin pprently not on her MAR? Will need to review    Chronic Pin Gbpentin Znflex        Procedures: none   Consulttions: Pllitive cre  Dischrge Exm: Vitls:   02/06/23 0552 02/06/23 0834  BP: 117/70 125/76  Pulse: (!) 52 (!) 53  Resp:  16  Temp: 97.6 F (36.4 C) (!) 97.5 F (36.4 C)  SpO2: 100% 98%   No new complints Discussed dischrge pln  Discussed with dughter in lw **Note De-Identified vi Obfusction** Generl: No cute distress. Crdiovsculr: RRR Lungs: unlbored Neurologicl: Alert,  little sleepy this morning.  Skin: scrl wound not exmined Extremities: contrctures   Dischrge Instructions   Dischrge Instructions     Cll MD for:  difficulty brething, hedche or visul disturbnces   Complete by: As directed    Cll MD for:  extreme  ftigue   Complete by: As directed    Cll MD for:  hives   Complete by: As directed    Cll MD for:  persistnt dizziness or light-hededness   Complete by: As directed    Cll MD for:  persistnt nuse nd vomiting   Complete by: As directed    Cll MD for:  redness, tenderness, or signs of infection (pin, swelling, redness, odor or green/yellow dischrge round incision site)   Complete by: As directed    Cll MD for:  severe uncontrolled pin   Complete by: As directed    Cll MD for:  temperture >100.4   Complete by: As directed    Diet - low sodium hert helthy   Complete by: As directed    Dischrge instructions   Complete by: As directed    You were seen for bleeding to your right inguinl region.   I suspect you hd n infection to this region, though our bility to fully exmine this region ws limited with your contrctures.  We'll send you home with  few more dys of ntibiotics.  Continue the wound cre s recommended by our wound cre nurses.  Your chronic scrl wound needs continued diligent wound cre nd follow up.  You continue to hve evidence of  chronic infection. Follow with your outptient providers regrding the overll pln.  Return for new, recurrent, or worsening symptoms.  Plese sk your PCP to request records from this hospitliztion so they know wht ws done nd wht the next steps will be.   Dischrge wound cre:   Complete by: As directed    Wound cre to scrl wound: Clense with norml sline, pt dry. Fill defect with "ribbon" of silver hydrofiber (Aqucel Ag+ Advntge, [facility] [place] # [id number]). Leve pproximtely 2-inches of Aqucel Ag+ protruding from wound for ese in retrievl, Top with silicone fom. Chnge dily.  Medil thighs nd inguinl res with weeping re to be clensed dily nd our house ntimicrobil moisture wicking textile, InterDry used to wick wy moisture.   Increse ctivity slowly   Complete by: As directed        Allergies s of 02/06/2023       Rections   Ativn [lorzepm] Other (See Comments)   Unknown rection Listed on MAR   Bctrim [sulfmethoxzole-trimethoprim] Other (See Comments)   Unknown rection  Listed on MAR   Pxil [proxetine Hcl] Other (See Comments)   Unknown rection Listed on MAR        Mediction List     STOP tking these medictions    ibuprofen 400 MG tblet Commonly known s: ADVIL   trMADol 50 MG tblet Commonly known s: ULTRAM       TAKE these medictions    cetminophen 500 MG tblet Commonly known s: TYLENOL Tke 2 tblets (1,000 mg totl) by mouth every 8 (eight) hours s needed.   moxicillin-clvulnte 875-125 MG tblet Commonly known s: AUGMENTIN Tke 1 tblet by mouth 2 (two) times dily for 3 dys.   scorbic cid 500 MG tblet Commonly known s: VITAMIN C Tke 500 mg by mouth in the morning.   spirin 81  MG chewable tablet Chew 81 mg by mouth every evening.   Calcium 600-D 600-10 MG-MCG Tabs Generic drug: Calcium Carb-Cholecalciferol Take 1 tablet by mouth in the morning.   celecoxib 100 MG capsule Commonly known as: CeleBREX Take 1 capsule (100 mg total) by mouth daily.   cholecalciferol 25 MCG (1000 UNIT) tablet Commonly known as: VITAMIN D3 Take 1,000 Units by mouth daily.   fluconazole 150 MG tablet Commonly known as: DIFLUCAN Take 150 mg by mouth daily. For 14 days.   FLUoxetine 40 MG capsule Commonly known as: PROZAC Take 40 mg by mouth in the morning.   gabapentin 600 MG tablet Commonly known as: NEURONTIN Take 600 mg by mouth every 8 (eight) hours.   LACTOBACILLUS PO Take 1 capsule by mouth 2 (two) times daily.   loperamide 2 MG capsule Commonly known as: IMODIUM Take 2 mg by mouth every 6 (six) hours as needed for diarrhea or loose stools.   magnesium oxide 400 (240 Mg) MG tablet Commonly known as: MAG-OX Take 400 mg by mouth daily.   melatonin 3 MG Tabs tablet Take 3 mg by mouth at  bedtime.   midodrine 10 MG tablet Commonly known as: PROAMATINE Take 1 tablet (10 mg total) by mouth 3 (three) times daily with meals.   mirtazapine 7.5 MG tablet Commonly known as: REMERON Take 7.5 mg by mouth at bedtime. (2100)   naloxone 4 MG/0.1ML Liqd nasal spray kit Commonly known as: NARCAN Place 1 spray into the nose once for 1 dose.   NUTRITIONAL DRINK PO Take 240 mLs by mouth 3 (three) times daily.   feeding supplement Liqd Take 30 mLs by mouth in the morning and at bedtime.   oxyCODONE 5 MG immediate release tablet Commonly known as: Oxy IR/ROXICODONE Take 0.5-1 tablets (2.5-5 mg total) by mouth every 6 (six) hours as needed for up to 3 days for moderate pain.   OXYGEN Inhale 2 L into the lungs as needed (oxygen level < 90% on room air).   pantoprazole 40 MG tablet Commonly known as: Protonix Take 1 tablet (40 mg total) by mouth daily.   Pro-Stat Liqd Take 30 mLs by mouth 3 (three) times daily.   senna 8.6 MG tablet Commonly known as: SENOKOT Take 2 tablets by mouth 2 (two) times daily.   Systane Complete 0.6 % Soln Generic drug: Propylene Glycol Place 1 drop into both eyes 2 (two) times daily.   tiZANidine 4 MG tablet Commonly known as: ZANAFLEX Take 4 mg by mouth 3 (three) times daily.               Discharge Care Instructions  (From admission, onward)           Start     Ordered   02/06/23 0000  Discharge wound care:       Comments: Wound care to sacral wound: Cleanse with normal saline, pat dry. Fill defect with "ribbon" of silver hydrofiber (Aquacel Ag+ Advantage, [employer] [place] # [id number]). Leave approximately 2-inches of Aquacel Ag+ protruding from wound for ease in retrieval, Top with silicone foam. Change daily.  Medial thighs and inguinal areas with weeping are to be cleansed daily and our house antimicrobial moisture wicking textile, InterDry used to wick away moisture.   02/06/23 1420           Allergies  Allergen Reactions    Ativan [Lorazepam] Other (See Comments)    Unknown reaction Listed on MAR   Bactrim [Sulfamethoxazole-Trimethoprim] Other (See Comments)  Unknown reaction  Listed on MAR   Paxil [Paroxetine Hcl] Other (See Comments)    Unknown reaction Listed on MAR    Contact information for after-discharge care     Destination     [facility] .   Service: Skilled Nursing Contact information: [address] [place] [address] [phone]                      The results of significant diagnostics from this hospitalization (including imaging, microbiology, ancillary and laboratory) are listed below for reference.    Significant Diagnostic Studies: CT PELVIS W CONTRAST  Result Date: 02/02/2023 CLINICAL DATA:  Soft tissue infection suspected. Evaluate for necrotizing fasciitis. EXAM: CT PELVIS WITH CONTRAST TECHNIQUE: Multidetector CT imaging of the pelvis was performed using the standard protocol following the bolus administration of intravenous contrast. RADIATION DOSE REDUCTION: This exam was performed according to the departmental dose-optimization program which includes automated exposure control, adjustment of the mA and/or kV according to patient size and/or use of iterative reconstruction technique. CONTRAST:  75mL OMNIPAQUE IOHEXOL 350 MG/ML SOLN COMPARISON:  CT abdomen and pelvis 11/01/2022 FINDINGS: Urinary Tract:  No abnormality visualized. Bowel:  Unremarkable visualized pelvic bowel loops. Vascular/Lymphatic: Atherosclerotic calcifications are present. No acute abnormality. No enlarged lymph nodes. Reproductive:  No mass or other significant abnormality Other:  There is no ascites or focal abdominal wall hernia. Musculoskeletal: There is decubitus ulcer overlying the coccyx with some underlying cortical erosions of the sacrococcygeal region. There is no evidence for soft tissue abscess, foreign body or soft tissue emphysema. Bilateral hip arthroplasties  are present. No acute fracture or hardware loosening identified. IMPRESSION: 1. Decubitus ulcer overlying the coccyx with underlying cortical erosions of the sacrococcygeal region concerning for osteomyelitis. 2. No evidence for soft tissue abscess, foreign body or soft tissue emphysema. Electronically Signed   By: Darliss Cheney M.D.   On: 02/02/2023 17:34    Microbiology: Recent Results (from the past 240 hour(s))  Blood Culture (routine x 2)     Status: None (Preliminary result)   Collection Time: 02/02/23 11:33 AM   Specimen: BLOOD LEFT HAND  Result Value Ref Range Status   Specimen Description BLOOD LEFT HAND  Final   Special Requests   Final    BOTTLES DRAWN AEROBIC AND ANAEROBIC Blood Culture adequate volume   Culture   Final    NO GROWTH 4 DAYS Performed at [facility], [address] [place], [place] [address]    Report Status PENDING  Incomplete  Blood Culture (routine x 2)     Status: None (Preliminary result)   Collection Time: 02/02/23 11:38 AM   Specimen: BLOOD LEFT HAND  Result Value Ref Range Status   Specimen Description BLOOD LEFT HAND  Final   Special Requests   Final    BOTTLES DRAWN AEROBIC AND ANAEROBIC Blood Culture adequate volume   Culture   Final    NO GROWTH 4 DAYS Performed at [facility], [address] [place], [place] [address]    Report Status PENDING  Incomplete     Labs: Basic Metabolic Panel: Recent Labs  Lab 02/02/23 1134 02/03/23 0117 02/04/23 0344 02/05/23 0036 02/06/23 0221  NA 136 139 137 138 134*  K 3.6 3.2* 3.2* 4.7 4.0  CL 98 99 102 109 100  CO2 26 25 23 23 22   GLUCOSE 94 108* 104* 112* 98  BUN 23* 14 13 16 9   CREATININE 0.36* 0.46  0.39* 0.36* 0.39*  CALCIUM 9.2 8.7* 8.4* 7.7* 8.4*  MG  --   --  1.5* 2.3 1.6*  PHOS  --   --  2.6 2.7 2.8   Liver Function Tests: Recent Labs  Lab 02/02/23 1134 02/04/23 0344 02/06/23 0221  AST 22 16 21   ALT 27 22 17   ALKPHOS 91 78 74  BILITOT 0.5 0.6 0.8  PROT 7.1 5.9*  5.2*  ALBUMIN 3.4* 2.8* 2.3*   No results for input(s): "LIPASE", "AMYLASE" in the last 168 hours. No results for input(s): "AMMONIA" in the last 168 hours. CBC: Recent Labs  Lab 02/02/23 1134 02/02/23 2109 02/03/23 0117 02/04/23 0344 02/05/23 0036 02/06/23 0221  WBC 9.7 9.8 6.9 14.7* 7.5 7.3  NEUTROABS 6.3  --   --  11.2*  --  4.1  HGB 13.4 12.7 11.8* 12.1 10.4* 12.2  HCT 41.9 40.0 37.3 38.3 33.8* 37.9  MCV 87.5 88.3 89.0 90.3 88.0 87.9  PLT 279 295 253 257 221 179   Cardiac Enzymes: No results for input(s): "CKTOTAL", "CKMB", "CKMBINDEX", "TROPONINI" in the last 168 hours. BNP: BNP (last 3 results) No results for input(s): "BNP" in the last 8760 hours.  ProBNP (last 3 results) No results for input(s): "PROBNP" in the last 8760 hours.  CBG: Recent Labs  Lab 02/04/23 2347  GLUCAP 114*       Signed:  Lacretia Nicks MD.  [facility] 02/06/2023, 3:00 PM

## 2023-02-06 NOTE — Progress Notes (Signed)
Palliative Medicine Inpatient Follow Up Note HPI: [name] is a 62 year old female with past medical history significant for CVA, bipolar disorder, PE, cellulitis of right groin and osteomyelitis/chronic would to her sacrum, and sever contractures. She was admitted through the ED where she was transported after bleeding of right groin was noted during routine care. Patient states she thinks her brief had been on too tight. Her wound is hard to visualize. She is being treated with antibiotics with a wound care consult.   Palliative care has been requested to assist with goals of care conversations.   Today's Discussion 02/06/2023  *Please note that this is a verbal dictation therefore any spelling or grammatical errors are due to the "Dragon Medical One" system interpretation.  Chart reviewed inclusive of vital signs, progress notes, laboratory results, and diagnostic images.  Has continued to need morphine IVP.  I spoke to patients RN, Allyson who shares [name] [name] remains to have a good deal of pain. The morphine does seem to provide her some relief.   I met with [name] [name] this afternoon. She does state ongoing pain though is in agreement with me looking at her groin area and sacral wound. Per groin area it does appear there is continence related dermatitis and inguinal redness/sloughing of epidermis. I spoke to patients RN about using interdry to keep her crevices dry.   [name] [name] was noted to be quite wet both her RN and nursing tech were able to assist in changing her during which time a photo of her sacral wound was taken. Appears to probe to bone.   Plan to discharge per primary.   Questions and concerns addressed/Palliative Support Provided.   Objective Assessment: Vital Signs Vitals:   02/06/23 0552 02/06/23 0834  BP: 117/70 125/76  Pulse: (!) 52 (!) 53  Resp:  16  Temp: 97.6 F (36.4 C) (!) 97.5 F (36.4 C)  SpO2: 100% 98%    Intake/Output Summary (Last 24 hours) at 02/06/2023  1149 Last data filed at 02/06/2023 0900 Gross per 24 hour  Intake 1015.52 ml  Output 0 ml  Net 1015.52 ml   Last Weight  Most recent update: 02/02/2023  9:18 AM    Weight  54.4 kg (120 lb)            Gen:  Frail Elderly F in moder HEENT: moist mucous membranes CV: Regular rate and rhythm  PULM:  On RA, breathing is even and nonlabored ABD: soft/nontender  EXT: Contractures in all four extremities Neuro: Alert and oriented x3   SUMMARY OF RECOMMENDATIONS   DNAR/DNI  Appreciate TOC reaching out to patients brother regarding placement options - Lives at [facility] though care quality has declined  Ongoing PMT support  Symptom Management:  Acute on Chronic Pain:  Decrease morphine 2mg  every 3 hours as needed for sever pain Tordol 15mg  IVP Q6H (for five days) - can switch to Celebrex on discharge Oxycodone IR 5 mg every 4 hours as needed for moderate pain Would likely benefit from initiation of OxyContin 10mg  PO BID to better help peaks and valleys of pain. Not being done as an IP due to encroaching discharge (today) and inability to FU. SNF will be made aware per attening Acetaminophen 1000 mg every 8 hours as needed neurontin 600 mg every 8 hours Xanaflex 4 mg three times a day for muscle spasms.  Insomnia: Melatonin 3 mg at bedtime  Nausea: Zofran 4mg  Q8H PRN  Billing based on MDM: High - Parenteral Opoid  review with primary ______________________________________________________________________________________ Lamarr Lulas [place] Palliative Medicine Team Team Cell Phone: [phone] Please utilize secure chat with additional questions, if there is no response within 30 minutes please call the above phone number  Palliative Medicine Team providers are available by phone from 7am to 7pm daily and can be reached through the team cell phone.  Should this patient require assistance outside of these hours, please call the patient's attending  physician.

## 2023-02-06 NOTE — TOC Progression Note (Signed)
Transition of Care [facility]) - Initial/Assessment Note    Patient Details  Name: [name] [name] MRN: [id number] Date of Birth: Mar 15, 1961  [facility] CM/SW Contact:    Ralene Bathe, LCSW Phone Number: 02/06/2023, 1:54 PM  Clinical Narrative:                 LCSW contacted the patient's sister in law to inquire as to whether the family has located another facility for the patient.  The sister in law reports that the patient's FL2 was sent to [facility] and the family wanted to know if [facility] or [facility] has beds available.  LCSW contacted [facility] and [facility].  Neither have LT beds available.  LCSW contacted the patient's sister in law and informed of the above.  The sister in law reports that she understands that the patient will discharge back to [facility] as they await a bed at another LTC facility.  LCSW provided the sister in law with the number to the admissions coordinator at [facility] so that the patient could be placed on a waiting list.    TOC following.   Expected Discharge Plan: Skilled Nursing Facility Barriers to Discharge: Continued Medical Work up   Patient Goals and CMS Choice            Expected Discharge Plan and Services       Living arrangements for the past 2 months: Skilled Nursing Facility                                      Prior Living Arrangements/Services Living arrangements for the past 2 months: Skilled Nursing Facility Lives with:: Facility Resident                   Activities of Daily Living Home Assistive Devices/Equipment: None ADL Screening (condition at time of admission) Patient's cognitive ability adequate to safely complete daily activities?: Yes Is the patient deaf or have difficulty hearing?: No Does the patient have difficulty seeing, even when wearing glasses/contacts?: No Does the patient have difficulty concentrating, remembering, or making decisions?: No Patient able to express  need for assistance with ADLs?: Yes Does the patient have difficulty dressing or bathing?: Yes Independently performs ADLs?: No Communication: Independent Dressing (OT): Dependent Is this a change from baseline?: Pre-admission baseline Feeding: Independent Bathing: Dependent Is this a change from baseline?: Pre-admission baseline Toileting: Dependent Is this a change from baseline?: Pre-admission baseline In/Out Bed: Dependent Is this a change from baseline?: Pre-admission baseline Walks in Home: Dependent Is this a change from baseline?: Pre-admission baseline Does the patient have difficulty walking or climbing stairs?: Yes Weakness of Legs: Both Weakness of Arms/Hands: Right  Permission Sought/Granted                  Emotional Assessment       Orientation: : Oriented to Self, Oriented to Place, Oriented to  Time, Oriented to Situation Alcohol / Substance Use: Not Applicable Psych Involvement: No (comment)  Admission diagnosis:  Osteomyelitis (HCC) [M86.9] Candida infection [B37.9] Intractable pain [R52] Acute osteomyelitis of sacrum (HCC) [M46.28] Patient Active Problem List   Diagnosis Date Noted   Osteomyelitis (HCC) 02/02/2023   Sacral ulcer (HCC) 02/02/2023   Bleeding 02/02/2023   Cellulitis of right groin 11/01/2022   Cellulitis and wound of the groin regeion 06/03/2022   Intractable pain 06/03/2022   History  of depression 06/03/2022   Acute metabolic encephalopathy 06/03/2022   Malnutrition of moderate degree 11/11/2021   Anemia 11/09/2021   Decubitus ulcer of sacral region 11/09/2021   Acute osteomyelitis of sacrum (HCC) 11/08/2021   Altered mental status, unspecified 11/21/2017   Tobacco abuse 11/21/2017   Alcohol abuse 11/21/2017   History of CVA (cerebrovascular accident) 11/21/2017   Acute lower UTI 11/21/2017   Female stress incontinence 10/10/2012   Urinary frequency 10/10/2012   Urinary tract infection, site not specified 09/26/2012    PCP:  Pcp, No Pharmacy:   [facility] - [place], [place] - [address] [place] [address] Phone: [phone] Fax: [phone]     Social Determinants of Health (SDOH) Social History: SDOH Screenings   Food Insecurity: No Food Insecurity (02/04/2023)  Housing: Low Risk  (02/04/2023)  Transportation Needs: No Transportation Needs (02/04/2023)  Utilities: Not At Risk (02/04/2023)  Depression (PHQ2-9): Low Risk  (12/20/2021)  Tobacco Use: High Risk (02/02/2023)   SDOH Interventions:     Readmission Risk Interventions     No data to display

## 2023-02-13 ENCOUNTER — Encounter (HOSPITAL_BASED_OUTPATIENT_CLINIC_OR_DEPARTMENT_OTHER): Payer: Medicaid Other | Admitting: General Surgery

## 2023-03-13 ENCOUNTER — Encounter (HOSPITAL_BASED_OUTPATIENT_CLINIC_OR_DEPARTMENT_OTHER): Payer: Medicaid Other | Attending: General Surgery | Admitting: General Surgery

## 2023-03-13 DIAGNOSIS — L89222 Pressure ulcer of left hip, stage 2: Secondary | ICD-10-CM | POA: Insufficient documentation

## 2023-03-13 DIAGNOSIS — Z8673 Personal history of transient ischemic attack (TIA), and cerebral infarction without residual deficits: Secondary | ICD-10-CM | POA: Insufficient documentation

## 2023-03-13 DIAGNOSIS — L89154 Pressure ulcer of sacral region, stage 4: Secondary | ICD-10-CM | POA: Insufficient documentation

## 2023-03-13 DIAGNOSIS — Z86711 Personal history of pulmonary embolism: Secondary | ICD-10-CM | POA: Diagnosis not present

## 2023-03-13 DIAGNOSIS — G8929 Other chronic pain: Secondary | ICD-10-CM | POA: Insufficient documentation

## 2023-03-13 DIAGNOSIS — F17210 Nicotine dependence, cigarettes, uncomplicated: Secondary | ICD-10-CM | POA: Diagnosis not present

## 2023-03-13 NOTE — Progress Notes (Signed)
[name], [name] ([id number]) [phone].pdf Page 1 of 9 Visit Report for 03/13/2023 Chief Complaint Document Details Patient Name: Date of Service: [facility], [name] 03/13/2023 8:00 A M Medical Record Number: [id number] Patient Account Number: [id number] Date of Birth/Sex: Treating RN: [birth year] (61 y.o. F) Primary Care Provider: PCP, NO Other Clinician: Referring Provider: Treating Provider/Extender: Cleophus Molt in Treatment: 0 Information Obtained from: Patient Chief Complaint Patient is at the clinic for treatment of multiple open pressure ulcers Electronic Signature(s) Signed: 03/13/2023 10:05:59 AM By: Duanne Guess MD FACS Entered By: Duanne Guess on 03/13/2023 07:05:59 -------------------------------------------------------------------------------- HPI Details Patient Name: Date of Service: [name] [name], [name] 03/13/2023 8:00 A M Medical Record Number: [id number] Patient Account Number: [id number] Date of Birth/Sex: Treating RN: [birth year] (61 y.o. 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 I am unclear as to how long her sacral pressure ulcer has been present, but there is bone exposed and apparently she was having more drainage from the site. She has also developed a stage III pressure ulcer on her left lateral ankle and a stage II pressure ulcer on her left medial ankle. She is not diabetic, but does have a smoking history and continues to smoke about half a pack a day. ABIs were performed and  were within normal limits. Antibiotics were prescribed for a total course of 6 weeks. She has been followed by infectious disease and last saw them on December 20, 2021. Infectious disease referred her to the wound care center for evaluation and management. The patient resists frequent turning because of chronic pain issues. The aide from her facility states that she is on a low-air-loss mattress. The patient says she has not been permitting dressing changes for the past few days because of her pain. The dressings that were removed from her sacral wound were putrid. She does state that she drinks 2-3 protein shakes such as boost or Ensure daily and she is also receiving Pro-Stat at her facility. 01/16/2022: She has not received a level 3 surface and is still on a level 2. She says she is drinking protein shakes; she has a Boost with her today but it only has 10 g of protein. The left medial ankle wound has a little bit of eschar and just a tiny open area. The left lateral ankle wound has hypertrophic granulation tissue. The sacral wound is perhaps a little bit smaller but still has bone frankly exposed. 02/06/2022: We are still working on getting her a level 3 mattress surface. Her pain has decreased and she is tolerating her dressing changes much better. The left medial ankle wound is healed. The left latera ankle wound has a lot of accumulated slough. The sacrum is a little smaller and the tunneling is not as deep. Bone is still exposed. Minimal slough accumulation. READMISSION 03/13/2023 The patient returns after over a years absence. She says that her nursing facility got a wound care nurse and that is what she did not return. She has been hospitalized multiple times since I saw her last for issues related to her wounds. Most recently, she was in the  [name], [name] ([id number]) [phone].pdf Page 1 of 9 Visit Report for 03/13/2023 Chief Complaint Document Details Patient Name: Date of Service: [facility], [name] 03/13/2023 8:00 A M Medical Record Number: [id number] Patient Account Number: [id number] Date of Birth/Sex: Treating RN: [birth year] (61 y.o. F) Primary Care Provider: PCP, NO Other Clinician: Referring Provider: Treating Provider/Extender: Cleophus Molt in Treatment: 0 Information Obtained from: Patient Chief Complaint Patient is at the clinic for treatment of multiple open pressure ulcers Electronic Signature(s) Signed: 03/13/2023 10:05:59 AM By: Duanne Guess MD FACS Entered By: Duanne Guess on 03/13/2023 07:05:59 -------------------------------------------------------------------------------- HPI Details Patient Name: Date of Service: [name] [name], [name] 03/13/2023 8:00 A M Medical Record Number: [id number] Patient Account Number: [id number] Date of Birth/Sex: Treating RN: [birth year] (61 y.o. 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 I am unclear as to how long her sacral pressure ulcer has been present, but there is bone exposed and apparently she was having more drainage from the site. She has also developed a stage III pressure ulcer on her left lateral ankle and a stage II pressure ulcer on her left medial ankle. She is not diabetic, but does have a smoking history and continues to smoke about half a pack a day. ABIs were performed and  were within normal limits. Antibiotics were prescribed for a total course of 6 weeks. She has been followed by infectious disease and last saw them on December 20, 2021. Infectious disease referred her to the wound care center for evaluation and management. The patient resists frequent turning because of chronic pain issues. The aide from her facility states that she is on a low-air-loss mattress. The patient says she has not been permitting dressing changes for the past few days because of her pain. The dressings that were removed from her sacral wound were putrid. She does state that she drinks 2-3 protein shakes such as boost or Ensure daily and she is also receiving Pro-Stat at her facility. 01/16/2022: She has not received a level 3 surface and is still on a level 2. She says she is drinking protein shakes; she has a Boost with her today but it only has 10 g of protein. The left medial ankle wound has a little bit of eschar and just a tiny open area. The left lateral ankle wound has hypertrophic granulation tissue. The sacral wound is perhaps a little bit smaller but still has bone frankly exposed. 02/06/2022: We are still working on getting her a level 3 mattress surface. Her pain has decreased and she is tolerating her dressing changes much better. The left medial ankle wound is healed. The left latera ankle wound has a lot of accumulated slough. The sacrum is a little smaller and the tunneling is not as deep. Bone is still exposed. Minimal slough accumulation. READMISSION 03/13/2023 The patient returns after over a years absence. She says that her nursing facility got a wound care nurse and that is what she did not return. She has been hospitalized multiple times since I saw her last for issues related to her wounds. Most recently, she was in the  [name], [name] ([id number]) [phone].pdf Page 1 of 9 Visit Report for 03/13/2023 Chief Complaint Document Details Patient Name: Date of Service: [facility], [name] 03/13/2023 8:00 A M Medical Record Number: [id number] Patient Account Number: [id number] Date of Birth/Sex: Treating RN: [birth year] (61 y.o. F) Primary Care Provider: PCP, NO Other Clinician: Referring Provider: Treating Provider/Extender: Cleophus Molt in Treatment: 0 Information Obtained from: Patient Chief Complaint Patient is at the clinic for treatment of multiple open pressure ulcers Electronic Signature(s) Signed: 03/13/2023 10:05:59 AM By: Duanne Guess MD FACS Entered By: Duanne Guess on 03/13/2023 07:05:59 -------------------------------------------------------------------------------- HPI Details Patient Name: Date of Service: [name] [name], [name] 03/13/2023 8:00 A M Medical Record Number: [id number] Patient Account Number: [id number] Date of Birth/Sex: Treating RN: [birth year] (61 y.o. 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 I am unclear as to how long her sacral pressure ulcer has been present, but there is bone exposed and apparently she was having more drainage from the site. She has also developed a stage III pressure ulcer on her left lateral ankle and a stage II pressure ulcer on her left medial ankle. She is not diabetic, but does have a smoking history and continues to smoke about half a pack a day. ABIs were performed and  were within normal limits. Antibiotics were prescribed for a total course of 6 weeks. She has been followed by infectious disease and last saw them on December 20, 2021. Infectious disease referred her to the wound care center for evaluation and management. The patient resists frequent turning because of chronic pain issues. The aide from her facility states that she is on a low-air-loss mattress. The patient says she has not been permitting dressing changes for the past few days because of her pain. The dressings that were removed from her sacral wound were putrid. She does state that she drinks 2-3 protein shakes such as boost or Ensure daily and she is also receiving Pro-Stat at her facility. 01/16/2022: She has not received a level 3 surface and is still on a level 2. She says she is drinking protein shakes; she has a Boost with her today but it only has 10 g of protein. The left medial ankle wound has a little bit of eschar and just a tiny open area. The left lateral ankle wound has hypertrophic granulation tissue. The sacral wound is perhaps a little bit smaller but still has bone frankly exposed. 02/06/2022: We are still working on getting her a level 3 mattress surface. Her pain has decreased and she is tolerating her dressing changes much better. The left medial ankle wound is healed. The left latera ankle wound has a lot of accumulated slough. The sacrum is a little smaller and the tunneling is not as deep. Bone is still exposed. Minimal slough accumulation. READMISSION 03/13/2023 The patient returns after over a years absence. She says that her nursing facility got a wound care nurse and that is what she did not return. She has been hospitalized multiple times since I saw her last for issues related to her wounds. Most recently, she was in the  hospital at the beginning and middle of August due to bleeding from her right groin. She has open wounds on her inguinal crease at the top of her right  thigh, but due to her severe contractures, these have been left essentially untreated. There is a strong fungal odor coming from her groin area. She has a new ulcer on her left greater trochanter. It is no deeper than an abrasion and is clean. Her sacral ulcer is nearly healed with just a tiny residual open area. Electronic Signature(s) Gibler, Marijke L ([id number]) [phone].pdf Page 2 of 9 Signed: 03/13/2023 10:09:08 AM By: Duanne Guess MD FACS Entered By: Duanne Guess on 03/13/2023 07:09:07 -------------------------------------------------------------------------------- Physical Exam Details Patient Name: Date of Service: [facility], [name] 03/13/2023 8:00 A M Medical Record Number: [id number] Patient Account Number: [id number] Date of Birth/Sex: Treating RN: 30-May-1961 (61 y.o. F) Primary Care Provider: PCP, NO Other Clinician: Referring Provider: Treating Provider/Extender: Cleophus Molt in Treatment: 0 Constitutional . . . . Chronically ill with severe contractures, but in no acute distress.Marland Kitchen Respiratory Normal work of breathing on room air. Musculoskeletal Severe contractures of all limbs except for the left upper extremity. Notes She has open wounds on her inguinal crease at the top of her right thigh. The tissue is raw and actively bleeding. There is a strong fungal odor coming from her groin area. She has a new ulcer on her left greater trochanter. It is no deeper than an abrasion and is clean. Her sacral ulcer is nearly healed with just a tiny residual open area. Electronic Signature(s) Signed: 03/13/2023 10:10:36 AM By: Duanne Guess MD FACS Entered By: Duanne Guess on 03/13/2023 07:10:35 -------------------------------------------------------------------------------- Physician Orders Details Patient Name: Date of Service: [name] [name], [name] 03/13/2023 8:00 A M Medical Record Number: [id number] Patient Account  Number: [id number] Date of Birth/Sex: Treating RN: 1960-08-24 (61 y.o. 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 - ++++ EXTRA TIME HOYER 90 mins +++++Dr. Lady Gary room 3 Return A Anesthetic Wound #4 Right Groin (In clinic) Topical Lidocaine 4% applied to wound bed Wound #5 Left Trochanter Bold, Cathryn L ([id number]) [id number] Page 3 of 9 (In clinic) Topical Lidocaine 4% applied to wound bed Wound #6 Sacrum (In clinic) Topical Lidocaine 4% applied to wound bed Bathing/ Shower/ Hygiene May shower and wash wound with soap and water. Additional Orders / Instructions Other: - No brief. Can put brief under the patient. Recommend Interdry pads in right groin Quick clot sent home with patient- use in right groin Nystatin around right groin wound Wound Treatment Wound #4 - Groin Wound Laterality: Right Cleanser: Soap and Water 1 x Per Day/30 Days Discharge Instructions: May shower and wash wound with dial antibacterial soap and water prior to dressing change. Cleanser: Vashe 5.8 (oz) 1 x Per Day/30 Days Discharge Instructions: Cleanse the wound with Vashe prior to applying a clean dressing using gauze sponges, not tissue or cotton balls. Cleanser: Wound Cleanser 1 x Per Day/30 Days Discharge Instructions: Cleanse the wound with wound cleanser prior to applying a clean dressing using gauze sponges, not tissue  hospital at the beginning and middle of August due to bleeding from her right groin. She has open wounds on her inguinal crease at the top of her right  thigh, but due to her severe contractures, these have been left essentially untreated. There is a strong fungal odor coming from her groin area. She has a new ulcer on her left greater trochanter. It is no deeper than an abrasion and is clean. Her sacral ulcer is nearly healed with just a tiny residual open area. Electronic Signature(s) Gibler, Marijke L ([id number]) [phone].pdf Page 2 of 9 Signed: 03/13/2023 10:09:08 AM By: Duanne Guess MD FACS Entered By: Duanne Guess on 03/13/2023 07:09:07 -------------------------------------------------------------------------------- Physical Exam Details Patient Name: Date of Service: [facility], [name] 03/13/2023 8:00 A M Medical Record Number: [id number] Patient Account Number: [id number] Date of Birth/Sex: Treating RN: 30-May-1961 (61 y.o. F) Primary Care Provider: PCP, NO Other Clinician: Referring Provider: Treating Provider/Extender: Cleophus Molt in Treatment: 0 Constitutional . . . . Chronically ill with severe contractures, but in no acute distress.Marland Kitchen Respiratory Normal work of breathing on room air. Musculoskeletal Severe contractures of all limbs except for the left upper extremity. Notes She has open wounds on her inguinal crease at the top of her right thigh. The tissue is raw and actively bleeding. There is a strong fungal odor coming from her groin area. She has a new ulcer on her left greater trochanter. It is no deeper than an abrasion and is clean. Her sacral ulcer is nearly healed with just a tiny residual open area. Electronic Signature(s) Signed: 03/13/2023 10:10:36 AM By: Duanne Guess MD FACS Entered By: Duanne Guess on 03/13/2023 07:10:35 -------------------------------------------------------------------------------- Physician Orders Details Patient Name: Date of Service: [name] [name], [name] 03/13/2023 8:00 A M Medical Record Number: [id number] Patient Account  Number: [id number] Date of Birth/Sex: Treating RN: 1960-08-24 (61 y.o. 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 - ++++ EXTRA TIME HOYER 90 mins +++++Dr. Lady Gary room 3 Return A Anesthetic Wound #4 Right Groin (In clinic) Topical Lidocaine 4% applied to wound bed Wound #5 Left Trochanter Bold, Cathryn L ([id number]) [id number] Page 3 of 9 (In clinic) Topical Lidocaine 4% applied to wound bed Wound #6 Sacrum (In clinic) Topical Lidocaine 4% applied to wound bed Bathing/ Shower/ Hygiene May shower and wash wound with soap and water. Additional Orders / Instructions Other: - No brief. Can put brief under the patient. Recommend Interdry pads in right groin Quick clot sent home with patient- use in right groin Nystatin around right groin wound Wound Treatment Wound #4 - Groin Wound Laterality: Right Cleanser: Soap and Water 1 x Per Day/30 Days Discharge Instructions: May shower and wash wound with dial antibacterial soap and water prior to dressing change. Cleanser: Vashe 5.8 (oz) 1 x Per Day/30 Days Discharge Instructions: Cleanse the wound with Vashe prior to applying a clean dressing using gauze sponges, not tissue or cotton balls. Cleanser: Wound Cleanser 1 x Per Day/30 Days Discharge Instructions: Cleanse the wound with wound cleanser prior to applying a clean dressing using gauze sponges, not tissue  hospital at the beginning and middle of August due to bleeding from her right groin. She has open wounds on her inguinal crease at the top of her right  thigh, but due to her severe contractures, these have been left essentially untreated. There is a strong fungal odor coming from her groin area. She has a new ulcer on her left greater trochanter. It is no deeper than an abrasion and is clean. Her sacral ulcer is nearly healed with just a tiny residual open area. Electronic Signature(s) Gibler, Marijke L ([id number]) [phone].pdf Page 2 of 9 Signed: 03/13/2023 10:09:08 AM By: Duanne Guess MD FACS Entered By: Duanne Guess on 03/13/2023 07:09:07 -------------------------------------------------------------------------------- Physical Exam Details Patient Name: Date of Service: [facility], [name] 03/13/2023 8:00 A M Medical Record Number: [id number] Patient Account Number: [id number] Date of Birth/Sex: Treating RN: 30-May-1961 (61 y.o. F) Primary Care Provider: PCP, NO Other Clinician: Referring Provider: Treating Provider/Extender: Cleophus Molt in Treatment: 0 Constitutional . . . . Chronically ill with severe contractures, but in no acute distress.Marland Kitchen Respiratory Normal work of breathing on room air. Musculoskeletal Severe contractures of all limbs except for the left upper extremity. Notes She has open wounds on her inguinal crease at the top of her right thigh. The tissue is raw and actively bleeding. There is a strong fungal odor coming from her groin area. She has a new ulcer on her left greater trochanter. It is no deeper than an abrasion and is clean. Her sacral ulcer is nearly healed with just a tiny residual open area. Electronic Signature(s) Signed: 03/13/2023 10:10:36 AM By: Duanne Guess MD FACS Entered By: Duanne Guess on 03/13/2023 07:10:35 -------------------------------------------------------------------------------- Physician Orders Details Patient Name: Date of Service: [name] [name], [name] 03/13/2023 8:00 A M Medical Record Number: [id number] Patient Account  Number: [id number] Date of Birth/Sex: Treating RN: 1960-08-24 (61 y.o. 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 - ++++ EXTRA TIME HOYER 90 mins +++++Dr. Lady Gary room 3 Return A Anesthetic Wound #4 Right Groin (In clinic) Topical Lidocaine 4% applied to wound bed Wound #5 Left Trochanter Bold, Cathryn L ([id number]) [id number] Page 3 of 9 (In clinic) Topical Lidocaine 4% applied to wound bed Wound #6 Sacrum (In clinic) Topical Lidocaine 4% applied to wound bed Bathing/ Shower/ Hygiene May shower and wash wound with soap and water. Additional Orders / Instructions Other: - No brief. Can put brief under the patient. Recommend Interdry pads in right groin Quick clot sent home with patient- use in right groin Nystatin around right groin wound Wound Treatment Wound #4 - Groin Wound Laterality: Right Cleanser: Soap and Water 1 x Per Day/30 Days Discharge Instructions: May shower and wash wound with dial antibacterial soap and water prior to dressing change. Cleanser: Vashe 5.8 (oz) 1 x Per Day/30 Days Discharge Instructions: Cleanse the wound with Vashe prior to applying a clean dressing using gauze sponges, not tissue or cotton balls. Cleanser: Wound Cleanser 1 x Per Day/30 Days Discharge Instructions: Cleanse the wound with wound cleanser prior to applying a clean dressing using gauze sponges, not tissue  or cotton balls. Peri-Wound Care: Nystop  Powder (Nystatin) 1 x Per Day/30 Days Discharge Instructions: around wound as needed.Apply Nystop (Nystatin) Powder as instructed Prim Dressing: Maxorb Extra Ag+ Alginate Dressing, 4x4.75 (in/in) 1 x Per Day/30 Days ary Discharge Instructions: Apply to wound bed as instructed Secondary Dressing: ABD Pad, 8x10 1 x Per Day/30 Days Discharge Instructions: Apply over primary dressing as directed. Secondary Dressing: Woven Gauze Sponge, Non-Sterile 4x4 in 1 x Per Day/30 Days Discharge Instructions: Apply over primary dressing as directed. Secured With: 91M Med[employer] Scientist, research (life sciences) Surgical T 2x10 (in/yd) 1 x Per Day/30 Days ape Discharge Instructions: If needed -Secure with tape as directed. Wound #5 - Trochanter Wound Laterality: Left Cleanser: Soap and Water 1 x Per Day/30 Days Discharge Instructions: May shower and wash wound with dial antibacterial soap and water prior to dressing change. Cleanser: Vashe 5.8 (oz) 1 x Per Day/30 Days Discharge Instructions: Cleanse the wound with Vashe prior to applying a clean dressing using gauze sponges, not tissue or cotton balls. Cleanser: Wound Cleanser 1 x Per Day/30 Days Discharge Instructions: Cleanse the wound with wound cleanser prior to applying a clean dressing using gauze sponges, not tissue or cotton balls. Prim Dressing: Maxorb Extra Ag+ Alginate Dressing, 2x2 (in/in) 1 x Per Day/30 Days ary Discharge Instructions: Apply to wound bed as instructed Secondary Dressing: Bordered Gauze, 2x3.75 in 1 x Per Day/30 Days Discharge Instructions: Apply over primary dressing as directed. Wound #6 - Sacrum Cleanser: Soap and Water 1 x Per Day/30 Days Discharge Instructions: May shower and wash wound with dial antibacterial soap and water prior to dressing change. Cleanser: Vashe 5.8 (oz) 1 x Per Day/30 Days Discharge Instructions: Cleanse the wound with Vashe prior to applying a clean dressing using gauze sponges, not tissue or cotton balls. Cleanser: Wound  Cleanser 1 x Per Day/30 Days Discharge Instructions: Cleanse the wound with wound cleanser prior to applying a clean dressing using gauze sponges, not tissue or cotton balls. Prim Dressing: Maxorb Extra Ag+ Alginate Dressing, 2x2 (in/in) 1 x Per Day/30 Days ary Discharge Instructions: Apply to wound bed as instructed Secondary Dressing: Bordered Gauze, 2x3.75 in 1 x Per Day/30 Days Discharge Instructions: Apply over primary dressing as directed. Electronic Signature(s) Signed: 03/13/2023 12:52:29 PM By: Duanne Guess MD FACS Call, Ellyce L ([id number]) PM By: Duanne Guess MD FACS [phone].pdf Page 4 of 9 Signed: 03/13/2023 12:52:29 Entered By: Duanne Guess on 03/13/2023 07:14:02 -------------------------------------------------------------------------------- Problem List Details Patient Name: Date of Service: [facility], [name] 03/13/2023 8:00 A M Medical Record Number: [id number] Patient Account Number: [id number] Date of Birth/Sex: Treating RN: Sep 06, 1960 (61 y.o. 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 03/13/2023 8:00 A M Medical Record Number: [id number] Patient Account Number: [id number] Date of Birth/Sex: Treating RN: [birth year] (61  or cotton balls. Peri-Wound Care: Nystop  Powder (Nystatin) 1 x Per Day/30 Days Discharge Instructions: around wound as needed.Apply Nystop (Nystatin) Powder as instructed Prim Dressing: Maxorb Extra Ag+ Alginate Dressing, 4x4.75 (in/in) 1 x Per Day/30 Days ary Discharge Instructions: Apply to wound bed as instructed Secondary Dressing: ABD Pad, 8x10 1 x Per Day/30 Days Discharge Instructions: Apply over primary dressing as directed. Secondary Dressing: Woven Gauze Sponge, Non-Sterile 4x4 in 1 x Per Day/30 Days Discharge Instructions: Apply over primary dressing as directed. Secured With: 91M Medipore Scientist, research (life sciences) Surgical T 2x10 (in/yd) 1 x Per Day/30 Days ape Discharge Instructions: If needed -Secure with tape as directed. Wound #5 - Trochanter Wound Laterality: Left Cleanser: Soap and Water 1 x Per Day/30 Days Discharge Instructions: May shower and wash wound with dial antibacterial soap and water prior to dressing change. Cleanser: Vashe 5.8 (oz) 1 x Per Day/30 Days Discharge Instructions: Cleanse the wound with Vashe prior to applying a clean dressing using gauze sponges, not tissue or cotton balls. Cleanser: Wound Cleanser 1 x Per Day/30 Days Discharge Instructions: Cleanse the wound with wound cleanser prior to applying a clean dressing using gauze sponges, not tissue or cotton balls. Prim Dressing: Maxorb Extra Ag+ Alginate Dressing, 2x2 (in/in) 1 x Per Day/30 Days ary Discharge Instructions: Apply to wound bed as instructed Secondary Dressing: Bordered Gauze, 2x3.75 in 1 x Per Day/30 Days Discharge Instructions: Apply over primary dressing as directed. Wound #6 - Sacrum Cleanser: Soap and Water 1 x Per Day/30 Days Discharge Instructions: May shower and wash wound with dial antibacterial soap and water prior to dressing change. Cleanser: Vashe 5.8 (oz) 1 x Per Day/30 Days Discharge Instructions: Cleanse the wound with Vashe prior to applying a clean dressing using gauze sponges, not tissue or cotton balls. Cleanser: Wound  Cleanser 1 x Per Day/30 Days Discharge Instructions: Cleanse the wound with wound cleanser prior to applying a clean dressing using gauze sponges, not tissue or cotton balls. Prim Dressing: Maxorb Extra Ag+ Alginate Dressing, 2x2 (in/in) 1 x Per Day/30 Days ary Discharge Instructions: Apply to wound bed as instructed Secondary Dressing: Bordered Gauze, 2x3.75 in 1 x Per Day/30 Days Discharge Instructions: Apply over primary dressing as directed. Electronic Signature(s) Signed: 03/13/2023 12:52:29 PM By: Duanne Guess MD FACS Call, Ellyce L ([id number]) PM By: Duanne Guess MD FACS [phone].pdf Page 4 of 9 Signed: 03/13/2023 12:52:29 Entered By: Duanne Guess on 03/13/2023 07:14:02 -------------------------------------------------------------------------------- Problem List Details Patient Name: Date of Service: [facility], [name] 03/13/2023 8:00 A M Medical Record Number: [id number] Patient Account Number: [id number] Date of Birth/Sex: Treating RN: Sep 06, 1960 (61 y.o. 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 03/13/2023 8:00 A M Medical Record Number: [id number] Patient Account Number: [id number] Date of Birth/Sex: Treating RN: [birth year] (61

## 2023-03-13 NOTE — Progress Notes (Signed)
Procter, Ari [name] ([id number]) [id number] Page 1 of 11 Visit Report for 03/13/2023 Allergy List Details Patient Name: Date of Service: [facility], [name]. 03/13/2023 8:00 A M Medical Record Number: [id number] Patient Account Number: [id number] Date of Birth/Sex: Treating RN: 1961/02/28 (61 y.o. [name] [name] Primary Care Margurite Duffy: PCP, NO Other Clinician: Referring [name] [name]: Treating [name] [name]/Extender: [name] [name] in Treatment: 0 Allergies Active Allergies Sulfamide Severity: Severe Ativan Bactrim Paxil Allergy Notes Electronic Signature(s) Signed: 03/13/2023 4:31:16 PM By: Karie Schwalbe RN Entered By: Karie Schwalbe on 03/13/2023 05:29:59 -------------------------------------------------------------------------------- Arrival Information Details Patient Name: Date of Service: [name] [name], [name]. 03/13/2023 8:00 A M Medical Record Number: [id number] Patient Account Number: [id number] Date of Birth/Sex: Treating RN: Sep 05, 1960 (61 y.o. [name] [name] Primary Care Jinan Biggins: PCP, NO Other Clinician: Referring Nylen Creque: Treating [name] [name]/Extender: [name] [name] in Treatment: 0 Visit Information Patient Arrived: Wheel Chair Arrival Time: 08:28 Accompanied By: caregiver Transfer Assistance: Michiel Sites Lift Patient Identification Verified: Yes History Since Last Visit Added or deleted any medications: No Any new allergies or adverse reactions: No Had a fall or experienced change in activities of daily living that may affect risk of falls: No Signs or symptoms of abuse/neglect since last visito No Hospitalized since last visit: No Implantable device outside of the clinic excluding cellular tissue based products placed in the center since last visit: No Electronic Signature(s) Signed: 03/13/2023 4:31:16 PM By: Karie Schwalbe RN Entered By: Karie Schwalbe on 03/13/2023 05:28:53 Esteve, [name] [name]  ([id number]) [id number] Page 2 of 11 -------------------------------------------------------------------------------- Clinic Level of Care Assessment Details Patient Name: Date of Service: [facility], [name]. 03/13/2023 8:00 A M Medical Record Number: [id number] Patient Account Number: [id number] Date of Birth/Sex: Treating RN: [birth year] (61 y.o. [name] [name] Primary Care Yug Loria: PCP, NO Other Clinician: Referring Genieve Ramaswamy: Treating Prerana Strayer/Extender: [name] [name] in Treatment: 0 Clinic Level of Care Assessment Items TOOL 2 Quantity Score X- 1 0 Use when only an EandM is performed on the INITIAL visit ASSESSMENTS - Nursing Assessment / Reassessment X- 1 20 General Physical Exam (combine w/ comprehensive assessment (listed just below) when performed on new pt. evals) X- 1 25 Comprehensive Assessment (HX, ROS, Risk Assessments, Wounds Hx, etc.) ASSESSMENTS - Wound and Skin A ssessment / Reassessment []  - 0 Simple Wound Assessment / Reassessment - one wound X- 3 5 Complex Wound Assessment / Reassessment - multiple wounds []  - 0 Dermatologic / Skin Assessment (not related to wound area) ASSESSMENTS - Ostomy and/or Continence Assessment and Care []  - 0 Incontinence Assessment and Management []  - 0 Ostomy Care Assessment and Management (repouching, etc.) PROCESS - Coordination of Care []  - 0 Simple Patient / Family Education for ongoing care X- 1 20 Complex (extensive) Patient / Family Education for ongoing care X- 1 10 Staff obtains Chiropractor, Records, T Results / Process Orders est X- 1 10 Staff telephones HHA, Nursing Homes / Clarify orders / etc []  - 0 Routine Transfer to another Facility (non-emergent condition) []  - 0 Routine Hospital Admission (non-emergent condition) []  - 0 New Admissions / Manufacturing engineer / Ordering NPWT Apligraf, etc. , []  - 0 Emergency Hospital Admission (emergent condition) []   - 0 Simple Discharge Coordination X- 1 15 Complex (extensive) Discharge Coordination PROCESS - Special Needs []  - 0 Pediatric / Minor Patient Management []  - 0 Isolation Patient Management []  - 0 Hearing / Language / Visual special needs X- 1 15 Assessment of Community assistance (transportation, D/C  Procter, Ari [name] ([id number]) [id number] Page 1 of 11 Visit Report for 03/13/2023 Allergy List Details Patient Name: Date of Service: [facility], [name]. 03/13/2023 8:00 A M Medical Record Number: [id number] Patient Account Number: [id number] Date of Birth/Sex: Treating RN: 1961/02/28 (61 y.o. [name] [name] Primary Care Margurite Duffy: PCP, NO Other Clinician: Referring [name] [name]: Treating [name] [name]/Extender: [name] [name] in Treatment: 0 Allergies Active Allergies Sulfamide Severity: Severe Ativan Bactrim Paxil Allergy Notes Electronic Signature(s) Signed: 03/13/2023 4:31:16 PM By: Karie Schwalbe RN Entered By: Karie Schwalbe on 03/13/2023 05:29:59 -------------------------------------------------------------------------------- Arrival Information Details Patient Name: Date of Service: [name] [name], [name]. 03/13/2023 8:00 A M Medical Record Number: [id number] Patient Account Number: [id number] Date of Birth/Sex: Treating RN: Sep 05, 1960 (61 y.o. [name] [name] Primary Care Jinan Biggins: PCP, NO Other Clinician: Referring Nylen Creque: Treating [name] [name]/Extender: [name] [name] in Treatment: 0 Visit Information Patient Arrived: Wheel Chair Arrival Time: 08:28 Accompanied By: caregiver Transfer Assistance: Michiel Sites Lift Patient Identification Verified: Yes History Since Last Visit Added or deleted any medications: No Any new allergies or adverse reactions: No Had a fall or experienced change in activities of daily living that may affect risk of falls: No Signs or symptoms of abuse/neglect since last visito No Hospitalized since last visit: No Implantable device outside of the clinic excluding cellular tissue based products placed in the center since last visit: No Electronic Signature(s) Signed: 03/13/2023 4:31:16 PM By: Karie Schwalbe RN Entered By: Karie Schwalbe on 03/13/2023 05:28:53 Esteve, [name] [name]  ([id number]) [id number] Page 2 of 11 -------------------------------------------------------------------------------- Clinic Level of Care Assessment Details Patient Name: Date of Service: [facility], [name]. 03/13/2023 8:00 A M Medical Record Number: [id number] Patient Account Number: [id number] Date of Birth/Sex: Treating RN: [birth year] (61 y.o. [name] [name] Primary Care Yug Loria: PCP, NO Other Clinician: Referring Genieve Ramaswamy: Treating Prerana Strayer/Extender: [name] [name] in Treatment: 0 Clinic Level of Care Assessment Items TOOL 2 Quantity Score X- 1 0 Use when only an EandM is performed on the INITIAL visit ASSESSMENTS - Nursing Assessment / Reassessment X- 1 20 General Physical Exam (combine w/ comprehensive assessment (listed just below) when performed on new pt. evals) X- 1 25 Comprehensive Assessment (HX, ROS, Risk Assessments, Wounds Hx, etc.) ASSESSMENTS - Wound and Skin A ssessment / Reassessment []  - 0 Simple Wound Assessment / Reassessment - one wound X- 3 5 Complex Wound Assessment / Reassessment - multiple wounds []  - 0 Dermatologic / Skin Assessment (not related to wound area) ASSESSMENTS - Ostomy and/or Continence Assessment and Care []  - 0 Incontinence Assessment and Management []  - 0 Ostomy Care Assessment and Management (repouching, etc.) PROCESS - Coordination of Care []  - 0 Simple Patient / Family Education for ongoing care X- 1 20 Complex (extensive) Patient / Family Education for ongoing care X- 1 10 Staff obtains Chiropractor, Records, T Results / Process Orders est X- 1 10 Staff telephones HHA, Nursing Homes / Clarify orders / etc []  - 0 Routine Transfer to another Facility (non-emergent condition) []  - 0 Routine Hospital Admission (non-emergent condition) []  - 0 New Admissions / Manufacturing engineer / Ordering NPWT Apligraf, etc. , []  - 0 Emergency Hospital Admission (emergent condition) []   - 0 Simple Discharge Coordination X- 1 15 Complex (extensive) Discharge Coordination PROCESS - Special Needs []  - 0 Pediatric / Minor Patient Management []  - 0 Isolation Patient Management []  - 0 Hearing / Language / Visual special needs X- 1 15 Assessment of Community assistance (transportation, D/C  16.336 0.55 0.864 A (cm) : rea 19.604 0.055 0.259 Volume (cm) : Full Thickness Without Exposed Full Thickness Without Exposed Category/Stage II Classification: Support Structures Support Structures Large Medium Medium Exudate Amount: Sanguinous Serosanguineous Serosanguineous Exudate Type: red red, brown red, brown Exudate Color: Large (67-100%) Large (67-100%) Large (67-100%) Granulation Amount: Red, Hyper-granulation, Friable Red Red Granulation Quality: Small (1-33%) Small (1-33%) Small (1-33%) Necrotic Amount: Fat Layer (Subcutaneous Tissue): Yes Fat Layer (Subcutaneous Tissue): Yes Fat Layer (Subcutaneous Tissue): Yes Exposed Structures: Fascia: No Fascia: No Tendon: No Tendon: No Muscle: No Muscle: No Joint: No Joint: No Bone: No Bone: No Small (1-33%) Small (1-33%) Small (1-33%) Epithelialization: Scarring: Yes Scarring: Yes No Abnormalities Noted Periwound Skin Texture: Maceration: Yes No Abnormalities Noted No Abnormalities Noted Periwound Skin Moisture: Dry/Scaly: No Erythema: No No Abnormalities Noted Periwound Skin Color: No Abnormality No Abnormality No  Abnormality Temperature: Chemical Cauterization N/A N/A Procedures Performed: He, Lizanne [name] ([id number]) [id number] Page 5 of 11 Treatment Notes Electronic Signature(s) Signed: 03/13/2023 10:05:28 AM By: Duanne Guess MD FACS Entered By: Duanne Guess on 03/13/2023 07:05:28 -------------------------------------------------------------------------------- Multi-Disciplinary Care Plan Details Patient Name: Date of Service: [facility], [name]. 03/13/2023 8:00 A M Medical Record Number: [id number] Patient Account Number: [id number] Date of Birth/Sex: Treating RN: 1961/02/25 (61 y.o. [name] [name] Primary Care Lujuana Kapler: PCP, NO Other Clinician: Referring Torren Maffeo: Treating Rashaun Curl/Extender: [name] [name] in Treatment: 0 Active Inactive Wound/Skin Impairment Nursing Diagnoses: Impaired tissue integrity Goals: Patient/caregiver will verbalize understanding of skin care regimen Date Initiated: 03/13/2023 Target Resolution Date: 06/26/2023 Goal Status: Active Interventions: Assess ulceration(s) every visit Treatment Activities: Skin care regimen initiated : 03/13/2023 Notes: Electronic Signature(s) Signed: 03/13/2023 4:31:16 PM By: Karie Schwalbe RN Entered By: Karie Schwalbe on 03/13/2023 12:45:25 -------------------------------------------------------------------------------- Pain Assessment Details Patient Name: Date of Service: [name] [name], [name]. 03/13/2023 8:00 A M Medical Record Number: [id number] Patient Account Number: [id number] Date of Birth/Sex: Treating RN: April 25, 1961 (61 y.o. [name] [name] Primary Care Thessaly Mccullers: PCP, NO Other Clinician: Referring Shasha Buchbinder: Treating Marifer Hurd/Extender: [name] [name] in Treatment: 0 Active Problems Location of Pain Severity and Description of Pain Patient Has Paino Yes Site Locations Pain Location: Rico, Carmell [name] ([id number])  [id number] Page 6 of 11 Pain Location: Generalized Pain With Dressing Change: No Duration of the Pain. Constant / Intermittento Constant Rate the pain. Current Pain Level: 8 Worst Pain Level: 10 Least Pain Level: 3 Tolerable Pain Level: 3 Character of Pain Describe the Pain: Burning, Tender Pain Management and Medication Current Pain Management: Medication: Yes Cold Application: No Rest: Yes Massage: No Activity: No T.E.N.S.: No Heat Application: No Leg drop or elevation: No Is the Current Pain Management Adequate: Adequate Electronic Signature(s) Signed: 03/13/2023 4:31:16 PM By: Karie Schwalbe RN Entered By: Karie Schwalbe on 03/13/2023 12:43:03 -------------------------------------------------------------------------------- Patient/Caregiver Education Details Patient Name: Date of Service: [name] [name], [name]. 9/17/2024andnbsp8:00 A M Medical Record Number: [id number] Patient Account Number: [id number] Date of Birth/Gender: Treating RN: [birth year] (61 y.o. [name] [name] Primary Care Physician: PCP, NO Other Clinician: Referring Physician: Treating Physician/Extender: [name] [name] in Treatment: 0 Education Assessment Education Provided To: Patient and Caregiver Education Topics Provided Wound/Skin Impairment: Methods: Demonstration Responses: State content correctly Electronic Signature(s) Signed: 03/13/2023 4:31:16 PM By: Karie Schwalbe RN Entered By: Karie Schwalbe on 03/13/2023 12:45:42 Frommer, [name] [name] ([id number]) [id number] Page 7 of 11 -------------------------------------------------------------------------------- Wound Assessment Details Patient Name: Date of Service: [facility], [name]. 03/13/2023 8:00 A M Medical  Procter, Ari [name] ([id number]) [id number] Page 1 of 11 Visit Report for 03/13/2023 Allergy List Details Patient Name: Date of Service: [facility], [name]. 03/13/2023 8:00 A M Medical Record Number: [id number] Patient Account Number: [id number] Date of Birth/Sex: Treating RN: 1961/02/28 (61 y.o. [name] [name] Primary Care Margurite Duffy: PCP, NO Other Clinician: Referring [name] [name]: Treating [name] [name]/Extender: [name] [name] in Treatment: 0 Allergies Active Allergies Sulfamide Severity: Severe Ativan Bactrim Paxil Allergy Notes Electronic Signature(s) Signed: 03/13/2023 4:31:16 PM By: Karie Schwalbe RN Entered By: Karie Schwalbe on 03/13/2023 05:29:59 -------------------------------------------------------------------------------- Arrival Information Details Patient Name: Date of Service: [name] [name], [name]. 03/13/2023 8:00 A M Medical Record Number: [id number] Patient Account Number: [id number] Date of Birth/Sex: Treating RN: Sep 05, 1960 (61 y.o. [name] [name] Primary Care Jinan Biggins: PCP, NO Other Clinician: Referring Nylen Creque: Treating [name] [name]/Extender: [name] [name] in Treatment: 0 Visit Information Patient Arrived: Wheel Chair Arrival Time: 08:28 Accompanied By: caregiver Transfer Assistance: Michiel Sites Lift Patient Identification Verified: Yes History Since Last Visit Added or deleted any medications: No Any new allergies or adverse reactions: No Had a fall or experienced change in activities of daily living that may affect risk of falls: No Signs or symptoms of abuse/neglect since last visito No Hospitalized since last visit: No Implantable device outside of the clinic excluding cellular tissue based products placed in the center since last visit: No Electronic Signature(s) Signed: 03/13/2023 4:31:16 PM By: Karie Schwalbe RN Entered By: Karie Schwalbe on 03/13/2023 05:28:53 Esteve, [name] [name]  ([id number]) [id number] Page 2 of 11 -------------------------------------------------------------------------------- Clinic Level of Care Assessment Details Patient Name: Date of Service: [facility], [name]. 03/13/2023 8:00 A M Medical Record Number: [id number] Patient Account Number: [id number] Date of Birth/Sex: Treating RN: [birth year] (61 y.o. [name] [name] Primary Care Yug Loria: PCP, NO Other Clinician: Referring Genieve Ramaswamy: Treating Prerana Strayer/Extender: [name] [name] in Treatment: 0 Clinic Level of Care Assessment Items TOOL 2 Quantity Score X- 1 0 Use when only an EandM is performed on the INITIAL visit ASSESSMENTS - Nursing Assessment / Reassessment X- 1 20 General Physical Exam (combine w/ comprehensive assessment (listed just below) when performed on new pt. evals) X- 1 25 Comprehensive Assessment (HX, ROS, Risk Assessments, Wounds Hx, etc.) ASSESSMENTS - Wound and Skin A ssessment / Reassessment []  - 0 Simple Wound Assessment / Reassessment - one wound X- 3 5 Complex Wound Assessment / Reassessment - multiple wounds []  - 0 Dermatologic / Skin Assessment (not related to wound area) ASSESSMENTS - Ostomy and/or Continence Assessment and Care []  - 0 Incontinence Assessment and Management []  - 0 Ostomy Care Assessment and Management (repouching, etc.) PROCESS - Coordination of Care []  - 0 Simple Patient / Family Education for ongoing care X- 1 20 Complex (extensive) Patient / Family Education for ongoing care X- 1 10 Staff obtains Chiropractor, Records, T Results / Process Orders est X- 1 10 Staff telephones HHA, Nursing Homes / Clarify orders / etc []  - 0 Routine Transfer to another Facility (non-emergent condition) []  - 0 Routine Hospital Admission (non-emergent condition) []  - 0 New Admissions / Manufacturing engineer / Ordering NPWT Apligraf, etc. , []  - 0 Emergency Hospital Admission (emergent condition) []   - 0 Simple Discharge Coordination X- 1 15 Complex (extensive) Discharge Coordination PROCESS - Special Needs []  - 0 Pediatric / Minor Patient Management []  - 0 Isolation Patient Management []  - 0 Hearing / Language / Visual special needs X- 1 15 Assessment of Community assistance (transportation, D/C  16.336 0.55 0.864 A (cm) : rea 19.604 0.055 0.259 Volume (cm) : Full Thickness Without Exposed Full Thickness Without Exposed Category/Stage II Classification: Support Structures Support Structures Large Medium Medium Exudate Amount: Sanguinous Serosanguineous Serosanguineous Exudate Type: red red, brown red, brown Exudate Color: Large (67-100%) Large (67-100%) Large (67-100%) Granulation Amount: Red, Hyper-granulation, Friable Red Red Granulation Quality: Small (1-33%) Small (1-33%) Small (1-33%) Necrotic Amount: Fat Layer (Subcutaneous Tissue): Yes Fat Layer (Subcutaneous Tissue): Yes Fat Layer (Subcutaneous Tissue): Yes Exposed Structures: Fascia: No Fascia: No Tendon: No Tendon: No Muscle: No Muscle: No Joint: No Joint: No Bone: No Bone: No Small (1-33%) Small (1-33%) Small (1-33%) Epithelialization: Scarring: Yes Scarring: Yes No Abnormalities Noted Periwound Skin Texture: Maceration: Yes No Abnormalities Noted No Abnormalities Noted Periwound Skin Moisture: Dry/Scaly: No Erythema: No No Abnormalities Noted Periwound Skin Color: No Abnormality No Abnormality No  Abnormality Temperature: Chemical Cauterization N/A N/A Procedures Performed: He, Lizanne [name] ([id number]) [id number] Page 5 of 11 Treatment Notes Electronic Signature(s) Signed: 03/13/2023 10:05:28 AM By: Duanne Guess MD FACS Entered By: Duanne Guess on 03/13/2023 07:05:28 -------------------------------------------------------------------------------- Multi-Disciplinary Care Plan Details Patient Name: Date of Service: [facility], [name]. 03/13/2023 8:00 A M Medical Record Number: [id number] Patient Account Number: [id number] Date of Birth/Sex: Treating RN: 1961/02/25 (61 y.o. [name] [name] Primary Care Lujuana Kapler: PCP, NO Other Clinician: Referring Torren Maffeo: Treating Rashaun Curl/Extender: [name] [name] in Treatment: 0 Active Inactive Wound/Skin Impairment Nursing Diagnoses: Impaired tissue integrity Goals: Patient/caregiver will verbalize understanding of skin care regimen Date Initiated: 03/13/2023 Target Resolution Date: 06/26/2023 Goal Status: Active Interventions: Assess ulceration(s) every visit Treatment Activities: Skin care regimen initiated : 03/13/2023 Notes: Electronic Signature(s) Signed: 03/13/2023 4:31:16 PM By: Karie Schwalbe RN Entered By: Karie Schwalbe on 03/13/2023 12:45:25 -------------------------------------------------------------------------------- Pain Assessment Details Patient Name: Date of Service: [name] [name], [name]. 03/13/2023 8:00 A M Medical Record Number: [id number] Patient Account Number: [id number] Date of Birth/Sex: Treating RN: April 25, 1961 (61 y.o. [name] [name] Primary Care Thessaly Mccullers: PCP, NO Other Clinician: Referring Shasha Buchbinder: Treating Marifer Hurd/Extender: [name] [name] in Treatment: 0 Active Problems Location of Pain Severity and Description of Pain Patient Has Paino Yes Site Locations Pain Location: Rico, Carmell [name] ([id number])  [id number] Page 6 of 11 Pain Location: Generalized Pain With Dressing Change: No Duration of the Pain. Constant / Intermittento Constant Rate the pain. Current Pain Level: 8 Worst Pain Level: 10 Least Pain Level: 3 Tolerable Pain Level: 3 Character of Pain Describe the Pain: Burning, Tender Pain Management and Medication Current Pain Management: Medication: Yes Cold Application: No Rest: Yes Massage: No Activity: No T.E.N.S.: No Heat Application: No Leg drop or elevation: No Is the Current Pain Management Adequate: Adequate Electronic Signature(s) Signed: 03/13/2023 4:31:16 PM By: Karie Schwalbe RN Entered By: Karie Schwalbe on 03/13/2023 12:43:03 -------------------------------------------------------------------------------- Patient/Caregiver Education Details Patient Name: Date of Service: [name] [name], [name]. 9/17/2024andnbsp8:00 A M Medical Record Number: [id number] Patient Account Number: [id number] Date of Birth/Gender: Treating RN: [birth year] (61 y.o. [name] [name] Primary Care Physician: PCP, NO Other Clinician: Referring Physician: Treating Physician/Extender: [name] [name] in Treatment: 0 Education Assessment Education Provided To: Patient and Caregiver Education Topics Provided Wound/Skin Impairment: Methods: Demonstration Responses: State content correctly Electronic Signature(s) Signed: 03/13/2023 4:31:16 PM By: Karie Schwalbe RN Entered By: Karie Schwalbe on 03/13/2023 12:45:42 Frommer, [name] [name] ([id number]) [id number] Page 7 of 11 -------------------------------------------------------------------------------- Wound Assessment Details Patient Name: Date of Service: [facility], [name]. 03/13/2023 8:00 A M Medical  16.336 0.55 0.864 A (cm) : rea 19.604 0.055 0.259 Volume (cm) : Full Thickness Without Exposed Full Thickness Without Exposed Category/Stage II Classification: Support Structures Support Structures Large Medium Medium Exudate Amount: Sanguinous Serosanguineous Serosanguineous Exudate Type: red red, brown red, brown Exudate Color: Large (67-100%) Large (67-100%) Large (67-100%) Granulation Amount: Red, Hyper-granulation, Friable Red Red Granulation Quality: Small (1-33%) Small (1-33%) Small (1-33%) Necrotic Amount: Fat Layer (Subcutaneous Tissue): Yes Fat Layer (Subcutaneous Tissue): Yes Fat Layer (Subcutaneous Tissue): Yes Exposed Structures: Fascia: No Fascia: No Tendon: No Tendon: No Muscle: No Muscle: No Joint: No Joint: No Bone: No Bone: No Small (1-33%) Small (1-33%) Small (1-33%) Epithelialization: Scarring: Yes Scarring: Yes No Abnormalities Noted Periwound Skin Texture: Maceration: Yes No Abnormalities Noted No Abnormalities Noted Periwound Skin Moisture: Dry/Scaly: No Erythema: No No Abnormalities Noted Periwound Skin Color: No Abnormality No Abnormality No  Abnormality Temperature: Chemical Cauterization N/A N/A Procedures Performed: He, Lizanne [name] ([id number]) [id number] Page 5 of 11 Treatment Notes Electronic Signature(s) Signed: 03/13/2023 10:05:28 AM By: Duanne Guess MD FACS Entered By: Duanne Guess on 03/13/2023 07:05:28 -------------------------------------------------------------------------------- Multi-Disciplinary Care Plan Details Patient Name: Date of Service: [facility], [name]. 03/13/2023 8:00 A M Medical Record Number: [id number] Patient Account Number: [id number] Date of Birth/Sex: Treating RN: 1961/02/25 (61 y.o. [name] [name] Primary Care Lujuana Kapler: PCP, NO Other Clinician: Referring Torren Maffeo: Treating Rashaun Curl/Extender: [name] [name] in Treatment: 0 Active Inactive Wound/Skin Impairment Nursing Diagnoses: Impaired tissue integrity Goals: Patient/caregiver will verbalize understanding of skin care regimen Date Initiated: 03/13/2023 Target Resolution Date: 06/26/2023 Goal Status: Active Interventions: Assess ulceration(s) every visit Treatment Activities: Skin care regimen initiated : 03/13/2023 Notes: Electronic Signature(s) Signed: 03/13/2023 4:31:16 PM By: Karie Schwalbe RN Entered By: Karie Schwalbe on 03/13/2023 12:45:25 -------------------------------------------------------------------------------- Pain Assessment Details Patient Name: Date of Service: [name] [name], [name]. 03/13/2023 8:00 A M Medical Record Number: [id number] Patient Account Number: [id number] Date of Birth/Sex: Treating RN: April 25, 1961 (61 y.o. [name] [name] Primary Care Thessaly Mccullers: PCP, NO Other Clinician: Referring Shasha Buchbinder: Treating Marifer Hurd/Extender: [name] [name] in Treatment: 0 Active Problems Location of Pain Severity and Description of Pain Patient Has Paino Yes Site Locations Pain Location: [place], Carmell [name] ([id number])  [id number] Page 6 of 11 Pain Location: Generalized Pain With Dressing Change: No Duration of the Pain. Constant / Intermittento Constant Rate the pain. Current Pain Level: 8 Worst Pain Level: 10 Least Pain Level: 3 Tolerable Pain Level: 3 Character of Pain Describe the Pain: Burning, Tender Pain Management and Medication Current Pain Management: Medication: Yes Cold Application: No Rest: Yes Massage: No Activity: No T.E.N.S.: No Heat Application: No Leg drop or elevation: No Is the Current Pain Management Adequate: Adequate Electronic Signature(s) Signed: 03/13/2023 4:31:16 PM By: Karie Schwalbe RN Entered By: Karie Schwalbe on 03/13/2023 12:43:03 -------------------------------------------------------------------------------- Patient/Caregiver Education Details Patient Name: Date of Service: [name] [name], [name]. 9/17/2024andnbsp8:00 A M Medical Record Number: [id number] Patient Account Number: [id number] Date of Birth/Gender: Treating RN: [birth year] (61 y.o. [name] [name] Primary Care Physician: PCP, NO Other Clinician: Referring Physician: Treating Physician/Extender: [name] [name] in Treatment: 0 Education Assessment Education Provided To: Patient and Caregiver Education Topics Provided Wound/Skin Impairment: Methods: Demonstration Responses: State content correctly Electronic Signature(s) Signed: 03/13/2023 4:31:16 PM By: Karie Schwalbe RN Entered By: Karie Schwalbe on 03/13/2023 12:45:42 Frommer, [name] [name] ([id number]) [id number] Page 7 of 11 -------------------------------------------------------------------------------- Wound Assessment Details Patient Name: Date of Service: [facility], [name]. 03/13/2023 8:00 A M Medical

## 2023-03-13 NOTE — Progress Notes (Signed)
[name], [name] [name] ([id number]) [id number] Page 1 of 4 Visit Report for 03/13/2023 Abuse Risk Screen [name] Patient Name: Date of Service: [name] [name], [name] [name]. 03/13/2023 8:00 [name] [name] Medical Record Number: [id number] Patient Account Number: [id number] Date of Birth/Sex: Treating [name]: 14-Mar-1961 (61 y.o. [name] [name] [name] Care [name] [name]: PCP, NO Other Clinician: Referring [name] [name]: Treating [name] [name]/Extender: [name] [name] in Treatment: 0 Abuse Risk Screen Items Answer ABUSE RISK SCREEN: Has anyone close to you tried to hurt or harm you recentlyo No Do you feel uncomfortable with anyone in your familyo No Has anyone forced you do things that you didnt want to doo No Electronic Signature(s) Signed: 03/13/2023 4:31:16 PM By: [name] [name] Entered By: [name] [name] on 03/13/2023 12:39:41 -------------------------------------------------------------------------------- Activities of Daily Living [name] Patient Name: Date of Service: [name] [name], [name] [name]. 03/13/2023 8:00 [name] [name] Medical Record Number: [id number] Patient Account Number: [id number] Date of Birth/Sex: Treating [name]: Jun 01, 1961 (61 y.o. [name] [name] [name] [name]: PCP, NO Other Clinician: Referring [name] [name]: Treating [name] [name]: [name] [name] in Treatment: 0 Activities of Daily Living Items Answer Activities of Daily Living (Please select one for each item) Drive Automobile Not Able T Medications ake Need Assistance Use T elephone Need Assistance Care for Appearance Not Able Use T oilet Not Able Bath / Shower Not Able Dress Self Not Able Feed Self Need Assistance Walk Not Able Get In / Out Bed Not Able Housework Not Able Prepare Meals Not Able Handle Money Not Able Shop for Self Not Able Electronic Signature(s) Signed: 03/13/2023 4:31:16 PM By: [name] [name] Entered By: [name] [name] on 03/13/2023  12:40:23 [name] [name], [name] [name] ([id number]) [id number] Page 2 of 4 -------------------------------------------------------------------------------- Education Screening [name] Patient Name: Date of Service: [name] [name], [name] [name]. 03/13/2023 8:00 [name] [name] Medical Record Number: [id number] Patient Account Number: [id number] Date of Birth/Sex: Treating [name]: 04-13-1961 (61 y.o. [name] [name] [name] Care [name] [name]: PCP, NO Other Clinician: Referring [name] [name]: Treating [name] [name]/Extender: [name] [name] in Treatment: 0 [name] Learner Assessed: Patient Learning Preferences/Education Level/[name] Language Learning Preference: Explanation, Demonstration, Printed Material Highest Education Level: High School Preferred Language: English Cognitive Barrier Language Barrier: No Translator Needed: No Memory Deficit: No Emotional Barrier: No Cultural/Religious Beliefs Affecting Medical Care: No Physical Barrier Impaired Vision: No Impaired Hearing: No Decreased Hand dexterity: No Knowledge/Comprehension Knowledge Level: High Comprehension Level: High Ability to understand written instructions: High Ability to understand verbal instructions: High Motivation Anxiety Level: Calm Cooperation: Cooperative Education Importance: Acknowledges Need Interest in Health Problems: Asks Questions Perception: Coherent Willingness to Engage in Self-Management High Activities: Readiness to Engage in Self-Management High Activities: Electronic Signature(s) Signed: 03/13/2023 4:31:16 PM By: [name] [name] Entered By: [name] [name] on 03/13/2023 12:40:52 -------------------------------------------------------------------------------- Fall Risk Assessment [name] Patient Name: Date of Service: [name] [name], [name] [name]. 03/13/2023 8:00 [name] [name] Medical Record Number: [id number] Patient Account Number: [id number] Date of Birth/Sex: Treating [name]: Jun 09, 1961 (61 y.o.  [name] [name] [name] Care [name] [name]: PCP, NO Other Clinician: Referring [name] [name]: Treating [name] [name]/Extender: [name] [name] in Treatment: 0 Fall Risk Assessment Items Have you had 2 or more falls in the last 12 monthso 0 No [name] [name], [name] [name] ([id number]) [id number] Page 3 of 4 Have you had any fall that resulted in injury in the last 12 monthso 0 No FALLS RISK SCREEN History of falling - immediate or within 3 months 0 No Secondary diagnosis (Do you have 2 or more medical diagnoseso)  0 No Ambulatory aid None/bed rest/wheelchair/nurse 0 No Crutches/cane/walker 0 No Furniture 0 No Intravenous therapy Access/Saline/Heparin Lock 0 No Gait/Transferring Normal/ bed rest/ wheelchair 0 No Weak (short steps with or without shuffle, stooped but able to lift head while walking, may seek 0 No support from furniture) Impaired (short steps with shuffle, may have difficulty arising from chair, head down, impaired 0 No balance) Mental Status Oriented to own ability 0 No Electronic Signature(s) Signed: 03/13/2023 4:31:16 PM By: [name] [name] Entered By: [name] [name] on 03/13/2023 12:40:59 -------------------------------------------------------------------------------- [name] [name] Patient Name: Date of Service: [name] [name], [name] [name]. 03/13/2023 8:00 [name] [name] Medical Record Number: [id number] Patient Account Number: [id number] Date of Birth/Sex: Treating [name]: 1960/12/01 (61 y.o. [name] [name] [name] Care [name] [name]: PCP, NO Other Clinician: Referring [name] [name]: Treating [name] [name]: [name] [name] in Treatment: 0 [name] Assessment Items Site Locations + = Sensation present, - = Sensation absent, C = Callus, U = Ulcer R = Redness, W = Warmth, [name] = Maceration, PU = Pre-ulcerative lesion F = Fissure, S = Swelling, D = Dryness Assessment Right: Left: Other Deformity: No No Prior [name] Ulcer: No  No Prior Amputation: No No Charcot Joint: No No Ambulatory Status: Non-ambulatory Assistance Device: Wheelchair [name], [name] [name] ([id number]) [phone] [id number] Page 4 of 4 Gait: Surveyor, mining) Signed: 03/13/2023 4:31:16 PM By: [name] [name] Entered By: [name] [name] on 03/13/2023 12:42:05 -------------------------------------------------------------------------------- Nutrition Risk Screening [name] Patient Name: Date of Service: [name] [name], [name] [name]. 03/13/2023 8:00 [name] [name] Medical Record Number: [id number] Patient Account Number: [id number] Date of Birth/Sex: Treating [name]: 1960/08/22 (61 y.o. [name] [name] [name] Care Donielle Radziewicz: PCP, NO Other Clinician: Referring [name] [name]: Treating [name] Parkin/Extender: [name] [name] in Treatment: 0 Height (in): 67 Weight (lbs): 110 Body Mass Index (BMI): 17.2 Nutrition Risk Screening Items Score Screening NUTRITION RISK SCREEN: I have an illness or condition that made me change the kind and/or amount of food I eat 0 No I eat fewer than two meals per day 0 No I eat few fruits and vegetables, or milk products 0 No I have three or more drinks of beer, liquor or wine almost every day 0 No I have tooth or mouth problems that make it hard for me to eat 0 No I don't always have enough money to buy the food I need 0 No I eat alone most of the time 0 No I take three or more different prescribed or over-the-counter drugs [name] day 1 Yes Without wanting to, I have lost or gained 10 pounds in the last six months 0 No I am not always physically able to shop, cook and/or feed myself 2 Yes Nutrition Protocols Good Risk Protocol Moderate Risk Protocol 0 Provide education on nutrition High Risk Proctocol Risk Level: Moderate Risk Score: 3 Electronic Signature(s) Signed: 03/13/2023 4:31:16 PM By: [name] [name] Entered By: [name] [name] on 03/13/2023 12:41:51

## 2023-03-27 ENCOUNTER — Ambulatory Visit (HOSPITAL_BASED_OUTPATIENT_CLINIC_OR_DEPARTMENT_OTHER): Payer: Medicaid Other | Admitting: General Surgery

## 2023-04-04 ENCOUNTER — Encounter (HOSPITAL_BASED_OUTPATIENT_CLINIC_OR_DEPARTMENT_OTHER): Payer: Medicaid Other | Attending: General Surgery | Admitting: General Surgery

## 2023-04-04 DIAGNOSIS — L89222 Pressure ulcer of left hip, stage 2: Secondary | ICD-10-CM | POA: Diagnosis not present

## 2023-04-04 DIAGNOSIS — M109 Gout, unspecified: Secondary | ICD-10-CM | POA: Diagnosis not present

## 2023-04-04 DIAGNOSIS — L89154 Pressure ulcer of sacral region, stage 4: Secondary | ICD-10-CM | POA: Diagnosis not present

## 2023-04-04 DIAGNOSIS — L97112 Non-pressure chronic ulcer of right thigh with fat layer exposed: Secondary | ICD-10-CM | POA: Diagnosis present

## 2023-04-04 DIAGNOSIS — G8929 Other chronic pain: Secondary | ICD-10-CM | POA: Diagnosis not present

## 2023-04-04 DIAGNOSIS — L89321 Pressure ulcer of left buttock, stage 1: Secondary | ICD-10-CM | POA: Insufficient documentation

## 2023-04-04 DIAGNOSIS — Z86711 Personal history of pulmonary embolism: Secondary | ICD-10-CM | POA: Insufficient documentation

## 2023-04-05 NOTE — Progress Notes (Signed)
wound bed as instructed Secondary Dressing: Bordered Gauze, 2x3.75 in 1 x Per Day/30 Days Discharge Instructions: Apply over primary dressing as directed. 04/04/2023: The ulcer on her left trochanter has gotten larger and deeper. There is a layer of leathery eschar on the surface. The sacral ulcer is stable. She still has a strong fungal odor coming from her groin area. There is also bleeding coming from the open wound at the very depths of her contracted hip area. I used a curette to debride the leathery eschar off of the left trochanter ulcer. I chemically cauterized the bleeding granulation tissue down in her groin area. The sacral ulcer did not require debridement. Will continue silver alginate on the sacrum. She should continue to avoid wearing a brief. Continue miconazole powder to the inguinal fold areas with silver alginate tucked into the wound area. The trochanter surface remains fairly leathery and the patient was unable to tolerate further debridement so we will use Santyl to try and break up this tissue a bit more to permit healing. Follow-up in 2 weeks. Electronic Signature(s) Signed: 04/04/2023 10:54:24 AM By: Duanne Guess MD FACS Entered By: Duanne Guess on 04/04/2023 10:54:24 -------------------------------------------------------------------------------- HxROS Details Patient Name: Date of Service: [name] [name], [name]. 04/04/2023 9:30 A M Medical Record Number: [id number] Patient Account Number: [id number] Date of Birth/Sex: Treating RN: [birth year] (62 y.o. F) Primary Care Provider: PCP, NO Other  Clinician: Referring Provider: Treating Provider/Extender: Cleophus Molt in Treatment: 3 Information Obtained From Patient Chart [name] [name] ([id number]) [id number] Page 9 of 10 Constitutional Symptoms (General Health) Medical History: Past Medical History Notes: Acute metabolic encephalopathy, malnutrition Hematologic/Lymphatic Medical History: Positive for: Anemia Past Medical History Notes: vitamin D deficiency Respiratory Medical History: Past Medical History Notes: pulmonary embolism Cardiovascular Medical History: Past Medical History Notes: CVA 2001 Integumentary (Skin) Medical History: Past Medical History Notes: cellulitis Musculoskeletal Medical History: Positive for: Gout; Osteomyelitis Past Medical History Notes: osteoporosis, contracted Neurologic Medical History: Past Medical History Notes: CVA, chronic pain Psychiatric Medical History: Past Medical History Notes: Bipolar disorder, anxiety, depression, tobacco abuse, alcohol abuse Immunizations Pneumococcal Vaccine: Received Pneumococcal Vaccination: No Implantable Devices No devices added Hospitalization / Surgery History Type of Hospitalization/Surgery Partial hip arthroplasty (Right) T hip arthroplasty (Left) otal Appendectomy Family and Social History Unknown History: Yes; Current some day smoker; Marital Status - Single; Alcohol Use: Never - Hx: ETOH; Drug Use: No History; Caffeine Use: Rarely; Financial Concerns: No; Food, Clothing or Shelter Needs: No; Support System Lacking: No; Transportation Concerns: No Electronic Signature(s) Signed: 04/04/2023 12:20:15 PM By: Duanne Guess MD FACS Entered By: Duanne Guess on 04/04/2023 10:51:28 Bilyk, Nyazia [name] ([id number]) [id number] Page 10 of 10 -------------------------------------------------------------------------------- SuperBill Details Patient  Name: Date of Service: [facility], [name]. 04/04/2023 Medical Record Number: [id number] Patient Account Number: [id number] Date of Birth/Sex: Treating RN: 1960/07/19 (62 y.o. 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 She does state that she drinks 2-3 protein shakes such as boost or Ensure daily and she is also receiving Pro-Stat at her facility. 01/16/2022: She has not received a level 3 surface and is still on a level 2. She says she is drinking protein shakes; she has a Boost with her today but it only has 10 g of protein. The left medial ankle wound has a little bit of eschar and just a tiny open area. The left lateral ankle wound has hypertrophic granulation tissue. The sacral wound is perhaps a little bit smaller but still has bone frankly exposed. 02/06/2022: We are still working on getting her a level 3 mattress surface. Her pain has decreased and she is tolerating her dressing changes much better. The left medial ankle wound is healed. The left latera ankle wound has a lot of accumulated slough. The sacrum is a little smaller and the tunneling is not as deep. Bone is still exposed. Minimal slough accumulation. READMISSION 03/13/2023 The patient returns after over a years absence. She says that her nursing facility got a wound care nurse and that is what she did not return. She has been hospitalized multiple times since I saw her last for issues related to her wounds. Most recently, she was in the hospital at the beginning and middle of August due to bleeding from her right groin. She has open wounds on her inguinal crease at the top of her right thigh, but due to her severe contractures, these have been left essentially untreated. There is a strong fungal odor coming from her groin area. She has a new ulcer on  her left greater trochanter. It is no deeper than an abrasion and is clean. Her sacral ulcer is nearly healed with just a tiny residual open area. 04/04/2023: The ulcer on her left trochanter has gotten larger and deeper. There is a layer of leathery eschar on the surface. The sacral ulcer is stable. She still has a strong fungal odor coming from her groin area. There is also bleeding coming from the open wound at the very depths of her contracted hip area. She does say that she is not wearing a brief at her facility, but she did wear 1 today, presumably for transport. Electronic Signature(s) Signed: 04/04/2023 10:48:23 AM By: Duanne Guess MD FACS Entered By: Duanne Guess on 04/04/2023 10:48:23 -------------------------------------------------------------------------------- Chemical Cauterization Details Patient Name: Date of Service: [name] [name], [name]. 04/04/2023 9:30 A M Medical Record Number: [id number] Patient Account Number: [id number] Date of Birth/Sex: Treating RN: [birth year] (61 y.o. F) Primary Care Provider: PCP, NO Other Clinician: Referring Provider: Treating Provider/Extender: Cleophus Molt in Treatment: 3 Sizemore, Munira [name] ([id number]) [id number] Page 3 of 10 Procedure Performed for: Wound #4 Right Groin Performed By: Physician Duanne Guess, MD Post Procedure Diagnosis Same as Pre-procedure Electronic Signature(s) Signed: 04/04/2023 10:46:53 AM By: Duanne Guess MD FACS Entered By: Duanne Guess on 04/04/2023 10:46:52 -------------------------------------------------------------------------------- Physical Exam Details Patient Name: Date of Service: [name] [name], [name]. 04/04/2023 9:30 A M Medical Record Number: [id number] Patient Account Number: [id number] Date of Birth/Sex: Treating RN: [birth year] (61 y.o. F) Primary Care Provider: PCP, NO Other Clinician: Referring Provider: Treating Provider/Extender:  Cleophus Molt in Treatment: 3 Constitutional . . . . no acute distress. Respiratory Normal work of breathing on room air. Notes 04/04/2023: The ulcer on her left trochanter has gotten larger and deeper. There is a layer of leathery eschar on the  sacral wound were putrid. She does state that she drinks 2-3 protein shakes such as boost or Ensure daily and she is also receiving Pro-Stat at her facility. 01/16/2022: She has not received a level 3 surface and is still on a level 2. She says she is drinking protein shakes; she has a Boost with her today but it only has 10 g of protein. The left medial ankle wound has a little bit of eschar and just a tiny open area. The left lateral ankle wound has hypertrophic granulation tissue. The sacral wound is perhaps a little bit smaller but still has bone frankly exposed. 02/06/2022: We are still working on getting her a level 3 mattress surface. Her pain has decreased and she is tolerating her dressing changes much better. The left medial ankle wound is healed. The left latera ankle wound has a lot of accumulated slough. The sacrum is a little smaller and the tunneling is not as deep. Bone is still exposed. Minimal slough accumulation. READMISSION 03/13/2023 The patient returns after over a years absence. She says that her nursing facility got a wound care nurse and that is what she did not return. She has been hospitalized multiple times since I saw her last for issues related to her wounds. Most recently, she was in the hospital at the beginning and middle of August due to bleeding from her right groin. She has open wounds on her inguinal crease at the top of her right thigh, but due to her severe contractures, these have been left essentially untreated. There is a strong fungal odor coming from her groin area. She has a new ulcer on  her left greater trochanter. It is no deeper than an abrasion and is clean. Her sacral ulcer is nearly healed with just a tiny residual open area. 04/04/2023: The ulcer on her left trochanter has gotten larger and deeper. There is a layer of leathery eschar on the surface. The sacral ulcer is stable. She still has a strong fungal odor coming from her groin area. There is also bleeding coming from the open wound at the very depths of her contracted hip area. She does say that she is not wearing a brief at her facility, but she did wear 1 today, presumably for transport. Electronic Signature(s) Signed: 04/04/2023 10:48:23 AM By: Duanne Guess MD FACS Entered By: Duanne Guess on 04/04/2023 10:48:23 -------------------------------------------------------------------------------- Chemical Cauterization Details Patient Name: Date of Service: [name] [name], [name]. 04/04/2023 9:30 A M Medical Record Number: [id number] Patient Account Number: [id number] Date of Birth/Sex: Treating RN: [birth year] (61 y.o. F) Primary Care Provider: PCP, NO Other Clinician: Referring Provider: Treating Provider/Extender: Cleophus Molt in Treatment: 3 Sizemore, Munira [name] ([id number]) [id number] Page 3 of 10 Procedure Performed for: Wound #4 Right Groin Performed By: Physician Duanne Guess, MD Post Procedure Diagnosis Same as Pre-procedure Electronic Signature(s) Signed: 04/04/2023 10:46:53 AM By: Duanne Guess MD FACS Entered By: Duanne Guess on 04/04/2023 10:46:52 -------------------------------------------------------------------------------- Physical Exam Details Patient Name: Date of Service: [name] [name], [name]. 04/04/2023 9:30 A M Medical Record Number: [id number] Patient Account Number: [id number] Date of Birth/Sex: Treating RN: [birth year] (61 y.o. F) Primary Care Provider: PCP, NO Other Clinician: Referring Provider: Treating Provider/Extender:  Cleophus Molt in Treatment: 3 Constitutional . . . . no acute distress. Respiratory Normal work of breathing on room air. Notes 04/04/2023: The ulcer on her left trochanter has gotten larger and deeper. There is a layer of leathery eschar on the  Burck, Aemilia [name] ([id number]) [id number] Page 1 of 10 Visit Report for 04/04/2023 Chief Complaint Document Details Patient Name: Date of Service: [facility], [name]. 04/04/2023 9:30 A M Medical Record Number: [id number] Patient Account Number: [id number] Date of Birth/Sex: Treating RN: Nov 10, 1960 (61 y.o. F) Primary Care Provider: PCP, NO Other Clinician: Referring Provider: Treating Provider/Extender: Cleophus Molt in Treatment: 3 Information Obtained from: Patient Chief Complaint Patient is at the clinic for treatment of multiple open pressure ulcers Electronic Signature(s) Signed: 04/04/2023 10:46:59 AM By: Duanne Guess MD FACS Entered By: Duanne Guess on 04/04/2023 10:46:59 -------------------------------------------------------------------------------- Debridement Details Patient Name: Date of Service: [name] [name], [name]. 04/04/2023 9:30 A M Medical Record Number: [id number] Patient Account Number: [id number] Date of Birth/Sex: Treating RN: Sep 14, 1960 (61 y.o. Gevena [name] Primary Care Provider: PCP, NO Other Clinician: Referring Provider: Treating Provider/Extender: Cleophus Molt in Treatment: 3 Debridement Performed for Assessment: Wound #5 Left Trochanter Performed By: Physician Duanne Guess, MD The following information was scribed by: Brenton Grills The information was scribed for: Duanne Guess Debridement Type: Debridement Level of Consciousness (Pre-procedure): Awake and Alert Pre-procedure Verification/Time Out Yes - 10:15 Taken: Start Time: 10:16 Pain Control: Lidocaine 4% Topical Solution Percent of Wound Bed Debrided: 100% T Area Debrided (cm): otal 1.41 Tissue and other material debrided: Non-Viable, Eschar Level: Non-Viable Tissue Debridement Description: Selective/Open Wound Instrument: Curette Bleeding: Minimum Hemostasis Achieved: Pressure Response to  Treatment: Procedure was tolerated well Level of Consciousness (Post- Awake and Alert procedure): Post Debridement Measurements of Total Wound Length: (cm) 2 Width: (cm) 0.9 Depth: (cm) 0.1 Volume: (cm) 0.141 Character of Wound/Ulcer Post Debridement: Improved Post Procedure Diagnosis Bury, Kathleen [name] ([id number]) [id number] Page 2 of 10 Same as Pre-procedure Electronic Signature(s) Signed: 04/04/2023 12:20:15 PM By: Duanne Guess MD FACS Signed: 04/05/2023 4:20:59 PM By: Brenton Grills Entered By: Brenton Grills on 04/04/2023 10:22:07 -------------------------------------------------------------------------------- HPI Details Patient Name: Date of Service: [name] [name], [name]. 04/04/2023 9:30 A M Medical Record Number: [id number] Patient Account Number: [id number] Date of Birth/Sex: Treating RN: [birth year] (61 y.o. F) Primary Care Provider: PCP, NO Other Clinician: Referring Provider: Treating Provider/Extender: Cleophus Molt in Treatment: 3 History of Present Illness HPI Description: ADMISSION 01/02/2022 This is a 62 year old woman with a past medical history significant for a stroke that has left her bedbound and with significant contractures. 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 I am unclear as to how long her sacral pressure ulcer has been present, but there is bone exposed and apparently she was having more drainage from the site. She has also developed a stage III pressure ulcer on her left lateral ankle and a stage II pressure ulcer on her left medial ankle. She is not diabetic, but does have a smoking history and continues to smoke about half a pack a day. ABIs were performed and were within normal limits. Antibiotics were prescribed for a total course of 6 weeks. She has been followed by infectious disease and last saw them on December 20, 2021.  Infectious disease referred her to the wound care center for evaluation and management. The patient resists frequent turning because of chronic pain issues. The aide from her facility states that she is on a low-air-loss mattress. The patient says she has not been permitting dressing changes for the past few days because of her pain. The dressings that were removed from her  Burck, Aemilia [name] ([id number]) [id number] Page 1 of 10 Visit Report for 04/04/2023 Chief Complaint Document Details Patient Name: Date of Service: [facility], [name]. 04/04/2023 9:30 A M Medical Record Number: [id number] Patient Account Number: [id number] Date of Birth/Sex: Treating RN: Nov 10, 1960 (61 y.o. F) Primary Care Provider: PCP, NO Other Clinician: Referring Provider: Treating Provider/Extender: Cleophus Molt in Treatment: 3 Information Obtained from: Patient Chief Complaint Patient is at the clinic for treatment of multiple open pressure ulcers Electronic Signature(s) Signed: 04/04/2023 10:46:59 AM By: Duanne Guess MD FACS Entered By: Duanne Guess on 04/04/2023 10:46:59 -------------------------------------------------------------------------------- Debridement Details Patient Name: Date of Service: [name] [name], [name]. 04/04/2023 9:30 A M Medical Record Number: [id number] Patient Account Number: [id number] Date of Birth/Sex: Treating RN: Sep 14, 1960 (61 y.o. Gevena [name] Primary Care Provider: PCP, NO Other Clinician: Referring Provider: Treating Provider/Extender: Cleophus Molt in Treatment: 3 Debridement Performed for Assessment: Wound #5 Left Trochanter Performed By: Physician Duanne Guess, MD The following information was scribed by: Brenton Grills The information was scribed for: Duanne Guess Debridement Type: Debridement Level of Consciousness (Pre-procedure): Awake and Alert Pre-procedure Verification/Time Out Yes - 10:15 Taken: Start Time: 10:16 Pain Control: Lidocaine 4% Topical Solution Percent of Wound Bed Debrided: 100% T Area Debrided (cm): otal 1.41 Tissue and other material debrided: Non-Viable, Eschar Level: Non-Viable Tissue Debridement Description: Selective/Open Wound Instrument: Curette Bleeding: Minimum Hemostasis Achieved: Pressure Response to  Treatment: Procedure was tolerated well Level of Consciousness (Post- Awake and Alert procedure): Post Debridement Measurements of Total Wound Length: (cm) 2 Width: (cm) 0.9 Depth: (cm) 0.1 Volume: (cm) 0.141 Character of Wound/Ulcer Post Debridement: Improved Post Procedure Diagnosis Bury, Kathleen [name] ([id number]) [id number] Page 2 of 10 Same as Pre-procedure Electronic Signature(s) Signed: 04/04/2023 12:20:15 PM By: Duanne Guess MD FACS Signed: 04/05/2023 4:20:59 PM By: Brenton Grills Entered By: Brenton Grills on 04/04/2023 10:22:07 -------------------------------------------------------------------------------- HPI Details Patient Name: Date of Service: [name] [name], [name]. 04/04/2023 9:30 A M Medical Record Number: [id number] Patient Account Number: [id number] Date of Birth/Sex: Treating RN: [birth year] (61 y.o. F) Primary Care Provider: PCP, NO Other Clinician: Referring Provider: Treating Provider/Extender: Cleophus Molt in Treatment: 3 History of Present Illness HPI Description: ADMISSION 01/02/2022 This is a 62 year old woman with a past medical history significant for a stroke that has left her bedbound and with significant contractures. 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 I am unclear as to how long her sacral pressure ulcer has been present, but there is bone exposed and apparently she was having more drainage from the site. She has also developed a stage III pressure ulcer on her left lateral ankle and a stage II pressure ulcer on her left medial ankle. She is not diabetic, but does have a smoking history and continues to smoke about half a pack a day. ABIs were performed and were within normal limits. Antibiotics were prescribed for a total course of 6 weeks. She has been followed by infectious disease and last saw them on December 20, 2021.  Infectious disease referred her to the wound care center for evaluation and management. The patient resists frequent turning because of chronic pain issues. The aide from her facility states that she is on a low-air-loss mattress. The patient says she has not been permitting dressing changes for the past few days because of her pain. The dressings that were removed from her  Apply to wound bed as instructed Secondary Dressing: Bordered Gauze, 2x3.75 in 1 x Per Day/30 Days Discharge Instructions: Apply over primary dressing as directed. Wound #6 - Sacrum Cleanser: Soap and Water 1 x Per Day/30 Days Discharge Instructions: May shower and wash wound with dial antibacterial soap and water prior to dressing change. Cleanser: Vashe 5.8 (oz) 1 x Per Day/30 Days Discharge Instructions: Cleanse the wound with Vashe prior to applying a clean dressing using gauze sponges, not tissue or cotton balls. Cleanser: Wound Cleanser 1 x Per Day/30 Days Discharge Instructions: Cleanse the wound with wound cleanser prior to applying a clean dressing using gauze sponges, not tissue or cotton balls. Prim Dressing: Maxorb Extra Ag+ Alginate Dressing, 2x2 (in/in) 1 x Per Day/30 Days ary Discharge Instructions: Apply to wound bed as instructed Neisen, Stepahnie [name] ([id number]) [id number] Page 5 of 10 Secondary Dressing: Bordered Gauze, 2x3.75 in 1 x Per Day/30 Days Discharge Instructions: Apply over primary dressing as directed. Electronic Signature(s) Signed: 04/04/2023 12:20:15 PM By: Duanne Guess MD FACS Entered By: Duanne Guess on 04/04/2023 10:52:53 -------------------------------------------------------------------------------- Problem List Details Patient Name: Date of Service: [name] [name], [name]. 04/04/2023 9:30 A M Medical Record Number: [id number] Patient Account Number: [id number] Date of Birth/Sex: Treating RN: 1960/11/02 (61 y.o. 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 04/04/2023 9:30 A M Medical Record Number: [id number] Patient Account Number: [id number] Date of Birth/Sex: Treating RN: March 16, 1961 (61 y.o. F) Primary Care Provider: PCP, NO Other Clinician: Masoner, Keiosha [name] ([id number]) [id number] Page 6 of 10 Referring Provider: Treating Provider/Extender: Cleophus Molt in Treatment: 3 Subjective Chief Complaint Information obtained from Patient Patient is at the clinic for treatment of multiple open pressure ulcers History of Present Illness (HPI) ADMISSION 01/02/2022 This is a 62 year old woman with a past medical history significant for a stroke that has left her bedbound and with significant contractures. 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 I am unclear as to how long her sacral pressure ulcer has been present, but there is bone exposed and apparently she was having more drainage from the site. She has also developed a stage III pressure  ulcer on her left lateral ankle and a stage II pressure ulcer on her left medial ankle. She is not diabetic, but does have a smoking history and continues to smoke about half a pack a day. ABIs were performed and were within normal limits. Antibiotics were prescribed for a total course of 6 weeks. She has been followed by infectious disease and last saw them on December 20, 2021. Infectious disease referred her to the wound care center for evaluation and management. The patient resists frequent turning because of chronic pain issues. The aide from her facility states that she is on a  Apply to wound bed as instructed Secondary Dressing: Bordered Gauze, 2x3.75 in 1 x Per Day/30 Days Discharge Instructions: Apply over primary dressing as directed. Wound #6 - Sacrum Cleanser: Soap and Water 1 x Per Day/30 Days Discharge Instructions: May shower and wash wound with dial antibacterial soap and water prior to dressing change. Cleanser: Vashe 5.8 (oz) 1 x Per Day/30 Days Discharge Instructions: Cleanse the wound with Vashe prior to applying a clean dressing using gauze sponges, not tissue or cotton balls. Cleanser: Wound Cleanser 1 x Per Day/30 Days Discharge Instructions: Cleanse the wound with wound cleanser prior to applying a clean dressing using gauze sponges, not tissue or cotton balls. Prim Dressing: Maxorb Extra Ag+ Alginate Dressing, 2x2 (in/in) 1 x Per Day/30 Days ary Discharge Instructions: Apply to wound bed as instructed Neisen, Stepahnie [name] ([id number]) [id number] Page 5 of 10 Secondary Dressing: Bordered Gauze, 2x3.75 in 1 x Per Day/30 Days Discharge Instructions: Apply over primary dressing as directed. Electronic Signature(s) Signed: 04/04/2023 12:20:15 PM By: Duanne Guess MD FACS Entered By: Duanne Guess on 04/04/2023 10:52:53 -------------------------------------------------------------------------------- Problem List Details Patient Name: Date of Service: [name] [name], [name]. 04/04/2023 9:30 A M Medical Record Number: [id number] Patient Account Number: [id number] Date of Birth/Sex: Treating RN: 1960/11/02 (61 y.o. 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 04/04/2023 9:30 A M Medical Record Number: [id number] Patient Account Number: [id number] Date of Birth/Sex: Treating RN: March 16, 1961 (61 y.o. F) Primary Care Provider: PCP, NO Other Clinician: Masoner, Keiosha [name] ([id number]) [id number] Page 6 of 10 Referring Provider: Treating Provider/Extender: Cleophus Molt in Treatment: 3 Subjective Chief Complaint Information obtained from Patient Patient is at the clinic for treatment of multiple open pressure ulcers History of Present Illness (HPI) ADMISSION 01/02/2022 This is a 62 year old woman with a past medical history significant for a stroke that has left her bedbound and with significant contractures. 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 I am unclear as to how long her sacral pressure ulcer has been present, but there is bone exposed and apparently she was having more drainage from the site. She has also developed a stage III pressure  ulcer on her left lateral ankle and a stage II pressure ulcer on her left medial ankle. She is not diabetic, but does have a smoking history and continues to smoke about half a pack a day. ABIs were performed and were within normal limits. Antibiotics were prescribed for a total course of 6 weeks. She has been followed by infectious disease and last saw them on December 20, 2021. Infectious disease referred her to the wound care center for evaluation and management. The patient resists frequent turning because of chronic pain issues. The aide from her facility states that she is on a  wound bed as instructed Secondary Dressing: Bordered Gauze, 2x3.75 in 1 x Per Day/30 Days Discharge Instructions: Apply over primary dressing as directed. 04/04/2023: The ulcer on her left trochanter has gotten larger and deeper. There is a layer of leathery eschar on the surface. The sacral ulcer is stable. She still has a strong fungal odor coming from her groin area. There is also bleeding coming from the open wound at the very depths of her contracted hip area. I used a curette to debride the leathery eschar off of the left trochanter ulcer. I chemically cauterized the bleeding granulation tissue down in her groin area. The sacral ulcer did not require debridement. Will continue silver alginate on the sacrum. She should continue to avoid wearing a brief. Continue miconazole powder to the inguinal fold areas with silver alginate tucked into the wound area. The trochanter surface remains fairly leathery and the patient was unable to tolerate further debridement so we will use Santyl to try and break up this tissue a bit more to permit healing. Follow-up in 2 weeks. Electronic Signature(s) Signed: 04/04/2023 10:54:24 AM By: Duanne Guess MD FACS Entered By: Duanne Guess on 04/04/2023 10:54:24 -------------------------------------------------------------------------------- HxROS Details Patient Name: Date of Service: [name] [name], [name]. 04/04/2023 9:30 A M Medical Record Number: [id number] Patient Account Number: [id number] Date of Birth/Sex: Treating RN: [birth year] (62 y.o. F) Primary Care Provider: PCP, NO Other  Clinician: Referring Provider: Treating Provider/Extender: Cleophus Molt in Treatment: 3 Information Obtained From Patient Chart [name] [name] ([id number]) [id number] Page 9 of 10 Constitutional Symptoms (General Health) Medical History: Past Medical History Notes: Acute metabolic encephalopathy, malnutrition Hematologic/Lymphatic Medical History: Positive for: Anemia Past Medical History Notes: vitamin D deficiency Respiratory Medical History: Past Medical History Notes: pulmonary embolism Cardiovascular Medical History: Past Medical History Notes: CVA 2001 Integumentary (Skin) Medical History: Past Medical History Notes: cellulitis Musculoskeletal Medical History: Positive for: Gout; Osteomyelitis Past Medical History Notes: osteoporosis, contracted Neurologic Medical History: Past Medical History Notes: CVA, chronic pain Psychiatric Medical History: Past Medical History Notes: Bipolar disorder, anxiety, depression, tobacco abuse, alcohol abuse Immunizations Pneumococcal Vaccine: Received Pneumococcal Vaccination: No Implantable Devices No devices added Hospitalization / Surgery History Type of Hospitalization/Surgery Partial hip arthroplasty (Right) T hip arthroplasty (Left) otal Appendectomy Family and Social History Unknown History: Yes; Current some day smoker; Marital Status - Single; Alcohol Use: Never - Hx: ETOH; Drug Use: No History; Caffeine Use: Rarely; Financial Concerns: No; Food, Clothing or Shelter Needs: No; Support System Lacking: No; Transportation Concerns: No Electronic Signature(s) Signed: 04/04/2023 12:20:15 PM By: Duanne Guess MD FACS Entered By: Duanne Guess on 04/04/2023 10:51:28 Bilyk, Nyazia [name] ([id number]) [id number] Page 10 of 10 -------------------------------------------------------------------------------- SuperBill Details Patient  Name: Date of Service: [facility], [name]. 04/04/2023 Medical Record Number: [id number] Patient Account Number: [id number] Date of Birth/Sex: Treating RN: 1960/07/19 (62 y.o. 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 04/04/2023: The ulcer on her left trochanter has gotten larger and deeper. There is a layer of leathery eschar on the surface. The sacral ulcer is stable. She still has a strong fungal odor coming from her groin area. There is also bleeding coming from the open wound at the very depths of her contracted hip area. I used a curette to debride the leathery eschar off of the left trochanter ulcer. I chemically cauterized the bleeding granulation tissue down in her groin area. The sacral ulcer did not require debridement. Will continue silver alginate on the sacrum. She should continue to avoid wearing a brief. Continue miconazole powder to the inguinal fold areas with silver alginate tucked into the wound area. The trochanter surface remains fairly leathery and the patient was unable to tolerate further debridement so we will use Santyl to try and break up this tissue a bit more to permit healing. Follow-up in 2 weeks. Electronic Signature(s) Signed: 04/04/2023 10:54:24 AM By: Duanne Guess MD FACS Entered By: Duanne Guess on 04/04/2023 10:54:24 -------------------------------------------------------------------------------- HxROS Details Patient Name: Date of Service: [name] [name], [name]. 04/04/2023 9:30 A M Medical Record Number: [id number] Patient Account Number: [id number] Date of Birth/Sex: Treating RN: [birth year] (62 y.o. F) Primary Care Provider: PCP, NO Other  Clinician: Referring Provider: Treating Provider/Extender: Cleophus Molt in Treatment: 3 Information Obtained From Patient Chart [name] [name] ([id number]) [id number] Page 9 of 10 Constitutional Symptoms (General Health) Medical History: Past Medical History Notes: Acute metabolic encephalopathy, malnutrition Hematologic/Lymphatic Medical History: Positive for: Anemia Past Medical History Notes: vitamin D deficiency Respiratory Medical History: Past Medical History Notes: pulmonary embolism Cardiovascular Medical History: Past Medical History Notes: CVA 2001 Integumentary (Skin) Medical History: Past Medical History Notes: cellulitis Musculoskeletal Medical History: Positive for: Gout; Osteomyelitis Past Medical History Notes: osteoporosis, contracted Neurologic Medical History: Past Medical History Notes: CVA, chronic pain Psychiatric Medical History: Past Medical History Notes: Bipolar disorder, anxiety, depression, tobacco abuse, alcohol abuse Immunizations Pneumococcal Vaccine: Received Pneumococcal Vaccination: No Implantable Devices No devices added Hospitalization / Surgery History Type of Hospitalization/Surgery Partial hip arthroplasty (Right) T hip arthroplasty (Left) otal Appendectomy Family and Social History Unknown History: Yes; Current some day smoker; Marital Status - Single; Alcohol Use: Never - Hx: ETOH; Drug Use: No History; Caffeine Use: Rarely; Financial Concerns: No; Food, Clothing or Shelter Needs: No; Support System Lacking: No; Transportation Concerns: No Electronic Signature(s) Signed: 04/04/2023 12:20:15 PM By: Duanne Guess MD FACS Entered By: Duanne Guess on 04/04/2023 10:51:28 Bilyk, Nyazia [name] ([id number]) [id number] Page 10 of 10 -------------------------------------------------------------------------------- SuperBill Details Patient  Name: Date of Service: [facility], [name]. 04/04/2023 Medical Record Number: [id number] Patient Account Number: [id number] Date of Birth/Sex: Treating RN: 1960/07/19 (62 y.o. 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

## 2023-04-05 NOTE — Progress Notes (Signed)
[name] [name], [name] [name] ([id number]) [id number] Page 1 of 12 Visit Report for 04/04/2023 Arrival Information Details Patient Name: Date of Service: [facility], [name]. 04/04/2023 9:30 A M Medical Record Number: [id number] Patient Account Number: [id number] Date of Birth/Sex: Treating RN: 1961-01-25 (61 y.o. Gevena Mart Primary Care Mirely Pangle: PCP, NO Other Clinician: Referring Desiray Orchard: Treating Laterica Matarazzo/Extender: Cleophus Molt in Treatment: 3 Visit Information History Since Last Visit All ordered tests and consults were completed: Yes Patient Arrived: Wheel Chair Added or deleted any medications: No Arrival Time: 09:45 Any new allergies or adverse reactions: No Accompanied By: caregiver Had a fall or experienced change in No Transfer Assistance: Nurse, adult activities of daily living that may affect risk of falls: Signs or symptoms of abuse/neglect since last visito No Hospitalized since last visit: No Implantable device outside of the clinic excluding No cellular tissue based products placed in the center since last visit: Has Dressing in Place as Prescribed: Yes Pain Present Now: Yes Electronic Signature(s) Signed: 04/05/2023 4:20:59 PM By: Brenton Grills Entered By: Brenton Grills on 04/04/2023 09:46:29 -------------------------------------------------------------------------------- Clinic Level of Care Assessment Details Patient Name: Date of Service: [facility], [name]. 04/04/2023 9:30 A M Medical Record Number: [id number] Patient Account Number: [id number] Date of Birth/Sex: Treating RN: 04/13/1961 (61 y.o. Gevena Mart Primary Care Tanmay Halteman: PCP, NO Other Clinician: Referring Kayleah Appleyard: Treating Yadira Hada/Extender: Cleophus Molt in Treatment: 3 Clinic Level of Care Assessment Items TOOL 3 Quantity Score X- 1 0 Use when EandM and Procedure is performed on FOLLOW-UP visit ASSESSMENTS - Nursing  Assessment / Reassessment X- 1 10 Reassessment of Co-morbidities (includes updates in patient status) X- 1 5 Reassessment of Adherence to Treatment Plan ASSESSMENTS - Wound and Skin Assessment / Reassessment []  - Points for Wound Assessment can only be taken for a new wound of unknown or different etiology and a procedure is 0 NOT performed to that wound []  - 0 Simple Wound Assessment / Reassessment - one wound X- 1 5 Complex Wound Assessment / Reassessment - multiple wounds []  - 0 Dermatologic / Skin Assessment (not related to wound area) ASSESSMENTS - Focused Assessment [name] [name], [name] [name] ([id number]) [id number] Page 2 of 12 []  - 0 Circumferential Edema Measurements - multi extremities []  - 0 Nutritional Assessment / Counseling / Intervention []  - 0 Lower Extremity Assessment (monofilament, tuning fork, pulses) []  - 0 Peripheral Arterial Disease Assessment (using hand held doppler) ASSESSMENTS - Ostomy and/or Continence Assessment and Care X- 1 10 Incontinence Assessment and Management []  - 0 Ostomy Care Assessment and Management (repouching, etc.) PROCESS - Coordination of Care []  - Points for Discharge Coordination can only be taken for a new wound of unknown or different etiology and a procedure 0 is NOT performed to that wound []  - 0 Simple Patient / Family Education for ongoing care X- 1 20 Complex (extensive) Patient / Family Education for ongoing care X- 1 10 Staff obtains Chiropractor, Records, T Results / Process Orders est []  - 0 Staff telephones HHA, Nursing Homes / Clarify orders / etc []  - 0 Routine Transfer to another Facility (non-emergent condition) []  - 0 Routine Hospital Admission (non-emergent condition) []  - 0 New Admissions / Manufacturing engineer / Ordering NPWT Apligraf, etc. , []  - 0 Emergency Hospital Admission (emergent condition) []  - 0 Simple Discharge Coordination X- 1 15 Complex (extensive) Discharge  Coordination PROCESS - Special Needs []  - 0 Pediatric / Minor Patient Management []  - 0 Isolation Patient Management []  -  0 Hearing / Language / Visual special needs []  - 0 Assessment of Community assistance (transportation, D/C planning, etc.) []  - 0 Additional assistance / Altered mentation []  - 0 Support Surface(s) Assessment (bed, cushion, seat, etc.) INTERVENTIONS - Wound Cleansing / Measurement []  - Points for Wound Cleaning / Measurement, Wound Dressing, Specimen Collection and Specimen taken to lab can only 0 be taken for a new wound of unknown or different etiology and a procedure is NOT performed to that wound []  - 0 Simple Wound Cleansing - one wound X- 1 5 Complex Wound Cleansing - multiple wounds []  - 0 Wound Imaging (photographs - any number of wounds) []  - 0 Wound Tracing (instead of photographs) []  - 0 Simple Wound Measurement - one wound X- 1 5 Complex Wound Measurement - multiple wounds INTERVENTIONS - Wound Dressings []  - 0 Small Wound Dressing one or multiple wounds X- 3 15 Medium Wound Dressing one or multiple wounds []  - 0 Large Wound Dressing one or multiple wounds INTERVENTIONS - Miscellaneous []  - 0 External ear exam []  - 0 Specimen Collection (cultures, biopsies, blood, body fluids, etc.) []  - 0 Specimen(s) / Culture(s) sent or taken to Lab for analysis []  - 0 Patient Transfer (multiple staff / Teddy Spike / Similar devices) [name] [name], [name] [name] ([id number]) [phone].pdf Page 3 of 12 []  - 0 Simple Staple / Suture removal (25 or less) []  - 0 Complex Staple / Suture removal (26 or more) []  - 0 Hypo / Hyperglycemic Management (close monitor of Blood Glucose) []  - 0 Ankle / Brachial Index (ABI) - do not check if billed separately X- 1 5 Vital Signs Has the patient been seen at the hospital within the last three years: Yes Total Score: 135 Level Of Care: New/Established - Level 4 Electronic Signature(s) Signed:  04/05/2023 4:20:59 PM By: Brenton Grills Entered By: Brenton Grills on 04/04/2023 10:25:31 -------------------------------------------------------------------------------- Encounter Discharge Information Details Patient Name: Date of Service: [name] [name], [name]. 04/04/2023 9:30 A M Medical Record Number: [id number] Patient Account Number: [id number] Date of Birth/Sex: Treating RN: [birth year] (61 y.o. Gevena Mart Primary Care Alfonzia Woolum: PCP, NO Other Clinician: Referring Shontez Sermon: Treating Latresa Gasser/Extender: Cleophus Molt in Treatment: 3 Encounter Discharge Information Items Post Procedure Vitals Discharge Condition: Stable Temperature (F): 98 Ambulatory Status: Wheelchair Pulse (bpm): 72 Discharge Destination: Skilled Nursing Facility Respiratory Rate (breaths/min): 18 Telephoned: No Blood Pressure (mmHg): 132/70 Orders Sent: Yes Transportation: Other Accompanied By: caregiver Schedule Follow-up Appointment: Yes Clinical Summary of Care: Patient Declined Electronic Signature(s) Signed: 04/05/2023 4:20:59 PM By: Brenton Grills Entered By: Brenton Grills on 04/04/2023 10:27:17 -------------------------------------------------------------------------------- Lower Extremity Assessment Details Patient Name: Date of Service: [facility], [name]. 04/04/2023 9:30 A M Medical Record Number: [id number] Patient Account Number: [id number] Date of Birth/Sex: Treating RN: 1961/01/14 (61 y.o. Gevena Mart Primary Care Nylan Nevel: PCP, NO Other Clinician: Referring Teller Wakefield: Treating Kaniyah Lisby/Extender: Cleophus Molt in Treatment: 3 Electronic Signature(s) Signed: 04/05/2023 4:20:59 PM By: Brenton Grills Entered By: Brenton Grills on 04/04/2023 09:47:28 [name] [name], [name] [name] ([id number]) [id number] Page 4 of 12 -------------------------------------------------------------------------------- Multi Wound Chart  Details Patient Name: Date of Service: [facility], [name]. 04/04/2023 9:30 A M Medical Record Number: [id number] Patient Account Number: [id number] Date of Birth/Sex: Treating RN: [birth year] (61 y.o. F) Primary Care Rajan Burgard: PCP, NO Other Clinician: Referring Zacharia Sowles: Treating Christen Bedoya/Extender: Cleophus Molt in Treatment: 3 Vital Signs Height(in): 67 Pulse(bpm): 88 Weight(lbs): 110 Blood Pressure(mmHg): 124/68 Body Mass Index(BMI): 17.2  using gauze sponges, not tissue or cotton balls. Wound Cleanser Discharge Instruction: Cleanse the wound with wound cleanser prior to applying a clean dressing using gauze sponges, not tissue or cotton balls. Peri-Wound Care Topical Santyl Collagenase Ointment, 30 (gm), tube Primary Dressing Maxorb Extra Ag+ Alginate Dressing, 2x2 (in/in) Discharge Instruction: Apply to wound bed as instructed Secondary Dressing Bordered Gauze, 2x3.75 in Discharge Instruction: Apply over primary dressing as directed. Secured With Compression Wrap Compression Stockings Add-Ons Electronic Signature(s) Signed: 04/05/2023 4:20:59 PM By: Brenton Grills Entered By: Brenton Grills on 04/04/2023 09:59:02 Wound Assessment Details -------------------------------------------------------------------------------- [name] [name], [name] [name] ([id number]) [id number] Page 11 of 12 Patient Name: Date of Service: [facility], [name]. 04/04/2023 9:30 A M Medical Record Number: [id number] Patient Account Number: [id number] Date of Birth/Sex: Treating RN: [birth year] (61 y.o. Gevena Mart Primary Care Yaniris Braddock: PCP, NO Other Clinician: Referring Ronit Marczak: Treating Neely Kammerer/Extender: Cleophus Molt in Treatment: 3 Wound Status Wound Number: 6 Primary Etiology: Pressure Ulcer Wound Location: Sacrum Wound Status: Open Wounding Event: Pressure Injury Comorbid  History: Anemia, Gout, Osteomyelitis Date Acquired: 12/21/2021 Weeks Of Treatment: 3 Clustered Wound: No Photos Wound Measurements Length: (cm) 0.2 % Reduction in Area: 96.4% Width: (cm) 0.2 % Reduction in Volume: 97.7% Depth: (cm) 0.2 Epithelialization: Small (1-33%) Area: (cm) 0.031 Tunneling: No Volume: (cm) 0.006 Undermining: No Wound Description Classification: Category/Stage II Foul Odor After Cleansing: No Exudate Amount: Medium Slough/Fibrino Yes Exudate Type: Serosanguineous Exudate Color: red, brown Wound Bed Granulation Amount: Large (67-100%) Exposed Structure Granulation Quality: Red Fat Layer (Subcutaneous Tissue) Exposed: Yes Necrotic Amount: Small (1-33%) Necrotic Quality: Adherent Slough Periwound Skin Texture Texture Color No Abnormalities Noted: Yes No Abnormalities Noted: No Moisture Temperature / Pain No Abnormalities Noted: Yes Temperature: No Abnormality Treatment Notes Wound #6 (Sacrum) Cleanser Soap and Water Discharge Instruction: May shower and wash wound with dial antibacterial soap and water prior to dressing change. Vashe 5.8 (oz) Discharge Instruction: Cleanse the wound with Vashe prior to applying a clean dressing using gauze sponges, not tissue or cotton balls. Wound Cleanser Discharge Instruction: Cleanse the wound with wound cleanser prior to applying a clean dressing using gauze sponges, not tissue or cotton balls. Peri-Wound Care Topical Primary Dressing Maxorb Extra Ag+ Alginate Dressing, 2x2 (in/in) Discharge Instruction: Apply to wound bed as instructed [name] [name], [name] [name] ([id number]) [id number] Page 12 of 12 Secondary Dressing Bordered Gauze, 2x3.75 in Discharge Instruction: Apply over primary dressing as directed. Secured With Compression Wrap Compression Stockings Facilities manager) Signed: 04/05/2023 4:20:59 PM By: Brenton Grills Entered By: Brenton Grills on 04/04/2023  09:59:40 -------------------------------------------------------------------------------- Vitals Details Patient Name: Date of Service: [name] [name], [name]. 04/04/2023 9:30 A M Medical Record Number: [id number] Patient Account Number: [id number] Date of Birth/Sex: Treating RN: 06-May-1961 (61 y.o. Gevena Mart Primary Care Damyia Strider: PCP, NO Other Clinician: Referring Cleatis Fandrich: Treating Kingstin Heims/Extender: Cleophus Molt in Treatment: 3 Vital Signs Time Taken: 09:47 Temperature (F): 97.8 Height (in): 67 Pulse (bpm): 88 Weight (lbs): 110 Respiratory Rate (breaths/min): 16 Body Mass Index (BMI): 17.2 Blood Pressure (mmHg): 124/68 Reference Range: 80 - 120 mg / dl Electronic Signature(s) Signed: 04/05/2023 4:20:59 PM By: Brenton Grills Entered By: Brenton Grills on 04/04/2023 09:47:05  using gauze sponges, not tissue or cotton balls. Wound Cleanser Discharge Instruction: Cleanse the wound with wound cleanser prior to applying a clean dressing using gauze sponges, not tissue or cotton balls. Peri-Wound Care Topical Santyl Collagenase Ointment, 30 (gm), tube Primary Dressing Maxorb Extra Ag+ Alginate Dressing, 2x2 (in/in) Discharge Instruction: Apply to wound bed as instructed Secondary Dressing Bordered Gauze, 2x3.75 in Discharge Instruction: Apply over primary dressing as directed. Secured With Compression Wrap Compression Stockings Add-Ons Electronic Signature(s) Signed: 04/05/2023 4:20:59 PM By: Brenton Grills Entered By: Brenton Grills on 04/04/2023 09:59:02 Wound Assessment Details -------------------------------------------------------------------------------- [name] [name], [name] [name] ([id number]) [id number] Page 11 of 12 Patient Name: Date of Service: [facility], [name]. 04/04/2023 9:30 A M Medical Record Number: [id number] Patient Account Number: [id number] Date of Birth/Sex: Treating RN: [birth year] (61 y.o. Gevena Mart Primary Care Yaniris Braddock: PCP, NO Other Clinician: Referring Ronit Marczak: Treating Neely Kammerer/Extender: Cleophus Molt in Treatment: 3 Wound Status Wound Number: 6 Primary Etiology: Pressure Ulcer Wound Location: Sacrum Wound Status: Open Wounding Event: Pressure Injury Comorbid  History: Anemia, Gout, Osteomyelitis Date Acquired: 12/21/2021 Weeks Of Treatment: 3 Clustered Wound: No Photos Wound Measurements Length: (cm) 0.2 % Reduction in Area: 96.4% Width: (cm) 0.2 % Reduction in Volume: 97.7% Depth: (cm) 0.2 Epithelialization: Small (1-33%) Area: (cm) 0.031 Tunneling: No Volume: (cm) 0.006 Undermining: No Wound Description Classification: Category/Stage II Foul Odor After Cleansing: No Exudate Amount: Medium Slough/Fibrino Yes Exudate Type: Serosanguineous Exudate Color: red, brown Wound Bed Granulation Amount: Large (67-100%) Exposed Structure Granulation Quality: Red Fat Layer (Subcutaneous Tissue) Exposed: Yes Necrotic Amount: Small (1-33%) Necrotic Quality: Adherent Slough Periwound Skin Texture Texture Color No Abnormalities Noted: Yes No Abnormalities Noted: No Moisture Temperature / Pain No Abnormalities Noted: Yes Temperature: No Abnormality Treatment Notes Wound #6 (Sacrum) Cleanser Soap and Water Discharge Instruction: May shower and wash wound with dial antibacterial soap and water prior to dressing change. Vashe 5.8 (oz) Discharge Instruction: Cleanse the wound with Vashe prior to applying a clean dressing using gauze sponges, not tissue or cotton balls. Wound Cleanser Discharge Instruction: Cleanse the wound with wound cleanser prior to applying a clean dressing using gauze sponges, not tissue or cotton balls. Peri-Wound Care Topical Primary Dressing Maxorb Extra Ag+ Alginate Dressing, 2x2 (in/in) Discharge Instruction: Apply to wound bed as instructed [name] [name], [name] [name] ([id number]) [id number] Page 12 of 12 Secondary Dressing Bordered Gauze, 2x3.75 in Discharge Instruction: Apply over primary dressing as directed. Secured With Compression Wrap Compression Stockings Facilities manager) Signed: 04/05/2023 4:20:59 PM By: Brenton Grills Entered By: Brenton Grills on 04/04/2023  09:59:40 -------------------------------------------------------------------------------- Vitals Details Patient Name: Date of Service: [name] [name], [name]. 04/04/2023 9:30 A M Medical Record Number: [id number] Patient Account Number: [id number] Date of Birth/Sex: Treating RN: 06-May-1961 (61 y.o. Gevena Mart Primary Care Damyia Strider: PCP, NO Other Clinician: Referring Cleatis Fandrich: Treating Kingstin Heims/Extender: Cleophus Molt in Treatment: 3 Vital Signs Time Taken: 09:47 Temperature (F): 97.8 Height (in): 67 Pulse (bpm): 88 Weight (lbs): 110 Respiratory Rate (breaths/min): 16 Body Mass Index (BMI): 17.2 Blood Pressure (mmHg): 124/68 Reference Range: 80 - 120 mg / dl Electronic Signature(s) Signed: 04/05/2023 4:20:59 PM By: Brenton Grills Entered By: Brenton Grills on 04/04/2023 09:47:05  [name] [name], [name] [name] ([id number]) [id number] Page 1 of 12 Visit Report for 04/04/2023 Arrival Information Details Patient Name: Date of Service: [facility], [name]. 04/04/2023 9:30 A M Medical Record Number: [id number] Patient Account Number: [id number] Date of Birth/Sex: Treating RN: 1961-01-25 (61 y.o. Gevena Mart Primary Care Mirely Pangle: PCP, NO Other Clinician: Referring Desiray Orchard: Treating Laterica Matarazzo/Extender: Cleophus Molt in Treatment: 3 Visit Information History Since Last Visit All ordered tests and consults were completed: Yes Patient Arrived: Wheel Chair Added or deleted any medications: No Arrival Time: 09:45 Any new allergies or adverse reactions: No Accompanied By: caregiver Had a fall or experienced change in No Transfer Assistance: Nurse, adult activities of daily living that may affect risk of falls: Signs or symptoms of abuse/neglect since last visito No Hospitalized since last visit: No Implantable device outside of the clinic excluding No cellular tissue based products placed in the center since last visit: Has Dressing in Place as Prescribed: Yes Pain Present Now: Yes Electronic Signature(s) Signed: 04/05/2023 4:20:59 PM By: Brenton Grills Entered By: Brenton Grills on 04/04/2023 09:46:29 -------------------------------------------------------------------------------- Clinic Level of Care Assessment Details Patient Name: Date of Service: [facility], [name]. 04/04/2023 9:30 A M Medical Record Number: [id number] Patient Account Number: [id number] Date of Birth/Sex: Treating RN: 04/13/1961 (61 y.o. Gevena Mart Primary Care Tanmay Halteman: PCP, NO Other Clinician: Referring Kayleah Appleyard: Treating Yadira Hada/Extender: Cleophus Molt in Treatment: 3 Clinic Level of Care Assessment Items TOOL 3 Quantity Score X- 1 0 Use when EandM and Procedure is performed on FOLLOW-UP visit ASSESSMENTS - Nursing  Assessment / Reassessment X- 1 10 Reassessment of Co-morbidities (includes updates in patient status) X- 1 5 Reassessment of Adherence to Treatment Plan ASSESSMENTS - Wound and Skin Assessment / Reassessment []  - Points for Wound Assessment can only be taken for a new wound of unknown or different etiology and a procedure is 0 NOT performed to that wound []  - 0 Simple Wound Assessment / Reassessment - one wound X- 1 5 Complex Wound Assessment / Reassessment - multiple wounds []  - 0 Dermatologic / Skin Assessment (not related to wound area) ASSESSMENTS - Focused Assessment [name] [name], [name] [name] ([id number]) [id number] Page 2 of 12 []  - 0 Circumferential Edema Measurements - multi extremities []  - 0 Nutritional Assessment / Counseling / Intervention []  - 0 Lower Extremity Assessment (monofilament, tuning fork, pulses) []  - 0 Peripheral Arterial Disease Assessment (using hand held doppler) ASSESSMENTS - Ostomy and/or Continence Assessment and Care X- 1 10 Incontinence Assessment and Management []  - 0 Ostomy Care Assessment and Management (repouching, etc.) PROCESS - Coordination of Care []  - Points for Discharge Coordination can only be taken for a new wound of unknown or different etiology and a procedure 0 is NOT performed to that wound []  - 0 Simple Patient / Family Education for ongoing care X- 1 20 Complex (extensive) Patient / Family Education for ongoing care X- 1 10 Staff obtains Chiropractor, Records, T Results / Process Orders est []  - 0 Staff telephones HHA, Nursing Homes / Clarify orders / etc []  - 0 Routine Transfer to another Facility (non-emergent condition) []  - 0 Routine Hospital Admission (non-emergent condition) []  - 0 New Admissions / Manufacturing engineer / Ordering NPWT Apligraf, etc. , []  - 0 Emergency Hospital Admission (emergent condition) []  - 0 Simple Discharge Coordination X- 1 15 Complex (extensive) Discharge  Coordination PROCESS - Special Needs []  - 0 Pediatric / Minor Patient Management []  - 0 Isolation Patient Management []  -  using gauze sponges, not tissue or cotton balls. Wound Cleanser Discharge Instruction: Cleanse the wound with wound cleanser prior to applying a clean dressing using gauze sponges, not tissue or cotton balls. Peri-Wound Care Topical Santyl Collagenase Ointment, 30 (gm), tube Primary Dressing Maxorb Extra Ag+ Alginate Dressing, 2x2 (in/in) Discharge Instruction: Apply to wound bed as instructed Secondary Dressing Bordered Gauze, 2x3.75 in Discharge Instruction: Apply over primary dressing as directed. Secured With Compression Wrap Compression Stockings Add-Ons Electronic Signature(s) Signed: 04/05/2023 4:20:59 PM By: Brenton Grills Entered By: Brenton Grills on 04/04/2023 09:59:02 Wound Assessment Details -------------------------------------------------------------------------------- [name] [name], [name] [name] ([id number]) [id number] Page 11 of 12 Patient Name: Date of Service: [facility], [name]. 04/04/2023 9:30 A M Medical Record Number: [id number] Patient Account Number: [id number] Date of Birth/Sex: Treating RN: [birth year] (61 y.o. Gevena Mart Primary Care Yaniris Braddock: PCP, NO Other Clinician: Referring Ronit Marczak: Treating Neely Kammerer/Extender: Cleophus Molt in Treatment: 3 Wound Status Wound Number: 6 Primary Etiology: Pressure Ulcer Wound Location: Sacrum Wound Status: Open Wounding Event: Pressure Injury Comorbid  History: Anemia, Gout, Osteomyelitis Date Acquired: 12/21/2021 Weeks Of Treatment: 3 Clustered Wound: No Photos Wound Measurements Length: (cm) 0.2 % Reduction in Area: 96.4% Width: (cm) 0.2 % Reduction in Volume: 97.7% Depth: (cm) 0.2 Epithelialization: Small (1-33%) Area: (cm) 0.031 Tunneling: No Volume: (cm) 0.006 Undermining: No Wound Description Classification: Category/Stage II Foul Odor After Cleansing: No Exudate Amount: Medium Slough/Fibrino Yes Exudate Type: Serosanguineous Exudate Color: red, brown Wound Bed Granulation Amount: Large (67-100%) Exposed Structure Granulation Quality: Red Fat Layer (Subcutaneous Tissue) Exposed: Yes Necrotic Amount: Small (1-33%) Necrotic Quality: Adherent Slough Periwound Skin Texture Texture Color No Abnormalities Noted: Yes No Abnormalities Noted: No Moisture Temperature / Pain No Abnormalities Noted: Yes Temperature: No Abnormality Treatment Notes Wound #6 (Sacrum) Cleanser Soap and Water Discharge Instruction: May shower and wash wound with dial antibacterial soap and water prior to dressing change. Vashe 5.8 (oz) Discharge Instruction: Cleanse the wound with Vashe prior to applying a clean dressing using gauze sponges, not tissue or cotton balls. Wound Cleanser Discharge Instruction: Cleanse the wound with wound cleanser prior to applying a clean dressing using gauze sponges, not tissue or cotton balls. Peri-Wound Care Topical Primary Dressing Maxorb Extra Ag+ Alginate Dressing, 2x2 (in/in) Discharge Instruction: Apply to wound bed as instructed [name] [name], [name] [name] ([id number]) [id number] Page 12 of 12 Secondary Dressing Bordered Gauze, 2x3.75 in Discharge Instruction: Apply over primary dressing as directed. Secured With Compression Wrap Compression Stockings Facilities manager) Signed: 04/05/2023 4:20:59 PM By: Brenton Grills Entered By: Brenton Grills on 04/04/2023  09:59:40 -------------------------------------------------------------------------------- Vitals Details Patient Name: Date of Service: [name] [name], [name]. 04/04/2023 9:30 A M Medical Record Number: [id number] Patient Account Number: [id number] Date of Birth/Sex: Treating RN: 06-May-1961 (61 y.o. Gevena Mart [facility] Damyia Strider: PCP, NO Other Clinician: Referring Cleatis Fandrich: Treating Kingstin Heims/Extender: Cleophus Molt in Treatment: 3 Vital Signs Time Taken: 09:47 Temperature (F): 97.8 Height (in): 67 Pulse (bpm): 88 Weight (lbs): 110 Respiratory Rate (breaths/min): 16 Body Mass Index (BMI): 17.2 Blood Pressure (mmHg): 124/68 Reference Range: 80 - 120 mg / dl Electronic Signature(s) Signed: 04/05/2023 4:20:59 PM By: Brenton Grills Entered By: Brenton Grills on 04/04/2023 09:47:05

## 2023-04-20 ENCOUNTER — Encounter (HOSPITAL_BASED_OUTPATIENT_CLINIC_OR_DEPARTMENT_OTHER): Payer: Medicaid Other | Admitting: General Surgery

## 2023-04-20 DIAGNOSIS — L89154 Pressure ulcer of sacral region, stage 4: Secondary | ICD-10-CM | POA: Diagnosis not present

## 2023-04-20 NOTE — Progress Notes (Signed)
Abnormalities Noted: Yes Treatment Notes Wound #5 (Trochanter) Wound Laterality: Left Cleanser Soap and Water Discharge Instruction: May shower and wash wound with dial antibacterial soap and water prior to dressing change. Vashe 5.8 (oz) Discharge Instruction: Cleanse the wound with Vashe prior to applying a clean dressing using gauze sponges, not tissue or cotton balls. Wound Cleanser Discharge Instruction: Cleanse the wound with wound cleanser prior to applying a clean dressing using gauze sponges, not tissue or cotton balls. Peri-Wound Care Topical Santyl Collagenase Ointment, 30 (gm), tube Primary Dressing Maxorb Extra Ag+ Alginate Dressing, 2x2 (in/in) Discharge Instruction: Apply to wound bed as instructed Secondary  Dressing Bordered Gauze, 2x3.75 in Discharge Instruction: Apply over primary dressing as directed. Secured With Compression Wrap Compression Stockings Add-Ons Electronic Signature(s) Signed: 04/20/2023 11:28:18 AM By: Dayton Scrape Signed: 04/20/2023 2:33:15 PM By: Karie Schwalbe RN Entered By: Dayton Scrape on 04/20/2023 06:52:08 -------------------------------------------------------------------------------- Wound Assessment Details Patient Name: Date of Service: [name] [name], [name] 04/20/2023 9:15 A M Medical Record Number: [id number] Patient Account Number: [id number] Wigger, Rim L ([id number]) [phone].pdf Page 10 of 11 Date of Birth/Sex: Treating RN: 07/15/1960 (62 y.o. [name] Primary Care Clarabelle Oscarson: Other Clinician: PCP, NO Referring Alana Dayton: Treating Lindsi Bayliss/Extender: Cleophus Molt in Treatment: 5 Wound Status Wound Number: 6 Primary Etiology: Pressure Ulcer Wound Location: Sacrum Wound Status: Open Wounding Event: Pressure Injury Comorbid History: Anemia, Gout, Osteomyelitis Date Acquired: 12/21/2021 Weeks Of Treatment: 5 Clustered Wound: No Photos Wound Measurements Length: (cm) 0.6 Width: (cm) 0.7 Depth: (cm) 0.4 Area: (cm) 0.33 Volume: (cm) 0.132 % Reduction in Area: 61.8% % Reduction in Volume: 49% Epithelialization: Small (1-33%) Tunneling: No Undermining: No Wound Description Classification: Category/Stage II Wound Margin: Distinct, outline attached Exudate Amount: Medium Exudate Type: Serosanguineous Exudate Color: red, brown Foul Odor After Cleansing: No Slough/Fibrino Yes Wound Bed Granulation Amount: Large (67-100%) Exposed Structure Granulation Quality: Red Fat Layer (Subcutaneous Tissue) Exposed: Yes Necrotic Amount: Small (1-33%) Necrotic Quality: Adherent Slough Periwound Skin Texture Texture Color No Abnormalities Noted: Yes No Abnormalities Noted: No Moisture Temperature  / Pain No Abnormalities Noted: Yes Temperature: No Abnormality Treatment Notes Wound #6 (Sacrum) Cleanser Soap and Water Discharge Instruction: May shower and wash wound with dial antibacterial soap and water prior to dressing change. Vashe 5.8 (oz) Discharge Instruction: Cleanse the wound with Vashe prior to applying a clean dressing using gauze sponges, not tissue or cotton balls. Wound Cleanser Discharge Instruction: Cleanse the wound with wound cleanser prior to applying a clean dressing using gauze sponges, not tissue or cotton balls. Peri-Wound Care Topical Primary Dressing Maxorb Extra Ag+ Alginate Dressing, 2x2 (in/in) Discharge Instruction: Apply to wound bed as instructed Lanting, Shelbey L ([id number]) [id number] Page 11 of 11 Secondary Dressing Bordered Gauze, 2x3.75 in Discharge Instruction: Apply over primary dressing as directed. Secured With Compression Wrap Compression Stockings Add-Ons Electronic Signature(s) Signed: 04/20/2023 11:28:18 AM By: Dayton Scrape Signed: 04/20/2023 2:33:15 PM By: Karie Schwalbe RN Entered By: Dayton Scrape on 04/20/2023 06:52:56 -------------------------------------------------------------------------------- Vitals Details Patient Name: Date of Service: [name] [name], [name] 04/20/2023 9:15 A M Medical Record Number: [id number] Patient Account Number: [id number] Date of Birth/Sex: Treating RN: 03/29/1961 (62 y.o. F) Primary Care Shown Dissinger: PCP, NO Other Clinician: Referring Takashi Korol: Treating Arye Weyenberg/Extender: Cleophus Molt in Treatment: 5 Vital Signs Time Taken: 09:23 Temperature (F): 98.3 Height (in): 67 Pulse (bpm): 112 Weight (lbs): 110 Respiratory Rate (breaths/min): 18 Body Mass Index (BMI): 17.2 Blood Pressure (mmHg): 124/85 Reference Range: 80 - 120 mg /  clean dressing using gauze sponges, not tissue or cotton balls. Wound Cleanser Discharge Instruction: Cleanse the wound with wound cleanser prior to applying a clean dressing using gauze sponges, not tissue or cotton balls. Peri-Wound Care Topical Primary Dressing Maxorb Extra Ag+ Alginate Dressing, 2x2 (in/in) Discharge Instruction: Apply to wound bed as instructed Secondary Dressing Bordered Gauze, 2x3.75 in Discharge Instruction: Apply over primary dressing as directed. Secured With Compression Wrap Compression Stockings Facilities manager) Signed: 04/20/2023  10:42:41 AM By: Duanne Guess MD FACS Signed: 04/20/2023 12:18:37 PM By: Zenaida Deed RN, BSN Entered By: Duanne Guess on 04/20/2023 07:42:40 -------------------------------------------------------------------------------- Multi-Disciplinary Care Plan Details Patient Name: Date of Service: [facility], [name] 04/20/2023 9:15 A M Medical Record Number: [id number] Patient Account Number: [id number] Date of Birth/Sex: Treating RN: August 16, 1960 (62 y.o. Tommye Standard Primary Care Laurrie Toppin: PCP, NO Other Clinician: Dubiel, Elexia L ([id number]) [id number] Page 5 of 11 Referring Kylene Zamarron: Treating Davinia Riccardi/Extender: Cleophus Molt in Treatment: 5 Multidisciplinary Care Plan reviewed with physician Active Inactive Pressure Nursing Diagnoses: Knowledge deficit related to causes and risk factors for pressure ulcer development Knowledge deficit related to management of pressures ulcers Potential for impaired tissue integrity related to pressure, friction, moisture, and shear Goals: Patient/caregiver will verbalize understanding of pressure ulcer management Date Initiated: 04/20/2023 Target Resolution Date: 05/18/2023 Goal Status: Active Interventions: Assess: immobility, friction, shearing, incontinence upon admission and as needed Assess offloading mechanisms upon admission and as needed Assess potential for pressure ulcer upon admission and as needed Notes: Wound/Skin Impairment Nursing Diagnoses: Impaired tissue integrity Goals: Patient/caregiver will verbalize understanding of skin care regimen Date Initiated: 03/13/2023 Target Resolution Date: 06/26/2023 Goal Status: Active Interventions: Assess ulceration(s) every visit Treatment Activities: Skin care regimen initiated : 03/13/2023 Notes: Electronic Signature(s) Signed: 04/20/2023 12:18:37 PM By: Zenaida Deed RN, BSN Entered By: Zenaida Deed on 04/20/2023  06:22:57 -------------------------------------------------------------------------------- Pain Assessment Details Patient Name: Date of Service: [name] [name], [name] 04/20/2023 9:15 A M Medical Record Number: [id number] Patient Account Number: [id number] Date of Birth/Sex: Treating RN: 11/26/1960 (61 y.o. Tommye Standard Primary Care Lachrista Heslin: PCP, NO Other Clinician: Referring Abisola Carrero: Treating Summit Borchardt/Extender: Cleophus Molt in Treatment: 5 Active Problems Location of Pain Severity and Description of Pain Patient Has Paino No Site Locations Boylan, Kariann L ([id number]) [phone].pdf Page 6 of 11 Pain Management and Medication Current Pain Management: Electronic Signature(s) Signed: 04/20/2023 11:28:18 AM By: Dayton Scrape Signed: 04/20/2023 12:18:37 PM By: Zenaida Deed RN, BSN Entered By: Dayton Scrape on 04/20/2023 06:23:29 -------------------------------------------------------------------------------- Patient/Caregiver Education Details Patient Name: Date of Service: [name] [name], [name] 10/25/2024andnbsp9:15 A M Medical Record Number: [id number] Patient Account Number: [id number] Date of Birth/Gender: Treating RN: 10/18/1960 (61 y.o. Tommye Standard Primary Care Physician: PCP, NO Other Clinician: Referring Physician: Treating Physician/Extender: Cleophus Molt in Treatment: 5 Education Assessment Education Provided To: Patient Education Topics Provided Pressure: Methods: Explain/Verbal Responses: Reinforcements needed, State content correctly Wound/Skin Impairment: Methods: Explain/Verbal Responses: Reinforcements needed, State content correctly Electronic Signature(s) Signed: 04/20/2023 12:18:37 PM By: Zenaida Deed RN, BSN Entered By: Zenaida Deed on 04/20/2023 22:02:54 Wound Assessment  Details -------------------------------------------------------------------------------- Vangilder, Brooks L ([id number]) [id number] Page 7 of 11 Patient Name: Date of Service: [facility], [name] 04/20/2023 9:15 A M Medical Record Number: [id number] Patient Account Number: [id number] Date of Birth/Sex: Treating RN: 03-25-1961 (61 y.o. [name] Primary Care Girolamo Lortie: PCP, NO Other Clinician: Referring Tessia Kassin: Treating Uriel Dowding/Extender: Cleophus Molt in Treatment: 5  Koehne, Elie L ([id number]) [id number] Page 1 of 11 Visit Report for 04/20/2023 Arrival Information Details Patient Name: Date of Service: Mayer, A [place] L. 04/20/2023 9:15 A M Medical Record Number: [id number] Patient Account Number: [id number] Date of Birth/Sex: Treating RN: 02-Apr-1961 (61 y.o. F) Primary Care Marvette Schamp: PCP, NO Other Clinician: Referring Nancyjo Givhan: Treating Verdean Murin/Extender: Cleophus Molt in Treatment: 5 Visit Information History Since Last Visit Added or deleted any medications: No Patient Arrived: Wheel Chair Any new allergies or adverse reactions: No Arrival Time: 09:22 Had a fall or experienced change in No Accompanied By: aide activities of daily living that may affect Transfer Assistance: None risk of falls: Patient Identification Verified: Yes Signs or symptoms of abuse/neglect since last visito No Secondary Verification Process Completed: Yes Hospitalized since last visit: No Implantable device outside of the clinic excluding No cellular tissue based products placed in the center since last visit: Has Dressing in Place as Prescribed: Yes Pain Present Now: No Electronic Signature(s) Signed: 04/20/2023 12:18:37 PM By: Zenaida Deed RN, BSN Entered By: Zenaida Deed on 04/20/2023 07:11:22 -------------------------------------------------------------------------------- Encounter Discharge Information Details Patient Name: Date of Service: [facility], [name] 04/20/2023 9:15 A M Medical Record Number: [id number] Patient Account Number: [id number] Date of Birth/Sex: Treating RN: [birth year] (61 y.o. Fredderick Phenix Primary Care Charitie Hinote: PCP, NO Other Clinician: Referring Teiara Baria: Treating Robbie Rideaux/Extender: Cleophus Molt in Treatment: 5 Encounter Discharge Information Items Post Procedure Vitals Discharge Condition: Stable Temperature (F): 98.3 Ambulatory  Status: Wheelchair Pulse (bpm): 112 Discharge Destination: Skilled Nursing Facility Respiratory Rate (breaths/min): 18 Telephoned: No Blood Pressure (mmHg): 124/85 Orders Sent: Yes Transportation: Private Auto Accompanied By: caregiver Schedule Follow-up Appointment: Yes Clinical Summary of Care: Patient Declined Electronic Signature(s) Signed: 04/20/2023 11:55:59 AM By: Samuella Bruin Entered By: Samuella Bruin on 04/20/2023 07:37:05 Happ, Buffey L ([id number]) [id number] Page 2 of 11 -------------------------------------------------------------------------------- Lower Extremity Assessment Details Patient Name: Date of Service: [name] [name], [name] 04/20/2023 9:15 A M Medical Record Number: [id number] Patient Account Number: [id number] Date of Birth/Sex: Treating RN: Jun 27, 1960 (61 y.o. [name] Primary Care Mykah Shin: PCP, NO Other Clinician: Referring Larell Baney: Treating Moksha Dorgan/Extender: Cleophus Molt in Treatment: 5 Edema Assessment Assessed: Kyra Searles: Yes] Franne Forts: Yes] [Left: Edema] [Right: :] Electronic Signature(s) Signed: 04/20/2023 2:33:15 PM By: Karie Schwalbe RN Entered By: Karie Schwalbe on 04/20/2023 06:34:41 -------------------------------------------------------------------------------- Multi Wound Chart Details Patient Name: Date of Service: [name] [name], [name] 04/20/2023 9:15 A M Medical Record Number: [id number] Patient Account Number: [id number] Date of Birth/Sex: Treating RN: 1960/08/24 (61 y.o. Tommye Standard Primary Care Katera Rybka: PCP, NO Other Clinician: Referring Rhen Kawecki: Treating Eladia Frame/Extender: Cleophus Molt in Treatment: 5 Vital Signs Height(in): 67 Pulse(bpm): 112 Weight(lbs): 110 Blood Pressure(mmHg): 124/85 Body Mass Index(BMI): 17.2 Temperature(F): 98.3 Respiratory Rate(breaths/min): 18 [4:Photos:] Right Groin Left Trochanter  Sacrum Wound Location: Skin Tear/Laceration Trauma Pressure Injury Wounding Event: MASD Abrasion Pressure Ulcer Primary Etiology: Anemia, Gout, Osteomyelitis Anemia, Gout, Osteomyelitis Anemia, Gout, Osteomyelitis Comorbid History: 01/09/2023 03/08/2023 12/21/2021 Date Acquired: 5 5 5  Weeks of Treatment: Open Open Open Wound Status: No No No Wound Recurrence: Yes No No Clustered Wound: 3.8x1.8x0.6 2.1x1x0.4 0.6x0.7x0.4 Measurements L x W x D (cm) 5.372 1.649 0.33 A (cm) : rea 3.223 0.66 0.132 Volume (cm) : 67.10% -199.80% 61.80% % Reduction in A rea: 83.60% -1100.00% 49.00% % Reduction in Volume: Full Thickness Without Exposed Full Thickness Without Exposed Category/Stage II Classification: Support Structures Support Structures Large Medium Medium  clean dressing using gauze sponges, not tissue or cotton balls. Wound Cleanser Discharge Instruction: Cleanse the wound with wound cleanser prior to applying a clean dressing using gauze sponges, not tissue or cotton balls. Peri-Wound Care Topical Primary Dressing Maxorb Extra Ag+ Alginate Dressing, 2x2 (in/in) Discharge Instruction: Apply to wound bed as instructed Secondary Dressing Bordered Gauze, 2x3.75 in Discharge Instruction: Apply over primary dressing as directed. Secured With Compression Wrap Compression Stockings Facilities manager) Signed: 04/20/2023  10:42:41 AM By: Duanne Guess MD FACS Signed: 04/20/2023 12:18:37 PM By: Zenaida Deed RN, BSN Entered By: Duanne Guess on 04/20/2023 07:42:40 -------------------------------------------------------------------------------- Multi-Disciplinary Care Plan Details Patient Name: Date of Service: [facility], [name] 04/20/2023 9:15 A M Medical Record Number: [id number] Patient Account Number: [id number] Date of Birth/Sex: Treating RN: August 16, 1960 (62 y.o. Tommye Standard Primary Care Laurrie Toppin: PCP, NO Other Clinician: Dubiel, Elexia L ([id number]) [id number] Page 5 of 11 Referring Kylene Zamarron: Treating Davinia Riccardi/Extender: Cleophus Molt in Treatment: 5 Multidisciplinary Care Plan reviewed with physician Active Inactive Pressure Nursing Diagnoses: Knowledge deficit related to causes and risk factors for pressure ulcer development Knowledge deficit related to management of pressures ulcers Potential for impaired tissue integrity related to pressure, friction, moisture, and shear Goals: Patient/caregiver will verbalize understanding of pressure ulcer management Date Initiated: 04/20/2023 Target Resolution Date: 05/18/2023 Goal Status: Active Interventions: Assess: immobility, friction, shearing, incontinence upon admission and as needed Assess offloading mechanisms upon admission and as needed Assess potential for pressure ulcer upon admission and as needed Notes: Wound/Skin Impairment Nursing Diagnoses: Impaired tissue integrity Goals: Patient/caregiver will verbalize understanding of skin care regimen Date Initiated: 03/13/2023 Target Resolution Date: 06/26/2023 Goal Status: Active Interventions: Assess ulceration(s) every visit Treatment Activities: Skin care regimen initiated : 03/13/2023 Notes: Electronic Signature(s) Signed: 04/20/2023 12:18:37 PM By: Zenaida Deed RN, BSN Entered By: Zenaida Deed on 04/20/2023  06:22:57 -------------------------------------------------------------------------------- Pain Assessment Details Patient Name: Date of Service: [name] [name], [name] 04/20/2023 9:15 A M Medical Record Number: [id number] Patient Account Number: [id number] Date of Birth/Sex: Treating RN: 11/26/1960 (61 y.o. Tommye Standard Primary Care Lachrista Heslin: PCP, NO Other Clinician: Referring Abisola Carrero: Treating Summit Borchardt/Extender: Cleophus Molt in Treatment: 5 Active Problems Location of Pain Severity and Description of Pain Patient Has Paino No Site Locations Boylan, Kariann L ([id number]) [phone].pdf Page 6 of 11 Pain Management and Medication Current Pain Management: Electronic Signature(s) Signed: 04/20/2023 11:28:18 AM By: Dayton Scrape Signed: 04/20/2023 12:18:37 PM By: Zenaida Deed RN, BSN Entered By: Dayton Scrape on 04/20/2023 06:23:29 -------------------------------------------------------------------------------- Patient/Caregiver Education Details Patient Name: Date of Service: [name] [name], [name] 10/25/2024andnbsp9:15 A M Medical Record Number: [id number] Patient Account Number: [id number] Date of Birth/Gender: Treating RN: 10/18/1960 (61 y.o. Tommye Standard Primary Care Physician: PCP, NO Other Clinician: Referring Physician: Treating Physician/Extender: Cleophus Molt in Treatment: 5 Education Assessment Education Provided To: Patient Education Topics Provided Pressure: Methods: Explain/Verbal Responses: Reinforcements needed, State content correctly Wound/Skin Impairment: Methods: Explain/Verbal Responses: Reinforcements needed, State content correctly Electronic Signature(s) Signed: 04/20/2023 12:18:37 PM By: Zenaida Deed RN, BSN Entered By: Zenaida Deed on 04/20/2023 22:02:54 Wound Assessment  Details -------------------------------------------------------------------------------- Vangilder, Brooks L ([id number]) [id number] Page 7 of 11 Patient Name: Date of Service: [facility], [name] 04/20/2023 9:15 A M Medical Record Number: [id number] Patient Account Number: [id number] Date of Birth/Sex: Treating RN: 03-25-1961 (61 y.o. [name] Primary Care Girolamo Lortie: PCP, NO Other Clinician: Referring Tessia Kassin: Treating Uriel Dowding/Extender: Cleophus Molt in Treatment: 5  Koehne, Elie L ([id number]) [id number] Page 1 of 11 Visit Report for 04/20/2023 Arrival Information Details Patient Name: Date of Service: Mayer, A [place] L. 04/20/2023 9:15 A M Medical Record Number: [id number] Patient Account Number: [id number] Date of Birth/Sex: Treating RN: 02-Apr-1961 (61 y.o. F) Primary Care Marvette Schamp: PCP, NO Other Clinician: Referring Nancyjo Givhan: Treating Verdean Murin/Extender: Cleophus Molt in Treatment: 5 Visit Information History Since Last Visit Added or deleted any medications: No Patient Arrived: Wheel Chair Any new allergies or adverse reactions: No Arrival Time: 09:22 Had a fall or experienced change in No Accompanied By: aide activities of daily living that may affect Transfer Assistance: None risk of falls: Patient Identification Verified: Yes Signs or symptoms of abuse/neglect since last visito No Secondary Verification Process Completed: Yes Hospitalized since last visit: No Implantable device outside of the clinic excluding No cellular tissue based products placed in the center since last visit: Has Dressing in Place as Prescribed: Yes Pain Present Now: No Electronic Signature(s) Signed: 04/20/2023 12:18:37 PM By: Zenaida Deed RN, BSN Entered By: Zenaida Deed on 04/20/2023 07:11:22 -------------------------------------------------------------------------------- Encounter Discharge Information Details Patient Name: Date of Service: [facility], [name] 04/20/2023 9:15 A M Medical Record Number: [id number] Patient Account Number: [id number] Date of Birth/Sex: Treating RN: [birth year] (61 y.o. Fredderick Phenix Primary Care Charitie Hinote: PCP, NO Other Clinician: Referring Teiara Baria: Treating Robbie Rideaux/Extender: Cleophus Molt in Treatment: 5 Encounter Discharge Information Items Post Procedure Vitals Discharge Condition: Stable Temperature (F): 98.3 Ambulatory  Status: Wheelchair Pulse (bpm): 112 Discharge Destination: Skilled Nursing Facility Respiratory Rate (breaths/min): 18 Telephoned: No Blood Pressure (mmHg): 124/85 Orders Sent: Yes Transportation: Private Auto Accompanied By: caregiver Schedule Follow-up Appointment: Yes Clinical Summary of Care: Patient Declined Electronic Signature(s) Signed: 04/20/2023 11:55:59 AM By: Samuella Bruin Entered By: Samuella Bruin on 04/20/2023 07:37:05 Happ, Buffey L ([id number]) [id number] Page 2 of 11 -------------------------------------------------------------------------------- Lower Extremity Assessment Details Patient Name: Date of Service: [name] [name], [name] 04/20/2023 9:15 A M Medical Record Number: [id number] Patient Account Number: [id number] Date of Birth/Sex: Treating RN: Jun 27, 1960 (61 y.o. [name] Primary Care Mykah Shin: PCP, NO Other Clinician: Referring Larell Baney: Treating Moksha Dorgan/Extender: Cleophus Molt in Treatment: 5 Edema Assessment Assessed: Kyra Searles: Yes] Franne Forts: Yes] [Left: Edema] [Right: :] Electronic Signature(s) Signed: 04/20/2023 2:33:15 PM By: Karie Schwalbe RN Entered By: Karie Schwalbe on 04/20/2023 06:34:41 -------------------------------------------------------------------------------- Multi Wound Chart Details Patient Name: Date of Service: [name] [name], [name] 04/20/2023 9:15 A M Medical Record Number: [id number] Patient Account Number: [id number] Date of Birth/Sex: Treating RN: 1960/08/24 (61 y.o. Tommye Standard Primary Care Katera Rybka: PCP, NO Other Clinician: Referring Rhen Kawecki: Treating Eladia Frame/Extender: Cleophus Molt in Treatment: 5 Vital Signs Height(in): 67 Pulse(bpm): 112 Weight(lbs): 110 Blood Pressure(mmHg): 124/85 Body Mass Index(BMI): 17.2 Temperature(F): 98.3 Respiratory Rate(breaths/min): 18 [4:Photos:] Right Groin Left Trochanter  Sacrum Wound Location: Skin Tear/Laceration Trauma Pressure Injury Wounding Event: MASD Abrasion Pressure Ulcer Primary Etiology: Anemia, Gout, Osteomyelitis Anemia, Gout, Osteomyelitis Anemia, Gout, Osteomyelitis Comorbid History: 01/09/2023 03/08/2023 12/21/2021 Date Acquired: 5 5 5  Weeks of Treatment: Open Open Open Wound Status: No No No Wound Recurrence: Yes No No Clustered Wound: 3.8x1.8x0.6 2.1x1x0.4 0.6x0.7x0.4 Measurements L x W x D (cm) 5.372 1.649 0.33 A (cm) : rea 3.223 0.66 0.132 Volume (cm) : 67.10% -199.80% 61.80% % Reduction in A rea: 83.60% -1100.00% 49.00% % Reduction in Volume: Full Thickness Without Exposed Full Thickness Without Exposed Category/Stage II Classification: Support Structures Support Structures Large Medium Medium  Abnormalities Noted: Yes Treatment Notes Wound #5 (Trochanter) Wound Laterality: Left Cleanser Soap and Water Discharge Instruction: May shower and wash wound with dial antibacterial soap and water prior to dressing change. Vashe 5.8 (oz) Discharge Instruction: Cleanse the wound with Vashe prior to applying a clean dressing using gauze sponges, not tissue or cotton balls. Wound Cleanser Discharge Instruction: Cleanse the wound with wound cleanser prior to applying a clean dressing using gauze sponges, not tissue or cotton balls. Peri-Wound Care Topical Santyl Collagenase Ointment, 30 (gm), tube Primary Dressing Maxorb Extra Ag+ Alginate Dressing, 2x2 (in/in) Discharge Instruction: Apply to wound bed as instructed Secondary  Dressing Bordered Gauze, 2x3.75 in Discharge Instruction: Apply over primary dressing as directed. Secured With Compression Wrap Compression Stockings Add-Ons Electronic Signature(s) Signed: 04/20/2023 11:28:18 AM By: Dayton Scrape Signed: 04/20/2023 2:33:15 PM By: Karie Schwalbe RN Entered By: Dayton Scrape on 04/20/2023 06:52:08 -------------------------------------------------------------------------------- Wound Assessment Details Patient Name: Date of Service: [name] [name], [name] 04/20/2023 9:15 A M Medical Record Number: [id number] Patient Account Number: [id number] Wigger, Rim L ([id number]) [phone].pdf Page 10 of 11 Date of Birth/Sex: Treating RN: 07/15/1960 (62 y.o. [name] Primary Care Clarabelle Oscarson: Other Clinician: PCP, NO Referring Alana Dayton: Treating Lindsi Bayliss/Extender: Cleophus Molt in Treatment: 5 Wound Status Wound Number: 6 Primary Etiology: Pressure Ulcer Wound Location: Sacrum Wound Status: Open Wounding Event: Pressure Injury Comorbid History: Anemia, Gout, Osteomyelitis Date Acquired: 12/21/2021 Weeks Of Treatment: 5 Clustered Wound: No Photos Wound Measurements Length: (cm) 0.6 Width: (cm) 0.7 Depth: (cm) 0.4 Area: (cm) 0.33 Volume: (cm) 0.132 % Reduction in Area: 61.8% % Reduction in Volume: 49% Epithelialization: Small (1-33%) Tunneling: No Undermining: No Wound Description Classification: Category/Stage II Wound Margin: Distinct, outline attached Exudate Amount: Medium Exudate Type: Serosanguineous Exudate Color: red, brown Foul Odor After Cleansing: No Slough/Fibrino Yes Wound Bed Granulation Amount: Large (67-100%) Exposed Structure Granulation Quality: Red Fat Layer (Subcutaneous Tissue) Exposed: Yes Necrotic Amount: Small (1-33%) Necrotic Quality: Adherent Slough Periwound Skin Texture Texture Color No Abnormalities Noted: Yes No Abnormalities Noted: No Moisture Temperature  / Pain No Abnormalities Noted: Yes Temperature: No Abnormality Treatment Notes Wound #6 (Sacrum) Cleanser Soap and Water Discharge Instruction: May shower and wash wound with dial antibacterial soap and water prior to dressing change. Vashe 5.8 (oz) Discharge Instruction: Cleanse the wound with Vashe prior to applying a clean dressing using gauze sponges, not tissue or cotton balls. Wound Cleanser Discharge Instruction: Cleanse the wound with wound cleanser prior to applying a clean dressing using gauze sponges, not tissue or cotton balls. Peri-Wound Care Topical Primary Dressing Maxorb Extra Ag+ Alginate Dressing, 2x2 (in/in) Discharge Instruction: Apply to wound bed as instructed Lanting, Shelbey L ([id number]) [id number].pdf Page 11 of 11 Secondary Dressing Bordered Gauze, 2x3.75 in Discharge Instruction: Apply over primary dressing as directed. Secured With Compression Wrap Compression Stockings Add-Ons Electronic Signature(s) Signed: 04/20/2023 11:28:18 AM By: Dayton Scrape Signed: 04/20/2023 2:33:15 PM By: Karie Schwalbe RN Entered By: [place] Scrape on 04/20/2023 06:52:56 -------------------------------------------------------------------------------- Vitals Details Patient Name: Date of Service: [name] [name], [name] 04/20/2023 9:15 A M Medical Record Number: [id number] Patient Account Number: [id number] Date of Birth/Sex: Treating RN: 03/29/1961 (62 y.o. F) Primary Care Shown Dissinger: PCP, NO Other Clinician: Referring Takashi Korol: Treating Arye Weyenberg/Extender: Cleophus Molt in Treatment: 5 Vital Signs Time Taken: 09:23 Temperature (F): 98.3 Height (in): 67 Pulse (bpm): 112 Weight (lbs): 110 Respiratory Rate (breaths/min): 18 Body Mass Index (BMI): 17.2 Blood Pressure (mmHg): 124/85 Reference Range: 80 - 120 mg /

## 2023-04-20 NOTE — Progress Notes (Signed)
encephalopathy, malnutrition Hematologic/Lymphatic Medical History: Positive for: Anemia Past Medical History Notes: vitamin D deficiency Respiratory Medical History: Past Medical History Notes: pulmonary embolism Cardiovascular Medical History: Past Medical History Notes: CVA 2001 Integumentary (Skin) Medical History: Past Medical History Notes: cellulitis Musculoskeletal Medical History: Positive for: Gout; Osteomyelitis Past Medical History Notes: osteoporosis, contracted Neurologic Medical History: Past Medical History Notes: CVA, chronic pain Psychiatric [name] [name], [name] [name] ([id number]) [id number] Page 11 of 12 Medical History: Past Medical History Notes: Bipolar disorder, anxiety, depression, tobacco abuse, alcohol abuse Immunizations Pneumococcal Vaccine: Received Pneumococcal Vaccination: No Implantable Devices No devices added Hospitalization / Surgery History Type of Hospitalization/Surgery Partial hip arthroplasty (Right) T hip arthroplasty (Left) otal Appendectomy Family and Social History Unknown History: Yes; Current some day smoker; Marital Status - Single; Alcohol Use: Never - Hx: ETOH; Drug Use: No History; Caffeine Use: Rarely; Financial Concerns: No; Food, Clothing or Shelter Needs: No; Support System Lacking: No; Transportation Concerns: No Psychologist, prison and probation services) Signed: 04/20/2023 12:18:37 PM By: Zenaida Deed RN, BSN Signed: 04/20/2023 12:25:45 PM By: Duanne Guess MD FACS Entered By: Duanne Guess on 04/20/2023 10:50:48 -------------------------------------------------------------------------------- SuperBill Details Patient Name: Date of Service: [facility], [name] NN [name]. 04/20/2023 Medical Record Number: [id number] Patient Account Number: [id number] Date of Birth/Sex: Treating RN: May 04, 1961 (61 y.o. 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, sacral and sacrococcygeal region Facility Procedures : CPT4 Code: [id number] Description: 11042 - DEB SUBQ TISSUE 20 SQ CM/< ICD-10 Diagnosis Description L89.223 Pressure ulcer of left hip, stage 3 Modifier: Quantity: 1 : CPT4 Code: 42595638 Description: 97597 - DEBRIDE WOUND 1ST 20 SQ CM OR < ICD-10 Diagnosis Description L89.154 Pressure ulcer of sacral region, stage 4 Modifier: Quantity: 1 : [place], CPT4 Code: [id number] Sarabi [name] ([id number]) Description: 17250 - CHEM CAUT GRANULATION TISS ICD-10 Diagnosis Description 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 [phone] Modifier: [id number] Quantity: 1 df Page 12 of 12 Physician Procedures : CPT4 Code Description Modifier [id number] 99214 - WC PHYS LEVEL 4 - EST PT ICD-10 Diagnosis Description L89.154 Pressure ulcer of sacral region, 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 L89.223 Pressure ulcer of  left hip,  stage 3 L89.321 Pressure ulcer of left buttock, stage 1 Quantity: 1 : [id number] 11042 - WC PHYS SUBQ TISS 20 SQ CM ICD-10 Diagnosis Description L89.223 Pressure ulcer of left hip, stage 3 Quantity: 1 : [id number] 97597 - WC PHYS DEBR WO ANESTH 20 SQ CM ICD-10 Diagnosis Description L89.154 Pressure ulcer of sacral region, stage 4 Quantity: 1 : [id number] 17250 - WC PHYS CHEM CAUT GRAN TISSUE ICD-10 Diagnosis Description 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 Cleanser: Vashe 5.8 (oz) 1 x Per Day/30 Days Discharge Instructions: Cleanse the wound with Vashe prior to applying [name] clean dressing using gauze sponges, not tissue or cotton balls. Cleanser: Wound Cleanser 1 x Per Day/30 Days Discharge Instructions: Cleanse the wound with wound cleanser prior to applying [name] clean dressing using gauze sponges, not tissue or cotton balls. Topical: Santyl Collagenase Ointment, 30 (gm), tube 1 x Per Day/30 Days Prim Dressing: Maxorb Extra Ag+ Alginate Dressing, 2x2 (in/in) 1 x Per Day/30 Days ary Discharge Instructions: Apply to wound bed as instructed Secondary Dressing: Bordered Gauze, 2x3.75 in 1 x Per Day/30 Days Discharge Instructions: Apply over primary dressing as directed. WOUND #6: - Sacrum Wound Laterality: Cleanser: Soap and Water 1 x Per Day/30 Days Discharge Instructions: May shower and wash wound with dial antibacterial soap and water prior to dressing change. Cleanser: Vashe 5.8 (oz) 1 x Per Day/30 Days Discharge Instructions: Cleanse the wound with Vashe prior to applying [name] clean dressing using gauze sponges, not tissue or cotton balls. Cleanser: Wound Cleanser 1 x Per Day/30 Days Discharge Instructions: Cleanse the wound with wound cleanser prior to applying [name] clean  dressing using gauze sponges, not tissue or cotton balls. Prim Dressing: Maxorb Extra Ag+ Alginate Dressing, 2x2 (in/in) 1 x Per Day/30 Days ary Discharge Instructions: Apply to wound bed as instructed Secondary Dressing: Bordered Gauze, 2x3.75 in 1 x Per Day/30 Days Discharge Instructions: Apply over primary dressing as directed. 04/20/2023: The left trochanter ulcer has deteriorated even further. It is clearly [name] stage III now with necrotic tissue present. The sacral ulcer has gotten [name] little bit larger. She has 2 new stage I pressure injuries, 1 on her left ischium and 1 on the left sacral ala. The groin wound is still very friable and bleeds freely, but the open area does seem perhaps [name] little bit smaller, as far as it can be assessed, given the patient's contractures. She still has [name] strong fungal odor coming from the groin and perineum area. [name] [name], [name] [name] ([id number]) [id number] Page 10 of 12 I used [name] curette to debride slough from the sacral ulcer and slough and subcutaneous tissue from the left trochanter ulcer. I used silver nitrate to chemically cauterize the friable tissue in her right groin. Will continue silver alginate to the sacrum and groin along with nystatin powder and Interdry pads in the groin. We are going to add Santyl to the trochanter wound dressing to try and clean out the rest of the necrotic tissue. [name] note was written to her care facility to carefully monitor the areas where she is sustaining new pressure injuries; the patient is on [name] low-air-loss mattress but the way her contractures contort her body, her preferential side for recumbency is the left, subjecting these bony areas to risk of injury. Follow-up in 2 weeks. Electronic Signature(s) Signed: 04/20/2023 10:53:29 AM By: Duanne Guess MD FACS Entered By: Duanne Guess on 04/20/2023 10:53:29 -------------------------------------------------------------------------------- HxROS  Details Patient Name: Date of Service: [name] [name], [name] NN [name]. 04/20/2023 9:15 [name] M Medical Record Number: [id number] Patient Account Number: [id number] Date of Birth/Sex: Treating RN: Aug 18, 1960 (61 y.o. [name] [name] Primary Care Provider: PCP, NO Other Clinician: Referring Provider: Treating Provider/Extender: [name] [name] in Treatment: 5 Information Obtained From Patient Chart Constitutional Symptoms (General Health) Medical History: Past Medical History Notes: Acute metabolic  Furgerson, Dove [name] ([id number]) [id number] Page 1 of 12 Visit Report for 04/20/2023 Chief Complaint Document Details Patient Name: Date of Service: [facility], [name] NN [name]. 04/20/2023 9:15 [name] M Medical Record Number: [id number] Patient Account Number: [id number] Date of Birth/Sex: Treating RN: 1960-06-28 (61 y.o. [name] [name] Primary Care Provider: PCP, NO Other Clinician: Referring Provider: Treating Provider/Extender: [name] [name] in Treatment: 5 Information Obtained from: Patient Chief Complaint Patient is at the clinic for treatment of multiple open pressure ulcers Electronic Signature(s) Signed: 04/20/2023 10:42:57 AM By: Duanne Guess MD FACS Entered By: Duanne Guess on 04/20/2023 10:42:57 -------------------------------------------------------------------------------- Debridement Details Patient Name: Date of Service: [name] [name], [name] NN [name]. 04/20/2023 9:15 [name] M Medical Record Number: [id number] Patient Account Number: [id number] Date of Birth/Sex: Treating RN: October 11, 1960 (61 y.o. [name] [name] Primary Care Provider: PCP, NO Other Clinician: Referring Provider: Treating Provider/Extender: [name] [name] in Treatment: 5 Debridement Performed for Assessment: Wound #6 Sacrum Performed By: Physician Duanne Guess, MD The following information was scribed by: Zenaida Deed The information was scribed for: Duanne Guess Debridement Type: Debridement Level of Consciousness (Pre-procedure): Awake and Alert Pre-procedure Verification/Time Out Yes - 10:10 Taken: Start Time: 10:11 Pain Control: Lidocaine 4% T opical Solution Percent of Wound Bed Debrided: 100% T Area Debrided (cm): otal 0.33 Tissue and other material debrided: Non-Viable, Slough, Slough Level: Non-Viable Tissue Debridement Description: Selective/Open Wound Instrument: Curette Bleeding: Minimum Hemostasis Achieved:  Pressure Procedural Pain: 2 Post Procedural Pain: 1 Response to Treatment: Procedure was tolerated well Level of Consciousness (Post- Awake and Alert procedure): Post Debridement Measurements of Total Wound Length: (cm) 0.6 Stage: Category/Stage II Width: (cm) 0.7 Depth: (cm) 0.1 Volume: (cm) 0.033 Character of Wound/Ulcer Post Debridement: Improved Juliana, Julyanna [name] ([id number]) [id number] Page 2 of 12 Post Procedure Diagnosis Same as Pre-procedure Electronic Signature(s) Signed: 04/20/2023 12:18:37 PM By: Zenaida Deed RN, BSN Signed: 04/20/2023 12:25:45 PM By: Duanne Guess MD FACS Entered By: Zenaida Deed on 04/20/2023 10:13:22 -------------------------------------------------------------------------------- Debridement Details Patient Name: Date of Service: [name] [name], [name] NN [name]. 04/20/2023 9:15 [name] M Medical Record Number: [id number] Patient Account Number: [id number] Date of Birth/Sex: Treating RN: March 11, 1961 (61 y.o. [name] [name] Primary Care Provider: PCP, NO Other Clinician: Referring Provider: Treating Provider/Extender: [name] [name] in Treatment: 5 Debridement Performed for Assessment: Wound #5 Left Trochanter Performed By: Physician Duanne Guess, MD The following information was scribed by: Zenaida Deed The information was scribed for: Duanne Guess Debridement Type: Debridement Level of Consciousness (Pre-procedure): Awake and Alert Pre-procedure Verification/Time Out Yes - 10:10 Taken: Start Time: 10:11 Pain Control: Lidocaine 4% T opical Solution Percent of Wound Bed Debrided: 100% T Area Debrided (cm): otal 0.66 Tissue and other material debrided: Non-Viable, Slough, Subcutaneous, Slough Level: Skin/Subcutaneous Tissue Debridement Description: Excisional Instrument: Curette Bleeding: Minimum Hemostasis Achieved: Pressure Procedural Pain: 5 Post Procedural Pain: 3 Response to  Treatment: Procedure was tolerated well Level of Consciousness (Post- Awake and Alert procedure): Post Debridement Measurements of Total Wound Length: (cm) 2.1 Width: (cm) 0.4 Depth: (cm) 0.4 Volume: (cm) 0.264 Character of Wound/Ulcer Post Debridement: Improved Post Procedure Diagnosis Same as Pre-procedure Electronic Signature(s) Signed: 04/20/2023 12:18:37 PM By: Zenaida Deed RN, BSN Signed: 04/20/2023 12:25:45 PM By: Duanne Guess MD FACS Entered By: Zenaida Deed on 04/20/2023 10:14:23 HPI Details -------------------------------------------------------------------------------- Hawe, Chastin [name] ([id number]) [id number] Page 3 of 12 Patient Name: Date of Service: [facility] [name] P, [name] NN [name]. 04/20/2023 9:15 [name] M Medical Record Number: [id number] Patient Account Number: [id number]  [id number]) [id number] Page 6 of 12 Prim Dressing: Maxorb Extra Ag+ Alginate Dressing, 2x2 (in/in) 1 x Per Day/30 Days ary Discharge Instructions: Apply to wound bed as instructed Secondary Dressing: Bordered Gauze, 2x3.75 in 1 x Per Day/30 Days Discharge Instructions: Apply over primary dressing as directed. Electronic Signature(s) Signed: 04/20/2023 12:25:45 PM By: Duanne Guess MD FACS Entered By: Duanne Guess on 04/20/2023 10:51:31 -------------------------------------------------------------------------------- Problem List Details Patient Name: Date of Service: [name] [name], [name] NN [name]. 04/20/2023 9:15 [name] M Medical Record Number: [id number] Patient Account Number: [id number] Date of Birth/Sex: Treating RN: 12-Mar-1961 (61 y.o. 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 04/20/2023 9:15 [name] M Medical Record Number: [id number] Patient Account Number: [id number] Date of Birth/Sex: Treating RN: 10-25-1960 (61 y.o. 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 I am unclear as to how long her sacral pressure ulcer has been present, but there is bone exposed and apparently she was having more drainage from the site. She has also developed [name] stage III pressure ulcer on her left lateral ankle and [name] stage II pressure ulcer on her left medial ankle. She is not diabetic, but does have [name] smoking history and continues to smoke about half [name] pack [name] day. ABIs were performed and were within normal limits. Antibiotics were prescribed for [name] total course of 6 weeks. She has been followed by  infectious disease and last saw them on December 20, 2021. Infectious disease referred her to the wound care center for evaluation and management. The patient resists frequent turning because of chronic pain issues. The aide from her facility states that she is on [name] low-air-loss mattress. The patient says she has not been permitting dressing changes for the past few days because of her pain. The dressings that were removed from her sacral wound were putrid. She does state that she drinks 2-3 protein shakes such as boost or Ensure daily and she is also receiving Pro-Stat at her facility. 01/16/2022: She has not received [name] level 3 surface and is still on [name] level 2. She says she is drinking protein shakes; she has [name] Boost with her today but it only has 10 g of  Soap and Water 1 x Per Day/30 Days Discharge Instructions: May shower and wash wound with dial antibacterial soap and water prior to dressing change. Cleanser: Vashe 5.8 (oz) 1 x Per Day/30 Days Discharge Instructions: Cleanse the wound with Vashe prior to applying [name] clean dressing using gauze sponges, not tissue or cotton balls. Cleanser: Wound Cleanser 1 x Per Day/30 Days Discharge Instructions: Cleanse the wound with wound cleanser prior to applying [name] clean dressing using gauze sponges, not tissue or cotton balls. Topical: Santyl Collagenase Ointment, 30 (gm), tube 1 x Per Day/30 Days Prim Dressing: Maxorb Extra Ag+ Alginate Dressing, 2x2 (in/in) 1 x Per Day/30 Days ary Discharge Instructions: Apply to wound bed as instructed Secondary Dressing: Bordered Gauze, 2x3.75 in 1 x Per Day/30 Days Discharge Instructions: Apply over primary dressing as directed. WOUND #6: - Sacrum Wound Laterality: Cleanser: Soap and Water 1 x Per Day/30 Days Discharge Instructions: May shower and wash wound with dial antibacterial soap and water prior to dressing change. Cleanser: Vashe 5.8 (oz) 1 x Per Day/30 Days Discharge Instructions: Cleanse the wound with Vashe prior to applying [name] clean dressing using gauze sponges, not tissue or cotton balls. Cleanser: Wound Cleanser 1 x Per Day/30 Days Discharge Instructions: Cleanse the wound with wound cleanser prior to applying [name] clean  dressing using gauze sponges, not tissue or cotton balls. Prim Dressing: Maxorb Extra Ag+ Alginate Dressing, 2x2 (in/in) 1 x Per Day/30 Days ary Discharge Instructions: Apply to wound bed as instructed Secondary Dressing: Bordered Gauze, 2x3.75 in 1 x Per Day/30 Days Discharge Instructions: Apply over primary dressing as directed. 04/20/2023: The left trochanter ulcer has deteriorated even further. It is clearly [name] stage III now with necrotic tissue present. The sacral ulcer has gotten [name] little bit larger. She has 2 new stage I pressure injuries, 1 on her left ischium and 1 on the left sacral ala. The groin wound is still very friable and bleeds freely, but the open area does seem perhaps [name] little bit smaller, as far as it can be assessed, given the patient's contractures. She still has [name] strong fungal odor coming from the groin and perineum area. [name] [name], [name] [name] ([id number]) [id number] Page 10 of 12 I used [name] curette to debride slough from the sacral ulcer and slough and subcutaneous tissue from the left trochanter ulcer. I used silver nitrate to chemically cauterize the friable tissue in her right groin. Will continue silver alginate to the sacrum and groin along with nystatin powder and Interdry pads in the groin. We are going to add Santyl to the trochanter wound dressing to try and clean out the rest of the necrotic tissue. [name] note was written to her care facility to carefully monitor the areas where she is sustaining new pressure injuries; the patient is on [name] low-air-loss mattress but the way her contractures contort her body, her preferential side for recumbency is the left, subjecting these bony areas to risk of injury. Follow-up in 2 weeks. Electronic Signature(s) Signed: 04/20/2023 10:53:29 AM By: Duanne Guess MD FACS Entered By: Duanne Guess on 04/20/2023 10:53:29 -------------------------------------------------------------------------------- HxROS  Details Patient Name: Date of Service: [name] [name], [name] NN [name]. 04/20/2023 9:15 [name] M Medical Record Number: [id number] Patient Account Number: [id number] Date of Birth/Sex: Treating RN: Aug 18, 1960 (61 y.o. [name] [name] Primary Care Provider: PCP, NO Other Clinician: Referring Provider: Treating Provider/Extender: [name] [name] in Treatment: 5 Information Obtained From Patient Chart Constitutional Symptoms (General Health) Medical History: Past Medical History Notes: Acute metabolic  There is also bleeding coming from the open wound at the very depths of her contracted hip area. She does say that she is not wearing [name] brief at her facility, but she did wear 1 today, presumably for transport. 04/20/2023: The left trochanter ulcer has deteriorated even further. It is clearly [name] stage III now with necrotic tissue present. The sacral ulcer has gotten [name] little bit larger. She has 2 new stage I pressure injuries, 1 on her left ischium and 1 on the left sacral ala. The groin wound is still very friable and bleeds freely, but the open area does seem perhaps [name] little bit smaller, as far as it can be assessed, given the patient's contractures. She still has [name] strong fungal odor coming from the groin and perineum area. Electronic Signature(s) Signed: 04/20/2023 10:50:42 AM By: Duanne Guess MD FACS Previous Signature: 04/20/2023 10:49:35 AM Version By: Duanne Guess MD FACS Previous Signature: 04/20/2023 10:44:09 AM Version By: Duanne Guess MD FACS Entered By: Duanne Guess on 04/20/2023 10:50:42 -------------------------------------------------------------------------------- Chemical Cauterization Details Patient Name: Date of Service: [name] [name], [name] NN [name]. 04/20/2023 9:15 [name] M Medical Record Number: [id number] Patient Account Number: [id number] Date of Birth/Sex: Treating RN: 12/21/1960 (61 y.o. [name] [name] Primary Care Provider: PCP, NO Other Clinician: Referring Provider: Treating Provider/Extender: [name] [name] in Treatment: 5 Procedure Performed for: Wound  #4 Right Groin Performed By: Physician Duanne Guess, MD The following information was scribed by: Zenaida Deed The information was scribed for: Duanne Guess Post Procedure Diagnosis Same as Pre-procedure Notes using silver nitrate sticks Vandewater, Batool [name] ([id number]) [id number] Page 4 of 12 Electronic Signature(s) Signed: 04/20/2023 12:18:37 PM By: Zenaida Deed RN, BSN Signed: 04/20/2023 12:25:45 PM By: Duanne Guess MD FACS Entered By: Zenaida Deed on 04/20/2023 10:16:20 -------------------------------------------------------------------------------- Physical Exam Details Patient Name: Date of Service: [name] [name], [name] NN [name]. 04/20/2023 9:15 [name] M Medical Record Number: [id number] Patient Account Number: [id number] Date of Birth/Sex: Treating RN: [birth year] (61 y.o. [name] [name] Primary Care Provider: PCP, NO Other Clinician: Referring Provider: Treating Provider/Extender: [name] [name] in Treatment: 5 Constitutional .Tachycardic, asymptomatic. . . no acute distress. Respiratory Normal work of breathing on room air.. Notes 04/20/2023: The left trochanter ulcer has deteriorated even further. It is clearly [name] stage III now with necrotic tissue present. The sacral ulcer has gotten [name] little bit larger. She has 2 new stage I pressure injuries, 1 on her left ischium and 1 on the left sacral ala. The groin wound is still very friable and bleeds freely, but the open area does seem perhaps [name] little bit smaller, as far as it can be assessed, given the patient's contractures. She still has [name] strong fungal odor coming from the groin and perineum area. Electronic Signature(s) Signed: 04/20/2023 10:51:11 AM By: Duanne Guess MD FACS Entered By: Duanne Guess on 04/20/2023 10:51:11 -------------------------------------------------------------------------------- Physician Orders Details Patient Name: Date of  Service: [name] [name], [name] NN [name]. 04/20/2023 9:15 [name] M Medical Record Number: [id number] Patient Account Number: [id number] Date of Birth/Sex: Treating RN: 11/24/1960 (61 y.o. 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 Electronic Signature(s) Signed: 04/20/2023 12:25:45 PM By: Duanne Guess MD FACS Entered By: Duanne Guess on 04/20/2023 10:51:31 -------------------------------------------------------------------------------- Problem List Details Patient Name: Date of Service: [name] [name], [name] NN [name]. 04/20/2023 9:15 [name] M Medical Record Number: [id number] Patient Account Number: [id number] Date of Birth/Sex: Treating RN: 12-Mar-1961 (61 y.o. 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 04/20/2023 9:15 [name] M Medical Record Number: [id number] Patient Account Number: [id number] Date of Birth/Sex: Treating RN: 10-25-1960 (61 y.o. 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 I am unclear as to how long her sacral pressure ulcer has been present, but there is bone exposed and apparently she was having more drainage from the site. She has also developed [name] stage III pressure ulcer on her left lateral ankle and [name] stage II pressure ulcer on her left medial ankle. She is not diabetic, but does have [name] smoking history and continues to smoke about half [name] pack [name] day. ABIs were performed and were within normal limits. Antibiotics were prescribed for [name] total course of 6 weeks. She has been followed by  infectious disease and last saw them on December 20, 2021. Infectious disease referred her to the wound care center for evaluation and management. The patient resists frequent turning because of chronic pain issues. The aide from her facility states that she is on [name] low-air-loss mattress. The patient says she has not been permitting dressing changes for the past few days because of her pain. The dressings that were removed from her sacral wound were putrid. She does state that she drinks 2-3 protein shakes such as boost or Ensure daily and she is also receiving Pro-Stat at her facility. 01/16/2022: She has not received [name] level 3 surface and is still on [name] level 2. She says she is drinking protein shakes; she has [name] Boost with her today but it only has 10 g of  Soap and Water 1 x Per Day/30 Days Discharge Instructions: May shower and wash wound with dial antibacterial soap and water prior to dressing change. Cleanser: Vashe 5.8 (oz) 1 x Per Day/30 Days Discharge Instructions: Cleanse the wound with Vashe prior to applying [name] clean dressing using gauze sponges, not tissue or cotton balls. Cleanser: Wound Cleanser 1 x Per Day/30 Days Discharge Instructions: Cleanse the wound with wound cleanser prior to applying [name] clean dressing using gauze sponges, not tissue or cotton balls. Topical: Santyl Collagenase Ointment, 30 (gm), tube 1 x Per Day/30 Days Prim Dressing: Maxorb Extra Ag+ Alginate Dressing, 2x2 (in/in) 1 x Per Day/30 Days ary Discharge Instructions: Apply to wound bed as instructed Secondary Dressing: Bordered Gauze, 2x3.75 in 1 x Per Day/30 Days Discharge Instructions: Apply over primary dressing as directed. WOUND #6: - Sacrum Wound Laterality: Cleanser: Soap and Water 1 x Per Day/30 Days Discharge Instructions: May shower and wash wound with dial antibacterial soap and water prior to dressing change. Cleanser: Vashe 5.8 (oz) 1 x Per Day/30 Days Discharge Instructions: Cleanse the wound with Vashe prior to applying [name] clean dressing using gauze sponges, not tissue or cotton balls. Cleanser: Wound Cleanser 1 x Per Day/30 Days Discharge Instructions: Cleanse the wound with wound cleanser prior to applying [name] clean  dressing using gauze sponges, not tissue or cotton balls. Prim Dressing: Maxorb Extra Ag+ Alginate Dressing, 2x2 (in/in) 1 x Per Day/30 Days ary Discharge Instructions: Apply to wound bed as instructed Secondary Dressing: Bordered Gauze, 2x3.75 in 1 x Per Day/30 Days Discharge Instructions: Apply over primary dressing as directed. 04/20/2023: The left trochanter ulcer has deteriorated even further. It is clearly [name] stage III now with necrotic tissue present. The sacral ulcer has gotten [name] little bit larger. She has 2 new stage I pressure injuries, 1 on her left ischium and 1 on the left sacral ala. The groin wound is still very friable and bleeds freely, but the open area does seem perhaps [name] little bit smaller, as far as it can be assessed, given the patient's contractures. She still has [name] strong fungal odor coming from the groin and perineum area. [name] [name], [name] [name] ([id number]) [id number] Page 10 of 12 I used [name] curette to debride slough from the sacral ulcer and slough and subcutaneous tissue from the left trochanter ulcer. I used silver nitrate to chemically cauterize the friable tissue in her right groin. Will continue silver alginate to the sacrum and groin along with nystatin powder and Interdry pads in the groin. We are going to add Santyl to the trochanter wound dressing to try and clean out the rest of the necrotic tissue. [name] note was written to her care facility to carefully monitor the areas where she is sustaining new pressure injuries; the patient is on [name] low-air-loss mattress but the way her contractures contort her body, her preferential side for recumbency is the left, subjecting these bony areas to risk of injury. Follow-up in 2 weeks. Electronic Signature(s) Signed: 04/20/2023 10:53:29 AM By: Duanne Guess MD FACS Entered By: Duanne Guess on 04/20/2023 10:53:29 -------------------------------------------------------------------------------- HxROS  Details Patient Name: Date of Service: [name] [name], [name] NN [name]. 04/20/2023 9:15 [name] M Medical Record Number: [id number] Patient Account Number: [id number] Date of Birth/Sex: Treating RN: Aug 18, 1960 (61 y.o. [name] [name] Primary Care Provider: PCP, NO Other Clinician: Referring Provider: Treating Provider/Extender: [name] [name] in Treatment: 5 Information Obtained From Patient Chart Constitutional Symptoms (General Health) Medical History: Past Medical History Notes: Acute metabolic  encephalopathy, malnutrition Hematologic/Lymphatic Medical History: Positive for: Anemia Past Medical History Notes: vitamin D deficiency Respiratory Medical History: Past Medical History Notes: pulmonary embolism Cardiovascular Medical History: Past Medical History Notes: CVA 2001 Integumentary (Skin) Medical History: Past Medical History Notes: cellulitis Musculoskeletal Medical History: Positive for: Gout; Osteomyelitis Past Medical History Notes: osteoporosis, contracted Neurologic Medical History: Past Medical History Notes: CVA, chronic pain Psychiatric [name] [name], [name] [name] ([id number]) [id number] Page 11 of 12 Medical History: Past Medical History Notes: Bipolar disorder, anxiety, depression, tobacco abuse, alcohol abuse Immunizations Pneumococcal Vaccine: Received Pneumococcal Vaccination: No Implantable Devices No devices added Hospitalization / Surgery History Type of Hospitalization/Surgery Partial hip arthroplasty (Right) T hip arthroplasty (Left) otal Appendectomy Family and Social History Unknown History: Yes; Current some day smoker; Marital Status - Single; Alcohol Use: Never - Hx: ETOH; Drug Use: No History; Caffeine Use: Rarely; Financial Concerns: No; Food, Clothing or Shelter Needs: No; Support System Lacking: No; Transportation Concerns: No Psychologist, prison and probation services) Signed: 04/20/2023 12:18:37 PM By: Zenaida Deed RN, BSN Signed: 04/20/2023 12:25:45 PM By: Duanne Guess MD FACS Entered By: Duanne Guess on 04/20/2023 10:50:48 -------------------------------------------------------------------------------- SuperBill Details Patient Name: Date of Service: [facility], [name] NN [name]. 04/20/2023 Medical Record Number: [id number] Patient Account Number: [id number] Date of Birth/Sex: Treating RN: May 04, 1961 (61 y.o. 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, sacral and sacrococcygeal region Facility Procedures : CPT4 Code: [id number] Description: 11042 - DEB SUBQ TISSUE 20 SQ CM/< ICD-10 Diagnosis Description L89.223 Pressure ulcer of left hip, stage 3 Modifier: Quantity: 1 : CPT4 Code: [id number] Description: 97597 - DEBRIDE WOUND 1ST 20 SQ CM OR < ICD-10 Diagnosis Description L89.154 Pressure ulcer of sacral region, stage 4 Modifier: Quantity: 1 : [place], CPT4 Code: [id number] Sarabi [name] ([id number]) Description: 17250 - CHEM CAUT GRANULATION TISS ICD-10 Diagnosis Description 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 [phone] Modifier: [email or web address] Quantity: 1 df Page 12 of 12 Physician Procedures : CPT4 Code Description Modifier [id number] 99214 - WC PHYS LEVEL 4 - EST PT ICD-10 Diagnosis Description L89.154 Pressure ulcer of sacral region, 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 L89.223 Pressure ulcer of  left hip,  stage 3 L89.321 Pressure ulcer of left buttock, stage 1 Quantity: 1 : [id number] 11042 - WC PHYS SUBQ TISS 20 SQ CM ICD-10 Diagnosis Description L89.223 Pressure ulcer of left hip, stage 3 Quantity: 1 : [id number] 97597 - WC PHYS DEBR WO ANESTH 20 SQ CM ICD-10 Diagnosis Description L89.154 Pressure ulcer of sacral region, stage 4 Quantity: 1 : [id number] 17250 - WC PHYS CHEM CAUT GRAN TISSUE ICD-10 Diagnosis Description 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 I am unclear as to how long her sacral pressure ulcer has been present, but there is bone exposed and apparently she was having more drainage from the site. She has also developed [name] stage III pressure ulcer on her left lateral ankle and [name] stage II pressure ulcer on her left medial ankle. She is not diabetic, but does have [name] smoking history and continues to smoke about half [name] pack [name] day. ABIs were performed and were within normal limits. Antibiotics were prescribed for [name] total course of 6 weeks. She has been followed by infectious disease and last saw  them on December 20, 2021. Infectious disease referred her to the wound care center for evaluation and management. The patient resists frequent turning because of chronic pain issues. The aide from her facility states that she is on [name] low-air-loss mattress. The patient says she has not been permitting dressing changes for the past few days because of her pain. The dressings that were removed from her sacral wound were putrid. She does state that she drinks 2-3 protein shakes such as boost or Ensure daily and she is also receiving Pro-Stat at her facility. 01/16/2022: She has not received [name] level 3 surface and is still on [name] level 2. She says she is drinking protein shakes; she has [name] Boost with her today but it only has 10 g of protein. The left medial ankle wound has [name] little bit of eschar and just [name] tiny open area. The left lateral ankle wound has hypertrophic granulation tissue. The sacral wound is perhaps [name] little bit smaller but still has bone frankly exposed. 02/06/2022: We are still working on getting her [name] level 3 mattress surface. Her pain has decreased and she is tolerating her dressing changes much better. The left medial ankle wound is healed. The left latera ankle wound has [name] lot of accumulated slough. The sacrum is [name] little smaller and the tunneling is not as deep. Bone is still exposed. Minimal slough accumulation. READMISSION 03/13/2023 The patient returns after over [name] years absence. She says that her nursing facility got [name] wound care nurse and that is what she did not return. She has been hospitalized multiple times since I saw her last for issues related to her wounds. Most recently, she was in the hospital at the beginning and middle of August due to bleeding from her right groin. She has open wounds on her inguinal crease at the top of her right thigh, but due to her severe contractures, these have been left essentially untreated. There is [name] strong fungal odor coming from her groin area.  She has [name] new ulcer on her left greater trochanter. It is no deeper than an abrasion and is clean. Her sacral ulcer is nearly healed with just [name] tiny residual open area. 04/04/2023: The ulcer on her left trochanter has gotten larger and deeper. There is [name] layer of leathery eschar on the surface. The sacral ulcer is stable. She still has [name] strong fungal odor coming from her groin area.

## 2023-05-04 ENCOUNTER — Ambulatory Visit (HOSPITAL_BASED_OUTPATIENT_CLINIC_OR_DEPARTMENT_OTHER): Payer: Medicaid Other | Admitting: General Surgery

## 2023-05-21 ENCOUNTER — Ambulatory Visit (HOSPITAL_BASED_OUTPATIENT_CLINIC_OR_DEPARTMENT_OTHER): Payer: Medicaid Other | Admitting: General Surgery

## 2023-05-27 DEATH — deceased

## 2023-06-21 ENCOUNTER — Encounter (HOSPITAL_BASED_OUTPATIENT_CLINIC_OR_DEPARTMENT_OTHER): Payer: Medicaid Other | Attending: General Surgery | Admitting: General Surgery
